# Patient Record
Sex: Male | Born: 1945 | Race: White | Hispanic: No | Marital: Married | Smoking: Never smoker
Health system: Southern US, Community
[De-identification: ages and names within clinical notes are randomized; demographics above are authoritative.]

## PROBLEM LIST (undated history)

## (undated) ENCOUNTER — Emergency Department (HOSPITAL_COMMUNITY): Admission: EM | Payer: Non-veteran care | Source: Home / Self Care

## (undated) DIAGNOSIS — G629 Polyneuropathy, unspecified: Secondary | ICD-10-CM

## (undated) DIAGNOSIS — M4800 Spinal stenosis, site unspecified: Secondary | ICD-10-CM

## (undated) DIAGNOSIS — F4310 Post-traumatic stress disorder, unspecified: Secondary | ICD-10-CM

## (undated) DIAGNOSIS — K8689 Other specified diseases of pancreas: Secondary | ICD-10-CM

## (undated) DIAGNOSIS — Z950 Presence of cardiac pacemaker: Secondary | ICD-10-CM

## (undated) DIAGNOSIS — J449 Chronic obstructive pulmonary disease, unspecified: Secondary | ICD-10-CM

## (undated) DIAGNOSIS — I4891 Unspecified atrial fibrillation: Secondary | ICD-10-CM

## (undated) HISTORY — PX: CHOLECYSTECTOMY: SHX55

---

## 2000-11-14 ENCOUNTER — Encounter: Payer: Self-pay | Admitting: Emergency Medicine

## 2000-11-14 ENCOUNTER — Emergency Department (HOSPITAL_COMMUNITY): Admission: EM | Admit: 2000-11-14 | Discharge: 2000-11-14 | Payer: Self-pay | Admitting: Emergency Medicine

## 2001-07-28 ENCOUNTER — Emergency Department (HOSPITAL_COMMUNITY): Admission: EM | Admit: 2001-07-28 | Discharge: 2001-07-28 | Payer: Self-pay | Admitting: Emergency Medicine

## 2001-08-03 ENCOUNTER — Emergency Department (HOSPITAL_COMMUNITY): Admission: EM | Admit: 2001-08-03 | Discharge: 2001-08-03 | Payer: Self-pay | Admitting: Emergency Medicine

## 2002-09-12 ENCOUNTER — Emergency Department (HOSPITAL_COMMUNITY): Admission: EM | Admit: 2002-09-12 | Discharge: 2002-09-12 | Payer: Self-pay | Admitting: Emergency Medicine

## 2005-02-22 ENCOUNTER — Emergency Department (HOSPITAL_COMMUNITY): Admission: EM | Admit: 2005-02-22 | Discharge: 2005-02-22 | Payer: Self-pay | Admitting: Emergency Medicine

## 2005-02-23 ENCOUNTER — Emergency Department (HOSPITAL_COMMUNITY): Admission: EM | Admit: 2005-02-23 | Discharge: 2005-02-23 | Payer: Self-pay | Admitting: Emergency Medicine

## 2006-01-30 ENCOUNTER — Emergency Department (HOSPITAL_COMMUNITY): Admission: EM | Admit: 2006-01-30 | Discharge: 2006-01-30 | Payer: Self-pay | Admitting: Emergency Medicine

## 2006-09-03 ENCOUNTER — Encounter: Payer: Self-pay | Admitting: Emergency Medicine

## 2006-09-04 ENCOUNTER — Inpatient Hospital Stay (HOSPITAL_COMMUNITY): Admission: EM | Admit: 2006-09-04 | Discharge: 2006-09-06 | Payer: Self-pay | Admitting: Internal Medicine

## 2006-09-04 ENCOUNTER — Encounter (INDEPENDENT_AMBULATORY_CARE_PROVIDER_SITE_OTHER): Payer: Self-pay | Admitting: Internal Medicine

## 2006-09-04 ENCOUNTER — Ambulatory Visit: Payer: Self-pay | Admitting: Vascular Surgery

## 2007-05-06 ENCOUNTER — Ambulatory Visit: Payer: Self-pay | Admitting: Cardiology

## 2007-05-07 ENCOUNTER — Inpatient Hospital Stay (HOSPITAL_COMMUNITY): Admission: EM | Admit: 2007-05-07 | Discharge: 2007-05-11 | Payer: Self-pay | Admitting: Emergency Medicine

## 2007-05-07 ENCOUNTER — Encounter (INDEPENDENT_AMBULATORY_CARE_PROVIDER_SITE_OTHER): Payer: Self-pay | Admitting: Emergency Medicine

## 2007-05-07 ENCOUNTER — Ambulatory Visit: Payer: Self-pay | Admitting: Gastroenterology

## 2007-05-08 ENCOUNTER — Encounter: Payer: Self-pay | Admitting: Cardiology

## 2007-05-09 ENCOUNTER — Ambulatory Visit: Payer: Self-pay | Admitting: Gastroenterology

## 2010-04-19 ENCOUNTER — Inpatient Hospital Stay (HOSPITAL_COMMUNITY)
Admission: RE | Admit: 2010-04-19 | Discharge: 2010-04-24 | DRG: 603 | Disposition: A | Discharge status: Home / Self Care | Payer: Medicare Other | Source: Ambulatory Visit | Attending: Internal Medicine | Admitting: Internal Medicine

## 2010-04-19 ENCOUNTER — Emergency Department (HOSPITAL_COMMUNITY): Payer: Medicare Other

## 2010-04-19 DIAGNOSIS — J4489 Other specified chronic obstructive pulmonary disease: Secondary | ICD-10-CM | POA: Diagnosis present

## 2010-04-19 DIAGNOSIS — N400 Enlarged prostate without lower urinary tract symptoms: Secondary | ICD-10-CM | POA: Diagnosis present

## 2010-04-19 DIAGNOSIS — I4891 Unspecified atrial fibrillation: Secondary | ICD-10-CM | POA: Diagnosis present

## 2010-04-19 DIAGNOSIS — E039 Hypothyroidism, unspecified: Secondary | ICD-10-CM | POA: Diagnosis present

## 2010-04-19 DIAGNOSIS — A4902 Methicillin resistant Staphylococcus aureus infection, unspecified site: Secondary | ICD-10-CM | POA: Diagnosis present

## 2010-04-19 DIAGNOSIS — L02619 Cutaneous abscess of unspecified foot: Principal | ICD-10-CM | POA: Diagnosis present

## 2010-04-19 DIAGNOSIS — M9080 Osteopathy in diseases classified elsewhere, unspecified site: Secondary | ICD-10-CM | POA: Diagnosis present

## 2010-04-19 DIAGNOSIS — E1169 Type 2 diabetes mellitus with other specified complication: Secondary | ICD-10-CM | POA: Diagnosis present

## 2010-04-19 DIAGNOSIS — N179 Acute kidney failure, unspecified: Secondary | ICD-10-CM | POA: Diagnosis present

## 2010-04-19 DIAGNOSIS — G609 Hereditary and idiopathic neuropathy, unspecified: Secondary | ICD-10-CM | POA: Diagnosis present

## 2010-04-19 DIAGNOSIS — M86679 Other chronic osteomyelitis, unspecified ankle and foot: Secondary | ICD-10-CM | POA: Diagnosis present

## 2010-04-19 DIAGNOSIS — E785 Hyperlipidemia, unspecified: Secondary | ICD-10-CM | POA: Diagnosis present

## 2010-04-19 DIAGNOSIS — J449 Chronic obstructive pulmonary disease, unspecified: Secondary | ICD-10-CM | POA: Diagnosis present

## 2010-04-19 DIAGNOSIS — L02419 Cutaneous abscess of limb, unspecified: Secondary | ICD-10-CM | POA: Diagnosis present

## 2010-04-19 DIAGNOSIS — E860 Dehydration: Secondary | ICD-10-CM | POA: Diagnosis present

## 2010-04-19 DIAGNOSIS — L03039 Cellulitis of unspecified toe: Principal | ICD-10-CM | POA: Diagnosis present

## 2010-04-19 LAB — PROTIME-INR: Prothrombin Time: 20.1 seconds — ABNORMAL HIGH (ref 11.6–15.2)

## 2010-04-19 LAB — URINALYSIS, ROUTINE W REFLEX MICROSCOPIC
Ketones, ur: NEGATIVE mg/dL
Leukocytes, UA: NEGATIVE
Nitrite: NEGATIVE
Urine Glucose, Fasting: NEGATIVE mg/dL
pH: 5 (ref 5.0–8.0)

## 2010-04-19 LAB — URINE MICROSCOPIC-ADD ON

## 2010-04-19 LAB — BASIC METABOLIC PANEL
BUN: 37 mg/dL — ABNORMAL HIGH (ref 6–23)
Chloride: 98 mEq/L (ref 96–112)
Creatinine, Ser: 1.95 mg/dL — ABNORMAL HIGH (ref 0.4–1.5)
Glucose, Bld: 241 mg/dL — ABNORMAL HIGH (ref 70–99)
Potassium: 4.1 mEq/L (ref 3.5–5.1)

## 2010-04-19 LAB — DIFFERENTIAL
Basophils Absolute: 0 10*3/uL (ref 0.0–0.1)
Basophils Relative: 0 % (ref 0–1)
Eosinophils Absolute: 0 10*3/uL (ref 0.0–0.7)
Neutro Abs: 8.2 10*3/uL — ABNORMAL HIGH (ref 1.7–7.7)
Neutrophils Relative %: 85 % — ABNORMAL HIGH (ref 43–77)

## 2010-04-19 LAB — CBC
Hemoglobin: 12.4 g/dL — ABNORMAL LOW (ref 13.0–17.0)
Platelets: 173 10*3/uL (ref 150–400)
RBC: 3.74 MIL/uL — ABNORMAL LOW (ref 4.22–5.81)

## 2010-04-20 LAB — COMPREHENSIVE METABOLIC PANEL
ALT: 130 U/L — ABNORMAL HIGH (ref 0–53)
Alkaline Phosphatase: 139 U/L — ABNORMAL HIGH (ref 39–117)
BUN: 29 mg/dL — ABNORMAL HIGH (ref 6–23)
CO2: 24 mEq/L (ref 19–32)
Chloride: 103 mEq/L (ref 96–112)
GFR calc non Af Amer: 45 mL/min — ABNORMAL LOW (ref >60)
Glucose, Bld: 156 mg/dL — ABNORMAL HIGH (ref 70–99)
Potassium: 3.9 mEq/L (ref 3.5–5.1)
Sodium: 137 mEq/L (ref 135–145)
Total Bilirubin: 1.1 mg/dL (ref 0.3–1.2)
Total Protein: 6.2 g/dL (ref 6.0–8.3)

## 2010-04-20 LAB — MAGNESIUM: Magnesium: 2 mg/dL (ref 1.5–2.5)

## 2010-04-20 LAB — DIFFERENTIAL
Eosinophils Relative: 1 % (ref 0–5)
Lymphocytes Relative: 9 % — ABNORMAL LOW (ref 12–46)
Monocytes Absolute: 0.6 10*3/uL (ref 0.1–1.0)
Monocytes Relative: 9 % (ref 3–12)
Neutro Abs: 5.3 10*3/uL (ref 1.7–7.7)

## 2010-04-20 LAB — CBC
HCT: 32.7 % — ABNORMAL LOW (ref 39.0–52.0)
Hemoglobin: 11.2 g/dL — ABNORMAL LOW (ref 13.0–17.0)
MCH: 33.7 pg (ref 26.0–34.0)
MCHC: 34.3 g/dL (ref 30.0–36.0)
MCV: 98.5 fL (ref 78.0–100.0)
RDW: 13.8 % (ref 11.5–15.5)

## 2010-04-20 LAB — GLUCOSE, CAPILLARY: Glucose-Capillary: 177 mg/dL — ABNORMAL HIGH (ref 70–99)

## 2010-04-20 LAB — HEMOGLOBIN A1C: Mean Plasma Glucose: 140 mg/dL — ABNORMAL HIGH (ref <117)

## 2010-04-20 NOTE — H&P (Signed)
NAMEHORALD, Tony [name]                ACCOUNT NO.:  [id number]  MEDICAL RECORD NO.:  [id number]           PATIENT TYPE:  E  LOCATION:  [facility]                          FACILITY:  [facility]  PHYSICIAN:  Talmage Nap, MD  DATE OF BIRTH:  March 18, 1945  DATE OF ADMISSION:  04/19/2010 DATE OF DISCHARGE:  LH                             HISTORY & PHYSICAL   PRIMARY CARE PHYSICIAN:  Unassigned.  History obtainable from the patient.  CHIEF COMPLAINT:  Pain and redness of the right foot of about 3 days' duration.  The patient is a 65 year old very pleasant Caucasian male with history of diabetes mellitus with peripheral neuropathy, atrial fibrillation on anticoagulation with warfarin, ambulates with the help of a scooter and a four-point walker, presenting to the emergency room with pain and redness of the right foot of about 3 days' duration and this was said to be getting progressively worse.  The patient was also said to have had pain and redness of the second digit of the right foot.  This was, however, said to be associated with chills and vomiting.  The patient denied any history of trauma to the right foot.  He denied any history of fall.  He said he had subjective feeling of fever.  He denied any chest pain or shortness of breath.  He denied any diarrhea or hematochezia.  No dysuria or hematuria, but the pain and the redness in the right foot was getting progressively well.Patient had extreme difficulty in trying to ambulate with the help of his walker and subsequently presented to the emergency room to be evaluated.  Past medical history is positive for: 1. Hypertension. 2. Atrial fibrillation. 3. Chronic back pain. 4. Diabetes mellitus. 5. Gout. 6. Hyperlipidemia. 7. Peripheral neuropathy. 8. COPD. 9. Questionable BPH. 10.Hypothyroidism.  PAST SURGICAL HISTORY:  Cholecystectomy.  Preadmission meds include: 1. Warfarin 7.5 Mg p.o. daily. 2. Albuterol inhaler 2 puffs q.6  p.r.n. for shortness of breath. 3. Aspirin 81 mg daily. 4. Finasteride 5 mg p.o. daily. 5. Furosemide 20 mg p.o. daily. 6. Glipizide 2.5 mg p.o. daily. 7. Synthroid 20 mcg p.o. daily. 8. Lisinopril 5 mg p.o. daily. 9. Metoprolol tartrate 25 mg one-quarter of a tablet p.o. q.24 h. 10.Ranitidine 150 mg p.o. b.i.d. 11.Sennosides 8.6 mg 2 tablets b.i.d. for constipation. 12.Capsacin topical 0.25%. 13.Tylenol 500 mg p.o. p.r.n. for pain. 14.Benadryl 25 mg p.o. at bedtime. 15.Hydrocodone/acetaminophen 5/500 one to two tablets p.o. q.6 p.r.n. 16.Ferrous sulfate 325 mg p.o. b.i.d. 17.Gabapentin 100 mg p.o. b.i.d. 18.Metformin 100 mg p.o. b.i.d. 19.Omeprazole 20 mg p.o. at bedtime. 20.Simvastatin 20 mg p.o. at bedtime.  No known allergies.  SOCIAL HISTORY:  Negative for alcohol or tobacco use.  The patient is a retired Administrator, [employer] and dependend completely on disability.  FAMILY HISTORY:  Positive for chronic kidney disease and prostate CA.  REVIEW OF SYSTEMS:  The patient denies any history of headaches.  No blurred vision.  Complained of persistent cough that is irritating and nonproductive of sputum.  No associated chest pain or shortness of breath.  He presently denies any fever, chills, or rigor.  No PND or  orthopnea.  No abdominal discomfort.  No diarrhea or hematochezia.  No dysuria or hematuria.  Complained of pain and swelling of the dorsum of the right foot as well as second digit of the right foot.  No intolerance to heat or cold, and no known psychiatric disorder.  PHYSICAL EXAMINATION:  GENERAL:  Elderly man, moderately dehydrated, not in any obvious respiratory distress. VITAL SIGNS:  Blood pressure is 102/60, pulse is 74, respiratory rate 18, temperature is 98.7. HEENT:  Pupils are reactive to light, and extraocular muscles are intact. NECK:  No jugular venous distention.  No carotid bruit.  No lymphadenopathy. CHEST:  Minimal scattered rhonchi bibasilarly.  Heart sounds are  1 and 2. ABDOMEN:  Soft, nontender.  Liver, spleen, kidney not palpable.  Bowel sounds are positive. EXTREMITIES:  Erythema on dorsum of right foot with swelling as well as tenderness.Swelling/erythema metatarsal second digit of the right foot.  The patient also has a scab on the third digit of the left foot. NEUROPSYCHIATRIC:  Unremarkable. MUSCULOSKELETAL:  Arthritic changes in the knees and feet. SKIN:  Decreased turgor.  LABORATORY DATA:  Initial chemistry showed sodium of 135, potassium of 4.1, chloride of 90 with a bicarb of 25, glucose is 241, BUN is 37, creatinine is 1.95.  Urinalysis unremarkable.  Urine microscopy unremarkable.  Hematological indices showed WBC of 9.6, hemoglobin of 12.4, hematocrit of 36.9, MCV of 98.7 with a platelet count of 173,000, neutrophil is 85% and absolute granulocyte count is 8.2.  Imaging studies done on the patient include x-ray of the right foot which showed septic arthritis of the right great toe.  ADMITTING IMPRESSION: 1. Cellulitis dorsum of the right foot and abscess distal metatarsal,     second digit of the right foot. 2. Scab third digit of the left foot. 3. Dehydration. 4. Prerenal azotemia with chronic renal failure. 5. Diabetes mellitus. 6. Peripheral neuropathy. 7. Hypertension. 8. Atrial fibrillation. 9. Benign prostatic hypertrophy. 10.Chronic obstructive pulmonary disease. 11.Hyperlipidemia. 12.Gout. 13.Hypothyroidism.  Plan is to admit the patient to a general medical floor.  The patient will be rehydrated with half-normal saline IV to go at rate of 120 mL an hour.  He will be started on IV antibiotics i.e vancomycin 1 g IV stat and dosing to be done by pharmacy and also Zosyn 3.375 g IV q.8 hourly. Pain control will be with Dilaudid 2 mg IV q.4 p.r.n.  For atrial fibrillation, the patient will be on warfarin and dosing will be done by pharmacy.  For peripheral neuropathy, the patient will be on gabapentin 100 mg p.o.  b.i.d.  For BPH, finasteride 5 mg p.o. daily, and for COPD the patient will be on albuterol and Atrovent nebs q.6 p.r.n. for shortness of breath.  For hyperlipidemia, the patient will be on Zocor 20 mg p.r.n. at bedtime.  For hypothyroidism, the patient will be on Synthroid p.o. daily.  Blood pressure will be maintained with lisinopril 5 mg p.o. daily.  GI prophylaxis will be Protonix 40 mg IV q.24 and DVT prophylaxis with heparin 5000 units subcu q.8 hourly. Further labs to be ordered on this patient will include blood culture x2 before starting IV antibiotics.  Hemoglobin A1c, PT/PTT, INR will be repeated in a.m.  CBCD, CMP, and magnesium will also be repeated in a.m. Finally, Surgery will be consulted in a.m. for further evaluation of the right foot for possible incision and drainage.  The patient will be followed and evaluated on a daily basis.  Talmage Nap, MD     CN/MEDQ  D:  04/19/2010  T:  04/19/2010  Job:  [id number]  Electronically Signed by Talmage Nap  on 04/20/2010 12:30:05 AM

## 2010-04-21 LAB — GLUCOSE, CAPILLARY
Glucose-Capillary: 133 mg/dL — ABNORMAL HIGH (ref 70–99)
Glucose-Capillary: 154 mg/dL — ABNORMAL HIGH (ref 70–99)
Glucose-Capillary: 216 mg/dL — ABNORMAL HIGH (ref 70–99)

## 2010-04-21 LAB — BASIC METABOLIC PANEL
CO2: 25 mEq/L (ref 19–32)
Glucose, Bld: 173 mg/dL — ABNORMAL HIGH (ref 70–99)
Potassium: 4.3 mEq/L (ref 3.5–5.1)
Sodium: 137 mEq/L (ref 135–145)

## 2010-04-21 LAB — CBC
Hemoglobin: 10.8 g/dL — ABNORMAL LOW (ref 13.0–17.0)
MCH: 33.5 pg (ref 26.0–34.0)
MCV: 100.3 fL — ABNORMAL HIGH (ref 78.0–100.0)
RBC: 3.22 MIL/uL — ABNORMAL LOW (ref 4.22–5.81)

## 2010-04-21 LAB — DIFFERENTIAL
Eosinophils Absolute: 0.2 10*3/uL (ref 0.0–0.7)
Lymphs Abs: 0.6 10*3/uL — ABNORMAL LOW (ref 0.7–4.0)
Monocytes Absolute: 0.6 10*3/uL (ref 0.1–1.0)
Monocytes Relative: 9 % (ref 3–12)
Neutro Abs: 4.6 10*3/uL (ref 1.7–7.7)
Neutrophils Relative %: 77 % (ref 43–77)

## 2010-04-21 LAB — VANCOMYCIN, TROUGH: Vancomycin Tr: 15.7 ug/mL (ref 10.0–20.0)

## 2010-04-22 LAB — BASIC METABOLIC PANEL
CO2: 25 mEq/L (ref 19–32)
Chloride: 109 mEq/L (ref 96–112)
Creatinine, Ser: 1.22 mg/dL (ref 0.4–1.5)
GFR calc Af Amer: >60 mL/min (ref >60)
Sodium: 141 mEq/L (ref 135–145)

## 2010-04-22 LAB — DIFFERENTIAL
Eosinophils Relative: 4 % (ref 0–5)
Lymphocytes Relative: 12 % (ref 12–46)
Lymphs Abs: 0.6 10*3/uL — ABNORMAL LOW (ref 0.7–4.0)
Monocytes Relative: 9 % (ref 3–12)

## 2010-04-22 LAB — CBC
HCT: 33.6 % — ABNORMAL LOW (ref 39.0–52.0)
MCH: 32.8 pg (ref 26.0–34.0)
MCV: 99.4 fL (ref 78.0–100.0)
RDW: 13.5 % (ref 11.5–15.5)
WBC: 4.7 10*3/uL (ref 4.0–10.5)

## 2010-04-22 LAB — WOUND CULTURE: Gram Stain: NONE SEEN

## 2010-04-22 LAB — GLUCOSE, CAPILLARY
Glucose-Capillary: 173 mg/dL — ABNORMAL HIGH (ref 70–99)
Glucose-Capillary: 155 mg/dL — ABNORMAL HIGH (ref 70–99)

## 2010-04-23 LAB — PROTIME-INR
INR: 1.14 (ref 0.00–1.49)
Prothrombin Time: 14.8 seconds (ref 11.6–15.2)

## 2010-04-23 LAB — DIFFERENTIAL
Basophils Absolute: 0 10*3/uL (ref 0.0–0.1)
Basophils Relative: 0 % (ref 0–1)
Eosinophils Relative: 3 % (ref 0–5)
Monocytes Absolute: 0.4 10*3/uL (ref 0.1–1.0)

## 2010-04-23 LAB — GLUCOSE, CAPILLARY
Glucose-Capillary: 145 mg/dL — ABNORMAL HIGH (ref 70–99)
Glucose-Capillary: 225 mg/dL — ABNORMAL HIGH (ref 70–99)
Glucose-Capillary: 131 mg/dL — ABNORMAL HIGH (ref 70–99)

## 2010-04-23 LAB — BASIC METABOLIC PANEL
CO2: 27 mEq/L (ref 19–32)
Calcium: 9.2 mg/dL (ref 8.4–10.5)
Chloride: 104 mEq/L (ref 96–112)
Glucose, Bld: 135 mg/dL — ABNORMAL HIGH (ref 70–99)

## 2010-04-23 LAB — CBC
MCHC: 33.6 g/dL (ref 30.0–36.0)
Platelets: 156 10*3/uL (ref 150–400)
RDW: 13.5 % (ref 11.5–15.5)

## 2010-04-24 LAB — CBC
HCT: 35.4 % — ABNORMAL LOW (ref 39.0–52.0)
MCHC: 33.6 g/dL (ref 30.0–36.0)
MCV: 98.3 fL (ref 78.0–100.0)
Platelets: 192 10*3/uL (ref 150–400)
RDW: 13.3 % (ref 11.5–15.5)
WBC: 7.5 10*3/uL (ref 4.0–10.5)

## 2010-04-24 LAB — CULTURE, BLOOD (ROUTINE X 2)
Culture: NO GROWTH
Culture: NO GROWTH

## 2010-04-24 LAB — PROTIME-INR
INR: 1.13 (ref 0.00–1.49)
Prothrombin Time: 14.7 seconds (ref 11.6–15.2)

## 2010-04-24 LAB — DIFFERENTIAL
Basophils Absolute: 0 10*3/uL (ref 0.0–0.1)
Eosinophils Absolute: 0.2 10*3/uL (ref 0.0–0.7)
Eosinophils Relative: 2 % (ref 0–5)
Lymphocytes Relative: 10 % — ABNORMAL LOW (ref 12–46)
Lymphs Abs: 0.8 10*3/uL (ref 0.7–4.0)
Monocytes Absolute: 0.6 10*3/uL (ref 0.1–1.0)

## 2010-04-24 LAB — BASIC METABOLIC PANEL
BUN: 17 mg/dL (ref 6–23)
Calcium: 9.4 mg/dL (ref 8.4–10.5)
GFR calc non Af Amer: 54 mL/min — ABNORMAL LOW (ref >60)
Glucose, Bld: 141 mg/dL — ABNORMAL HIGH (ref 70–99)

## 2010-04-24 LAB — GLUCOSE, CAPILLARY
Glucose-Capillary: 234 mg/dL — ABNORMAL HIGH (ref 70–99)
Glucose-Capillary: 147 mg/dL — ABNORMAL HIGH (ref 70–99)

## 2010-04-24 NOTE — Consult Note (Signed)
  NAMEHADI, Tony [name]                ACCOUNT NO.:  [id number]  MEDICAL RECORD NO.:  [id number]           PATIENT TYPE:  LOCATION:                                 FACILITY:  PHYSICIAN:  J. Darreld Mclean, M.D. DATE OF BIRTH:  1945/06/03  DATE OF CONSULTATION: DATE OF DISCHARGE:                                CONSULTATION   The patient is seen at the request of Hospitalist.  The patient has right foot pain for 3 days and severe diabetic peripheral neuropathy. He has been followed at the [place].  He was seen at the [place] just several days ago when he had wound care to his toes.  Since then, he developed significant swelling and tenderness of the second toe on the right foot. He has got peripheral neuropathy and has very little sensation and very little pain.  He has got an ulcer developing on the dorsal to medial side of the second toe.  He is angry appearing.  The patient is on Coumadin because of atrial fibrillation.  He has been placed on vancomycin.  The patient denies any trauma.  X-rays show osteolytic lesion of the great toe, distal phalanx, and proximally in the distal portion of the proximal phalanx on the medial side.  The toe itself looks good.  The x-ray looks terrible.  Second toe x-rays did not show any evidence of significant destruction or bony changes to the toe.  He has got significant soft tissue swelling of the second toe.  IMPRESSION:  Chronic osteomyelitis of the right great toe without any evidence of exterior signs.  Second toe has significantly severe cellulitis and infection without involvement of the bone.  We need to await culture reports, continue the vancomycin, continue elevation.  Until his protime returns normal, no debridement could be carried out.  His protime was 17.4 with INR of 1.4 this morning. Basically, he needs to discontinue the antibiotics, elevation, and observation right now.  As far as the great toe, just watch this.  He has no exterior  defect and this has been going on for a long time and I do not want to disturb that up.  We will follow with you.          ______________________________ J. Darreld Mclean, M.D.     JWK/MEDQ  D:  04/20/2010  T:  04/21/2010  Job:  [id number]  Electronically Signed by Darreld Mclean M.D. on 04/24/2010 07:20:24 AM

## 2010-05-01 NOTE — Discharge Summary (Signed)
Tony [name], Tony [name]                ACCOUNT NO.:  [id number]  MEDICAL RECORD NO.:  [id number]           PATIENT TYPE:  I  LOCATION:  A304                          FACILITY:  [facility]  PHYSICIAN:  Hartley Barefoot, MD    DATE OF BIRTH:  [birth year]  DATE OF ADMISSION:  04/19/2010 DATE OF DISCHARGE:  03/06/2012LH                              DISCHARGE SUMMARY   DISCHARGE DIAGNOSES: 1. Right lower extremity cellulitis. 2. Second toe swelling, culture grew methicillin-susceptible     Staphylococcus aureus. 3. Right first toe osteomyelitis. 4. Acute renal failure.  OTHER PAST MEDICAL HISTORY: 1. AFib, rate controlled. 2. Hypothyroidism. 3. COPD, stable. 4. Diabetes. 5. Hyperlipidemia. 6. Peripheral neuropathy. 7. BPH.  PAST SURGICAL HISTORY:  Cholecystectomy.  DISCHARGE MEDICATIONS: 1. Levaquin 750 mg p.o. daily for 10 more days. 2. Albuterol inhaler 2 puffs inhale every 4 hours as needed. 3. Aspirin 81 mg p.o. daily. 4. Calcium carbonate 1 tablet by mouth daily. 5. Capsaicin 0.25% one application topically daily as needed. 6. Ferrous sulfate 325 mg 1 tablet by mouth twice daily. 7. Finasteride 5 mg 1 tablet by mouth daily. 8. Furosemide 20 mg p.o. daily. 9. Gabapentin 100 mg two tablets by mouth daily at bedtime. 10.Glipizide 5 mg 1 tablet by mouth daily. 11.Hydrocodone Tylenol 5/500 mg 1 tablet by mouth every 6 hours as     needed. 12.Levothyroxine 100 mcg 1 tablet by mouth daily. 13.Metformin 1000 mg 1 tablet by mouth twice daily. 14.Metoprolol 25 mg 0.25 tablets p.o. daily. 15.Multivitamins 1 tablet daily. 16.Niacin 500 mg 1 tablet by mouth daily at bedtime. 17.Omeprazole 20 mg 1 capsule by mouth daily at bedtime. 18.Senna 2 tablets by mouth twice daily. 19.Simvastatin 40 mg p.o. daily at bedtime. 20.Warfarin 5 mg 1 tablet by mouth daily.  Following medications were stopped: 1. Ibuprofen 600 mg by mouth daily. 2. Indomethacin 50 mg. 3. Aspirin, caffeine. 4.  Lisinopril.  DISPOSITION AND FOLLOWUP:  Tony [name] will follow with Dr. Darreld Mclean on Friday at 8:30 a.m.  He will need also to follow up with Dr. Olena Mater within 1 week.  During his appointment with Dr. Deniece Portela, we will need to consider to continue his antibiotics course, and he will need likely refill for antibiotics if needed.and further evaluation for 1 toe osteomyelitis.  RADIOGRAPHIC STUDIES:  Right foot x-ray showed destructive process is seen at the interphalangeal joint of the great toe affecting the proximal and distal phalanx on either side of the joint.  Small bites of bone are seen centrally within large cavity, divided in the distal phalanx consistent with septic arthritis and great toe osteomyelitis. There is surrounding soft tissue swelling.  No other areas of septic arthritis are definitely identified.  IMPRESSION:  Finding consistent with septic arthritis of the right great toe.  CONSULTANT:  Dr. Darreld Mclean, Orthopedics.  BRIEF HISTORY OF PRESENT ILLNESS:  A 65 year old with past medical history of diabetes, peripheral neuropathy, atrial fibrillation on anticoagulation ambulates with a help of a scooter and a four-point walker presents to the emergency department complaining of redness of his right foot for about 3 days of duration  prior to admission.  He relates that the swelling and redness is progressively getting worse. He is having redness and pain of the second digit of his right foot. This is being associated with chills and vomiting.  HOSPITAL COURSE: 1. Right lower extremity cellulitis, cellulitis of the second toe and     significant swelling.  The patient was admitted.  He was started on     vancomycin and Zosyn.  Culture was obtained and grew methicillin-     susceptible Staphylococcus aureus.  It was sensitive to Levaquin.     After culture and identification of the organism, vancomycin and     Zosyn was stopped.  The patient was transitioned to  Levaquin.  The     patient will be discharged on 10 more days of Levaquin.  He will     need to follow up with Dr. Hilda Lias for further care.  Dr. Hilda Lias     was following the patient in the hospital.  Initially, the     debridement was not done because the patient's INR was mildly     elevated.  Then, over course of hospitalization, swelling and     redness of the second right toe decreased and improved.  For this     reason, the patient did not require any debridement.  The patient     will follow with Dr. Hilda Lias on this Friday at 8:30 a.m. 2. Chronic osteomyelitis of the right great toe without any evidence     of exterior signs.  It was decided not to do any intervention at     this time.  The patient will need to follow with Dr. Hilda Lias for     further care of chronic osteomyelitis.  We will need to consider     prolonged course of antibiotics, but that will need to be     determined after the patient is followed with Dr. Hilda Lias. 3. Acute renal failure, prerenal secondary to volume depletion     secondary to vomiting.  Lisinopril was stopped and initially Lasix     was also discontinued.  The patient received IV fluids.  His kidney     function significantly improved from creatinine level at 1.9 on     admission decreased to 1.3 the day of discharge, April 24, 2010.     The patient was restarted on Lasix and his creatinine remained     stable.  He will need to follow with his primary care physician and     will need to consider restart his lisinopril. 4. AFib, rate controlled.  Initially, Coumadin was on hold     anticipating surgery intervention.  Eventually Coumadin was     restarted because the patient did not require any surgical     intervention.  He will be discharged on 5 mg p.o. daily.  He will     need to follow up with his primary care physician for further     adjustment.  We will continue with the same dose of metoprolol. 5. All his other chronic medical problems  remained stable and we will     continue with his current medications.  On the day of discharge, the patient was in improved condition.  His pulse was 76, respirations 16, blood pressure 117/72, sats 98 on room air, temperature 98.3.  DISCHARGE LABORATORIES:  Blood cultures negative.  Sodium 135, potassium 4.5, chloride 101, bicarb 25, glucose 141, BUN 17, creatinine 1.3. White blood cells 7.5,  hemoglobin 11.9, hematocrit 35.4, platelets 192, INR 1.1.  He had one culture that showed staph aureus MSSA sensitive to Levaquin.     Hartley Barefoot, MD     BR/MEDQ  D:  04/24/2010  T:  04/25/2010  Job:  [id number]  cc:   Teola Bradley, M.D. Fax: [phone]  Olena Mater, MD [facility]  Electronically Signed by Hartley Barefoot MD on 05/01/2010 09:46:53 AM

## 2010-05-10 ENCOUNTER — Inpatient Hospital Stay (HOSPITAL_COMMUNITY)
Admission: RE | Admit: 2010-05-10 | Discharge: 2010-05-12 | DRG: 554 | Disposition: A | Discharge status: Home / Self Care | Payer: Non-veteran care | Source: Ambulatory Visit | Attending: Internal Medicine | Admitting: Internal Medicine

## 2010-05-10 DIAGNOSIS — K219 Gastro-esophageal reflux disease without esophagitis: Secondary | ICD-10-CM | POA: Diagnosis present

## 2010-05-10 DIAGNOSIS — N179 Acute kidney failure, unspecified: Secondary | ICD-10-CM | POA: Diagnosis present

## 2010-05-10 DIAGNOSIS — N189 Chronic kidney disease, unspecified: Secondary | ICD-10-CM | POA: Diagnosis present

## 2010-05-10 DIAGNOSIS — M109 Gout, unspecified: Principal | ICD-10-CM | POA: Diagnosis present

## 2010-05-10 DIAGNOSIS — I4891 Unspecified atrial fibrillation: Secondary | ICD-10-CM | POA: Diagnosis present

## 2010-05-10 DIAGNOSIS — E039 Hypothyroidism, unspecified: Secondary | ICD-10-CM | POA: Diagnosis present

## 2010-05-10 DIAGNOSIS — Z8614 Personal history of Methicillin resistant Staphylococcus aureus infection: Secondary | ICD-10-CM

## 2010-05-10 DIAGNOSIS — E119 Type 2 diabetes mellitus without complications: Secondary | ICD-10-CM | POA: Diagnosis present

## 2010-05-10 DIAGNOSIS — J449 Chronic obstructive pulmonary disease, unspecified: Secondary | ICD-10-CM | POA: Diagnosis present

## 2010-05-10 DIAGNOSIS — J4489 Other specified chronic obstructive pulmonary disease: Secondary | ICD-10-CM | POA: Diagnosis present

## 2010-05-10 DIAGNOSIS — I129 Hypertensive chronic kidney disease with stage 1 through stage 4 chronic kidney disease, or unspecified chronic kidney disease: Secondary | ICD-10-CM | POA: Diagnosis present

## 2010-05-10 DIAGNOSIS — G609 Hereditary and idiopathic neuropathy, unspecified: Secondary | ICD-10-CM | POA: Diagnosis present

## 2010-05-10 LAB — DIFFERENTIAL
Basophils Absolute: 0 10*3/uL (ref 0.0–0.1)
Basophils Relative: 0 % (ref 0–1)
Lymphocytes Relative: 19 % (ref 12–46)
Monocytes Absolute: 0.6 10*3/uL (ref 0.1–1.0)
Monocytes Relative: 13 % — ABNORMAL HIGH (ref 3–12)
Neutro Abs: 2.9 10*3/uL (ref 1.7–7.7)
Neutrophils Relative %: 62 % (ref 43–77)

## 2010-05-10 LAB — BASIC METABOLIC PANEL
CO2: 22 mEq/L (ref 19–32)
Chloride: 105 mEq/L (ref 96–112)
GFR calc Af Amer: 44 mL/min — ABNORMAL LOW (ref >60)
Glucose, Bld: 150 mg/dL — ABNORMAL HIGH (ref 70–99)
Sodium: 137 mEq/L (ref 135–145)

## 2010-05-10 LAB — CBC
HCT: 35 % — ABNORMAL LOW (ref 39.0–52.0)
Hemoglobin: 11.8 g/dL — ABNORMAL LOW (ref 13.0–17.0)
RBC: 3.6 MIL/uL — ABNORMAL LOW (ref 4.22–5.81)

## 2010-05-11 DIAGNOSIS — I059 Rheumatic mitral valve disease, unspecified: Secondary | ICD-10-CM

## 2010-05-11 LAB — BASIC METABOLIC PANEL
Calcium: 8.2 mg/dL — ABNORMAL LOW (ref 8.4–10.5)
Creatinine, Ser: 1.51 mg/dL — ABNORMAL HIGH (ref 0.4–1.5)
GFR calc Af Amer: 57 mL/min — ABNORMAL LOW (ref >60)
GFR calc non Af Amer: 47 mL/min — ABNORMAL LOW (ref >60)
Sodium: 136 mEq/L (ref 135–145)

## 2010-05-11 LAB — GLUCOSE, CAPILLARY
Glucose-Capillary: 242 mg/dL — ABNORMAL HIGH (ref 70–99)
Glucose-Capillary: 323 mg/dL — ABNORMAL HIGH (ref 70–99)
Glucose-Capillary: 330 mg/dL — ABNORMAL HIGH (ref 70–99)
Glucose-Capillary: 269 mg/dL — ABNORMAL HIGH (ref 70–99)

## 2010-05-11 LAB — PROTIME-INR
INR: 2.23 — ABNORMAL HIGH (ref 0.00–1.49)
Prothrombin Time: 24.8 seconds — ABNORMAL HIGH (ref 11.6–15.2)

## 2010-05-12 LAB — LIPID PANEL
LDL Cholesterol: 71 mg/dL (ref 0–99)
Total CHOL/HDL Ratio: 4.3 RATIO
Triglycerides: 121 mg/dL (ref <150)
VLDL: 24 mg/dL (ref 0–40)

## 2010-05-12 LAB — CBC
HCT: 33.3 % — ABNORMAL LOW (ref 39.0–52.0)
MCV: 97.7 fL (ref 78.0–100.0)
RBC: 3.41 MIL/uL — ABNORMAL LOW (ref 4.22–5.81)
WBC: 7.3 10*3/uL (ref 4.0–10.5)

## 2010-05-12 LAB — DIFFERENTIAL
Eosinophils Relative: 0 % (ref 0–5)
Lymphocytes Relative: 8 % — ABNORMAL LOW (ref 12–46)
Lymphs Abs: 0.6 10*3/uL — ABNORMAL LOW (ref 0.7–4.0)

## 2010-05-12 LAB — BASIC METABOLIC PANEL
CO2: 23 mEq/L (ref 19–32)
Chloride: 105 mEq/L (ref 96–112)
Creatinine, Ser: 1.24 mg/dL (ref 0.4–1.5)
GFR calc Af Amer: >60 mL/min (ref >60)
Glucose, Bld: 206 mg/dL — ABNORMAL HIGH (ref 70–99)

## 2010-05-12 LAB — PROTIME-INR: INR: 2.2 — ABNORMAL HIGH (ref 0.00–1.49)

## 2010-05-12 NOTE — H&P (Signed)
Tony [name], Tony [name]                ACCOUNT NO.:  [id number]  MEDICAL RECORD NO.:  [id number]           PATIENT TYPE:  E  LOCATION:  [facility]                          FACILITY:  [facility]  PHYSICIAN:  Houston Siren, MD           DATE OF BIRTH:  11/17/1945  DATE OF ADMISSION:  05/10/2010 DATE OF DISCHARGE:  LH                             HISTORY & PHYSICAL   PRIMARY CARE PHYSICIAN:  Unassigned.  The patient goes to the [place] for his care.  ADVANCE DIRECTIVE:  Full code.  REASON FOR ADMISSION:  Bilateral hand swelling and redness.  HISTORY OF PRESENT ILLNESS:  This is a 65 year old male with multiple medical problems including diabetes, chronic atrial fibrillation on anticoagulation, history of gout, hypothyroidism, peripheral vascular disease, hypertension recently admitted for right foot septic arthritis and cellulitis, treated with vancomycin and Zosyn followed by Levaquin presents back to the emergency room with bilateral hand swelling and pain.  Apparently he stopped his allopurinol and colchicine and because of the increasing pain and redness, he started colchicine up to 3 tablets t.i.d. without significant improvement.  He has pain and unable to drive because of his hand pain.  He denies any fever or chills. Evaluation in emergency room show hypotension with systolic blood pressure of 90.  His blood pressure usually runs in the 100-110 in the past as I reviewed his records.  He also has a normal white count of 4700, hemoglobin of 11.8.  His creatinine is 1.8 as he has chronic renal insufficiency and on last admission it was 1.95.  His blood glucose is 150.  He was started on vancomycin and Dilaudid in the emergency room, had some improvement, but not having complete relief and hospitalist was asked to admit the patient for treatment of cellulitis and pain.  PAST MEDICAL HISTORY: 1. Hypertension. 2. Atrial fibrillation. 3. Chronic back pain. 4. Diabetes. 5. Gout. 6. Hyperlipidemia. 7.  Peripheral neuropathy. 8. COPD. 9. BPH. 10.Hypothyroidism. 11.Chronic renal insufficiency.  PAST SURGICAL HISTORY:  Status post cholecystectomy.  MEDICATIONS:  This is taken from discharge summary of April 25, 2010. 1. Albuterol inhaler as needed. 2. Aspirin 81 mg per day. 3. Calcium carbonate 1 tablet p.o. per day. 4. Capsaicin. 5. Ferrous sulfate 325 mg b.i.d. 6. Proscar 5 mg p.o. daily. 7. Lasix 20 mg per day. 8. Gabapentin 100 mg 2 tablets p.o. at bedtime. 9. Glipizide 5 mg 1 tablet p.o. daily. 10.Pain medication hydrocodone. 11.Thyroxine 100 mcg per day. 12.Metformin 1000 mg b.i.d. 13.Metoprolol 25 mg per day. 14.Multivitamin. 15.Niacin. 16.Prilosec 20 mg per day. 17.Senna. 18.Simvastatin 40 mg at bedtime. 19.Coumadin.  REVIEW OF SYSTEMS:  He has no chest pain or shortness of breath, nausea, vomiting, fever, or chills.  ALLERGIES:  No known drug allergies.  SOCIAL HISTORY:  No tobacco or alcohol use.  He is a retired and is on disability.  He is married.  His wife's take care of him.  FAMILY HISTORY:  Chronic kidney disease and prostate cancer.  PHYSICAL EXAMINATION:  GENERAL:  Elderly man not in any obvious distress.  He is morbidly obese.  He is alert and oriented and conversing in no apparent distress. VITAL SIGNS:  Blood pressure 94/50, pulse of 80, respiratory rate of 17, temperature 98.5. SKIN:  Warm and dry with good peripheral perfusion. HEENT:  Throat is clear.  No stridor. CARDIAC:  S1 and S2 regular. LUNGS:  Clear. ABDOMEN:  Quite obese, nondistended, nontender. EXTREMITIES:  No edema.  He has some crusty lesions on his right toe, especially the second one.  Proximally on his right mid lower leg, there is lesion consistent with psoriatic dermatitis.  He had no calf tenderness.  His hand show bilateral swelling with tophaceous formation and swelling of his joint bilaterally with no significant erythema of his skin, but there is some redness and it  is tender. PSYCHIATRIC:  Unremarkable. NEUROLOGIC:  Nonfocal.  OBJECTIVE FINDINGS:  White count 4700, hemoglobin 11.8, platelet count of 146,000.  Creatinine 1.87, glucose 150, potassium 4.6.  Uric acid is not available.  IMPRESSION:  This is a 65 year old male with history of cellulitis and recent septic arthritis, diabetes, chronic atrial fibrillation on anticoagulation, history of gout with recent gout medication adjustment, hypothyroidism, hypertension, presented with bilateral hand swelling, suspicious for gout.  I suspect this is rather gouty arthritis than cellulitis or septic arthritis.  He is a little bit hypotensive, but his blood pressure previously was borderline low as well.  We will give intravenous fluids carefully as he has history of congestive heart failure.  I will treat him with adjusted dose for renal insufficiency with allopurinol, colchicine, and we will give Solu-Medrol as well.  We will check uric acid, which I suspect will be elevated.  He may not be able to tolerate Lasix, as it would precipitate gout.  We will continue with vancomycin and Zosyn for now and continue with his other medications.  There are some medications I would like to stop, which includes metformin because of his elevated creatinine.  This is a relative contraindication.  We will stop the Lasix because of his history of gout and renal insufficiency.  I will also stop the niacin because of the AIM-HIGH trial indicating niacin shows no clinical benefits.  For his atrial fibrillation, we will continue anticoagulation.  He is a full code and will be admitted to [facility] one.  We will get blood culture along with an echo to rule out endocarditis, which I am doubtful, but important to exclude.  We will admit him to telemetry as he has history of atrial fibrillation.     Houston Siren, MD     PL/MEDQ  D:  05/11/2010  T:  05/11/2010  Job:  [id number]  Electronically Signed by Houston Siren  on  05/12/2010 09:50:32 PM

## 2010-05-15 ENCOUNTER — Emergency Department (HOSPITAL_COMMUNITY)
Admission: EM | Admit: 2010-05-15 | Discharge: 2010-05-15 | Disposition: A | Discharge status: Home / Self Care | Payer: Non-veteran care | Attending: Emergency Medicine | Admitting: Emergency Medicine

## 2010-05-15 DIAGNOSIS — I4891 Unspecified atrial fibrillation: Secondary | ICD-10-CM | POA: Insufficient documentation

## 2010-05-15 DIAGNOSIS — E785 Hyperlipidemia, unspecified: Secondary | ICD-10-CM | POA: Insufficient documentation

## 2010-05-15 DIAGNOSIS — Z79899 Other long term (current) drug therapy: Secondary | ICD-10-CM | POA: Insufficient documentation

## 2010-05-15 DIAGNOSIS — E119 Type 2 diabetes mellitus without complications: Secondary | ICD-10-CM | POA: Insufficient documentation

## 2010-05-15 DIAGNOSIS — M869 Osteomyelitis, unspecified: Secondary | ICD-10-CM | POA: Insufficient documentation

## 2010-05-15 DIAGNOSIS — Z7901 Long term (current) use of anticoagulants: Secondary | ICD-10-CM | POA: Insufficient documentation

## 2010-05-16 LAB — CULTURE, BLOOD (ROUTINE X 2)

## 2010-05-21 NOTE — Discharge Summary (Signed)
NAMEPEDRO, Tony [name]                ACCOUNT NO.:  [id number]  MEDICAL RECORD NO.:  [id number]           PATIENT TYPE:  I  LOCATION:  A335                          FACILITY:  [facility]  PHYSICIAN:  Wilson Singer, M.D.DATE OF BIRTH:  05/29/1945  DATE OF ADMISSION:  05/10/2010 DATE OF DISCHARGE:  03/24/2012LH                              DISCHARGE SUMMARY   The patient is unassigned.  The patient's primary care physician is a gentleman by the name of Dr. Olena Mater at [facility].  FINAL DISCHARGE DIAGNOSES: 1. Bilateral hand gout flare. 2. Chronic atrial fibrillation. 3. Diabetes. 4. Hypothyroidism. 5. Hypertension. 6. Recent right lower extremity cellulitis and methicillin-susceptible     Staphylococcus aureus cellulitis with right big toe osteomyelitis. 7. Acute on chronic kidney disease. 8. chronic obstructive pulmonary disease stable.  MEDICATIONS ON DISCHARGE: 1. Allopurinol 100 mg b.i.d. 2. Reducing course of prednisone 40 mg daily for 3 days, 20 mg daily     for 3 days, 10 mg daily for 3 days and then discontinue. 3. Albuterol inhaler 2 puffs every 4 hours p.r.n. 4. Aspirin enteric-coated 81 mg daily. 5. Calcium plus vitamin D 1 tablet daily. 6. Capzasin application topically as needed. 7. Ferrous sulfate 325 mg daily. 8. Finasteride 5 mg daily. 9. Furosemide 20 mg daily. 10.Gabapentin 100 mg 2 tablets nightly. 11.Glipizide XL 2.5 mg daily. 12.Hydrocodone/APAP 5/500 two tablets nightly p.r.n. 13.Indomethacin 50 mg 2 tablets nightly p.r.n. 14.Levothyroxine 200 mcg daily. 15.Lisinopril 10 mg daily. 16.Metformin 1000 mg b.i.d. 17.Metoprolol 25 mg daily. 18.Multivitamins 1 tablet daily. 19.Niacin 1 tablet nightly. 20.Omeprazole 20 mg nightly. 21.Razadyne 150 mg daily as needed. 22.Senokot 1 tablet daily as needed. 23.Simvastatin 40 mg nightly. 24.Warfarin 5 mg daily. 25.Discontinue Augmentin  CONDITION ON DISCHARGE:  Stable.  HISTORY:  This pleasant  veteran of 65 years old was admitted with bilateral hand swelling and redness.  Please see initial history and physical examination by Dr. Houston Siren.  HOSPITAL PROGRESS:  Dr. Theron Arista Le's assessment was this in fact was gout rather than cellulitis and I agree with him.  He was started on intravenous steroids as well as colchicine and allopurinol and he has made a significant improvement in the last couple of days.  He is able to pick things up with his hands now and the redness and swelling resolving rather rapidly.  He also had some sort of fever and murmur and an echocardiogram was done in the hospital which showed normal systolic function with ejection fraction of 50-55% with no evidence of vegetations.  Today, he is feeling well and hands are less swollen.  On physical examination; temperature 98.4, blood pressure 124/77, pulse 68, saturation 100% on room air.  He hands certainly are swollen at the metacarpophalangeal joints but they appear to be not warm.  Heart sounds are present and normal in an atrial fibrillation.  Lung fields are clear.  Blood work today shows hemoglobin 11.2, white blood cell count 7.3, platelets 162, INR therapeutic at 2.2, BUN coming down at 28, creatinine normal at 1.24, sodium 135, potassium 4.4, bicarbonate 23.  DISPOSITION:  The patient is  stable now to be discharged home on reducing course of prednisone and I have given him prescription for this.  He already has colchicine and indomethacin at his disposal to use as needed.  He will need to follow up with his primary care physician at the [facility] in [place] in the next week or two.     Wilson Singer, M.D.     NCG/MEDQ  D:  05/12/2010  T:  05/12/2010  Job:  [id number]  cc:   Olena Mater [facility]  Electronically Signed by Lilly Cove M.D. on 05/21/2010 01:31:05 PM

## 2010-07-03 NOTE — Consult Note (Signed)
Tony [name], Tony [name]                ACCOUNT NO.:  [id number]   MEDICAL RECORD NO.:  [id number]          PATIENT TYPE:  INP   LOCATION:  A212                          FACILITY:  [facility]   PHYSICIAN:  Ky Barban, M.D.DATE OF BIRTH:  November 02, 1945   DATE OF CONSULTATION:  DATE OF DISCHARGE:                                 CONSULTATION   CHIEF COMPLAINT:  Gross hematuria.   HISTORY:  A 65 year old gentleman primarily admitted with a diagnosis of  acute pancreatitis.  He is feeling better as far as abdominal pain is  concerned but during his hospital stay it was seen that he had passed  blood in his urine.  The patient said that he has not seen any blood.  He has no voiding problem but he has been diagnosed as BPH but has no  symptoms of BPH.  He denies having any urological complaints in the  past.  He was on Coumadin because of atrial fibrillation, which he  thinks he was told that happened because he was on Synthroid and now the  Coumadin has been discontinued.   PAST MEDICAL HISTORY:  Includes a history of hypertension, type 2  diabetes, gout, atrial fibrillation, hyperlipidemia, hypothyroidism,  arthritis and peripheral neuropathy.  The patient says that he was in  the [place] war and since then he has developed weakness in his  extremities which he was told is because of peripheral neuropathy.  Probably he says he was exposed to [employer].  He walks with the help  of a walker.   PHYSICAL EXAMINATION:  GENERAL:  On examination moderately built, not in  acute distress, fully conscious, alert, oriented.  VITAL SIGNS:  Blood pressure is 110/48, temperature is normal.  ABDOMEN:  Soft, flat.  Liver, spleen, kidneys are not palpable.  GU:  External genitalia is unremarkable.  RECTAL EXAM:  Normal sphincter tone.  No rectal mass, prostate 1-1/2+  smooth and firm.  No rectal mass.   IMPRESSION:  1. History of gross hematuria.  2. History of atrial fibrillation, on Coumadin.  3.  Diabetes type 2.  4. Hypertension.  5. Peripheral neuropathy.  6. Possible acute pancreatitis.   RECOMMENDATIONS:  Recommend the patient, if he does not have any gross  hematuria, if needs to be, can be restarted on his Coumadin with  carefully watching his INR.  He will need further urological workup.  He  is already scheduled to have a CT of the abdomen with and without  contrast and we will go ahead and order urine cytologies and PSA.  He  will need to have a cystoscopy.  I told the patient that it can be done  in the office upon his discharge,  and I will leave my telephone number on the discharge papers.  He can  come, and because he goes to the [facility] hospital, he has a choice if he  wants to go there or come to my office.  Now he also told me that one of  his younger brother has prostate cancer, so will check his PSA also.  Ky Barban, M.D.  Electronically Signed     MIJ/MEDQ  D:  05/07/2007  T:  05/07/2007  Job:  [id number]

## 2010-07-03 NOTE — Discharge Summary (Signed)
Tony [name], Tony [name]                ACCOUNT NO.:  [id number]   MEDICAL RECORD NO.:  [id number]          PATIENT TYPE:  INP   LOCATION:  3703                         FACILITY:  [facility]   PHYSICIAN:  Altha Harm, MDDATE OF BIRTH:  07/31/1945   DATE OF ADMISSION:  09/04/2006  DATE OF DISCHARGE:  09/06/2006                               DISCHARGE SUMMARY   DISCHARGE DISPOSITION:  Home.   FINAL DISCHARGE DIAGNOSES:  1. Paroxysmal atrial fibrillation.  2. Hypotension, resolved.  3. Suspected [place] spotted fever.  Cultures negative to date.  4. Hypothyroidism.  5. Diabetes type 2.  6. Benign prosthetic hypertrophy.  7. History of gout.  8. History of gastroesophageal reflux disease.  9. History of spinal stenosis, wheelchair dependent.  10.Diabetic neuropathy.  11.Thrombocytopenia, resolved.   DISCHARGE MEDICATIONS:  New medications:  1. Cardizem CD 30 mg p.o. every eight hours.  2. Doxycycline 100 mg p.o. b.i.d. x7 days.   Continued medications:  1. Lisinopril 5 mg p.o. daily.  2. Simvastatin 40 mg p.o. daily.  3. Synthroid 175 mcg p.o. daily.  4. Metformin 1000 mg p.o. daily.  5. Allopurinol 100 mg p.o. daily.  6. Senolax 4 pills p.o. daily.  7. Flomax 0.4 mg p.o. q.h.s.  8. Aspirin 81 mg p.o. daily.  9. Cimetidine 400 mg p.o. q.h.s.  10.Neurontin 100 mg p.o. t.i.d.  11.Glipizide 10 mg p.o. daily.  12.Colace 100 mg p.o. b.i.d.  13.Vicodin 1 tab p.o. every four hours p.r.n.  14.Proscar 5 mg p.o. daily.   DIAGNOSTIC STUDIES:  1. A VQ scan which shows low probability for pulmonary embolus.  2. Chest x-ray 2-views which shows no acute cardiopulmonary process.   Blood cultures were performed and so far they are negative x48 hours.  Final results still pending.   Urine culture negative to date, final result.   CODE STATUS:  FULL CODE.   PR[facility]IC[facility] [place] [name].   CHIEF COMPLAINT:  Hypotension and  tachycardia.   HISTORY OF PRESENT ILLNESS:  Please see H and P dictated by Dr. Eda Paschal  for details of the HPI.   HOSPITAL COURSE:  1. Suspected sepsis with suspected [facility] spotted fever.  The      patient was admitted and started on ceftriaxone and doxycycline for      suspicion of [facility] spotted fever.  The patient was      supported with IV fluids which corrected his blood pressure and      brought him into normotensive range.  The patient did not appear at      all toxic or septic appearing during his hospital stay.  He      recovered his blood pressures with only fluid support.  The fluids      are discontinued and the patient is able to maintain his blood      pressures without difficulty.  I feel that the patient actually had      hemodynamic instability more than likely secondary to      multifactorial etiology including possible  infection, undetermined      at this time.  Atrial fibrillation with rapid ventricular response      and a decreased oral intake.  The patient's atrial fibrillation      with rapid ventricular response was treated, and again, the patient      was able to maintain his blood pressures.  In light of the fact      that the patient improved on doxycycline, the patient will be      continued on a course of 7 days of doxycycline and is to follow up      with his primary care physician.  The patient had no rashes      characteristic of [facility] spotted fever, and had no real      significant fevers since his arrival here to the hospital.  2. Atrial fibrillation with rapid ventricular response.  The patient      stated that he is known to have atrial fibrillation that goes in      and out of a normal rhythm according to him.  This constitutes      paroxysmal atrial fibrillation, and the patient had a discussion      about the use of Coumadin in this situation.  The patient states      that he had had a lengthy discussion with his primary  care      physician before regarding this issue, and had done his own      research and reading on this.  Resulting from the discussion and      his research, he felt that at this time he did not want to go on      Coumadin despite the risk of CV anticipation.  The patient was      treated with Cardizem with controlled rate and the patient is being      sent home on Cardizem short-acting 30 mg p.o. every 8 hours and      this can be further titrated by his primary care physician.  3. Diabetes type 2.  The patient had good diabetic control while      hospitalized and is being continued on his Glucophage and      glipizide.  4. Hypothyroidism.  The patient had a TSH done while hospitalized      which showed him in a euthyroid state and he is being continued on      his usual dose of Synthroid at 175 mcg daily.  5. Use of Lisinopril.  Please note that the patient's use of      Lisinopril is primarily for renal protection associated with his      diabetes, but the patient states he has never had a diagnosis of      hypertension prior to the use of Lisinopril.  6. History of gout.  The patient is being continued on his      allopurinol.  7. Gastroesophageal reflux disease.  He is being continued on his      cimetidine.  8. Hyperlipidemia.  The patient is being continued on the simvastatin.   The patient is otherwise stable at the time of discharge.  Vital signs  are stable.  Blood pressure today is 118/79, with a heart rate of 71,  respiratory rate is 16, and a temperature of 98.1 orally.  His O2  saturations on room air are 100%.  Clinically the patient is stable for  discharge and is to follow up with  his primary care doctor at the [facility]  medical center in approximately 3 to 5 days.   DIETARY RESTRICTIONS:  The patient should be on a diabetic heart healthy  diet.   PHYSICAL RESTRICTIONS:  The patient is wheelchair dependent with an  electric wheelchair and will resume his mobility using  his wheelchair.      Altha Harm, MD  Electronically Signed     MAM/MEDQ  D:  09/06/2006  T:  09/07/2006  Job:  [id number]

## 2010-07-03 NOTE — Group Therapy Note (Signed)
[name], [name]                ACCOUNT NO.:  [id number]   MEDICAL RECORD NO.:  [id number]          PATIENT TYPE:  INP   LOCATION:  A212                          FACILITY:  [facility]   PHYSICIAN:  Tony Singh, DO    DATE OF BIRTH:  [birth year]   DATE OF PROCEDURE:  DATE OF DISCHARGE:                                 PROGRESS NOTE   HISTORY OF PRESENT ILLNESS:  I was called about the patient's heart rate  last night.  He was started on a Cardizem drip.  His heart rate was  going up.  However, his blood pressure was going down when his heart  rate was better controlled.  Went ahead and discontinued the Cardizem  drip and gave him boluses of fluid to help with his blood pressure.  He  was seen by GI in the morning and by Urology.  The patient had urologic  workup.  Urology recommended that he have this done outpatient when he  is discharged from the hospital.  GI saw him and there is some concern  about cholecystitis with a dilated CBD complicated by acute  pancreatitis.  Surgery consult has been obtained.  Unasyn has been  added, and his Coumadin has also been held as well.  Dr. Lovell [name] will  see the patient in the morning and will probably take him to the OR  tomorrow.  For his atrial fibrillation, Cardiology has been consulted as  well.   PHYSICAL EXAMINATION:  VITAL SIGNS:  Temperature is not done,  respirations 14, pulse 20, blood pressure 88/50.  GENERAL:  The patient is a 65 year old male who is well-developed, well-  nourished in no acute distress.  HEART:  Regular rate and rhythm.  LUNGS:  Clear to auscultation bilaterally.  ABDOMEN:  Soft, nontender, nondistended.  EXTREMITIES:  Positive pulses.  No edema or cyanosis noted.   LABORATORY DATA:  Labs for this morning, white count of 6.1, hemoglobin  of 8.8, hematocrit 26.0 and his platelet count of 113, sodium 133,  potassium 4.2, chloride 106, CO2 28, glucose 150, BUN 25 and creatinine  1.83, uric acid is 9.4.   ASSESSMENT/PLAN:  1. Cholecystitis.  Surgery has been consulted, so await any further      recommendations they may have regarding surgical options.  2. Pancreatitis.  The patient is improving clinically and will      continue to monitor him as well.  GI is also monitoring patient.      Await any further recommendations they may have.  3. Atrial fibrillation.  He seems to be under control and is converted      to normal sinus rhythm.  He was taken off of the a Cardizem drip      and we will have Cardiology come to see him.  The patient is unsure      of the medication that he is taking.  I suspect his Cardizem is      some type of calcium channel blocker, but will go ahead and see if      we can [facility] and [facility] the proper      dose.  Will go ahead and start him on since he is controlled.  Will      have Cardiology make recommendations on what they would like to      start him on.  4. Hematuria.  This will be followed outpatient and due to the fact      that his hemoglobin is not stable, will continue to hold all      anticoagulants at this point in time.  Will also do a transfusion      order if his hemoglobin dips below 8.0. Will continue to monitor      the patient and make changes as necessary.      Tony Singh, DO  Electronically Signed     CB/MEDQ  D:  05/08/2007  T:  05/08/2007  Job:  [id number]

## 2010-07-03 NOTE — H&P (Signed)
Tony [name], Tony [name]                ACCOUNT NO.:  [id number]   MEDICAL RECORD NO.:  [id number]          PATIENT TYPE:  INP   LOCATION:  A332                          FACILITY:  [facility]   PHYSICIAN:  Dorris Singh, DO    DATE OF BIRTH:  1945-12-31   DATE OF ADMISSION:  05/06/2007  DATE OF DISCHARGE:  LH                              HISTORY & PHYSICAL   HISTORY OF PRESENT ILLNESS:  The patient is a 65 year old male who  presented to the [facility] emergency room  with a chief complaint of  abdominal pain.  Upon admission, he was found to have acute  pancreatitis.  His history of present illness states that the pain  started two days ago, it was acute in nature.  It was rated an 8/10 at  its worst.  It had been associated with nausea and vomiting and really  there was nothing that relieved it at all.  It was located mostly mid  epigastric region.   PAST MEDICAL HISTORY:  1. Atrial fibrillation.  2. Hypertension.  3. Arthritis.  4. Chronic right pain.  5. Diabetes type 2.  6. Gout.  7. Hyperlipidemia.  8. Hypothyroidism.  9. Arthritis.  10.Peripheral neuropathy.  11.Benign prostatic hypertrophy.   SOCIAL HISTORY:  He is a nondrinker, non-drug user.  He is a smoker.  He  also goes to the [place] for most of his care.   PRIMARY CARE PHYSICIAN:  Other at the [place].   ALLERGIES:  No known drug allergies.   PAST SURGICAL HISTORY:  None.   FAMILY HISTORY:  History of kidney cancer in his brother.   MEDICATIONS:  1. Lisinopril 5 mg once daily.  2. Simvastatin, there is no dose given.  3. Aspirin 81 mg daily.  4. Gabapentin 100 mg t.i.d.  5. Synthroid 0.1 mg p.o. daily.  6. Allopurinol 100 mg p.o. daily.  7. Glipizide 10 mg p.o. daily.  8. Colace 100 mg two times a day.  9. Cimetidine 400 mg at bedtime.  10.Indocin 50 mg two times a day.   REVIEW OF SYSTEMS:  CONSTITUTIONALLY:  Negative for weakness.  Positive  for appetite changes.  HEENT:  Negative for eye pain or changes in  vision.  Ear, nose and throat negative for ear pain, hearing loss,  hoarseness, sinus pressure, sore throat.  CARDIOVASCULAR:  Negative for  chest pain, palpitations or bradycardia.  RESPIRATORY:  Negative for  cough, dyspnea, wheezing, tachycardia.  GASTROINTESTINAL:  Positive for  nausea, vomiting, dyspepsia and epigastric pain and abdominal cramping.  Negative for diarrhea, constipation, dysphagia, hematemesis.  GU:  Negative for dysuria, dribbling or frequency.  MUSCULOSKELETAL:  Negative for arthralgias, arthritis.  SKIN: Negative for pruritus or  rash.  NEUROLOGICAL:  Negative for headache, confusion or changes in  mental status.  METABOLIC:  Negative for excessive thirst and cold.  HEMATOLOGIC:  Negative for anemia.   PHYSICAL EXAMINATION:  VITAL SIGNS:  Blood pressure 110/48, pulse 80,  respirations 14, temperature 97.8, pulse oximetry 100%.  CONSTITUTIONALLY:  This is a 65 year old Caucasian male who is well-  developed, well-nourished and  in no acute distress.  HEENT:  Normocephalic, atraumatic.  Eyes:  PERRLA.  EOMI.  Currently,  during interview, the patient is very sleepy.  Ears:  TMs visualized  bilaterally.  Throat is clear.  Teeth are in good repair.  NECK:  Supple.  No lymphadenopathy noted.  CARDIOVASCULAR:  Regular rate and rhythm.  No murmurs, rubs or gallops.  RESPIRATORY:  Breath sounds are equal bilaterally.  No rhonchi, rales or  wheezes.  ABDOMEN:  Soft, nontender, nondistended.  No masses noted.  No guarding.  This is possibly due to pain medication administration.  EXTREMITIES:  There is a rash on the lower extremities, but there is no  edema, cyanosis or ecchymosis.  NEUROLOGICAL:  Cranial nerves II-XII are grossly intact.   STUDIES:  EKG showed heart rate of 72, BPMs normal sinus rhythm with  prolonged T, nonspecific ST-T wave changes.  This compared to his other  EKG back in July which showed that he was found in atrial fibrillation.   LABORATORY DATA:   His PTT was 29, INR 2.1, white count 11.4, hemoglobin  10.6, hematocrit 31.9, platelets 188.  Amylase 2570, lipase over 2000.  Cardiac markers:  CK MB was 3.3, troponins less than 0.05, myoglobin  198.  It is noted he has large blood in his urine.   ASSESSMENT/PLAN:  1. Pancreatitis.  Will go ahead and make the patient n.p.o.  Will      start aggressive IV fluid hydration.  Will also do pain management.      Will get a GI consult for the patient and continue to monitor their      amylase and lipase levels.  2. Hematuria.  The patient currently is on Coumadin.  Will go ahead      and hold Coumadin and get a Urology consult because this is also      affecting patient's hemoglobin and hematocrit possibly, and will      then see what recommendations they may have.  The patient has a      history of BPH.  Will see if there is a concern regarding that.  3. Anemia.  This may be secondary to #2.  Will go ahead and order      serial H&H's on him as well.  4. Atrial fibrillation.  Currently, the patient is in normal sinus      rhythm, and will continue to monitor that.      However, with the hematuria, this complicates the picture.  Will      have an EKG done in the morning and then determine the best course      of action, but definitely, as long as his atrial fibrillation is      stable, will have Cardiology come take a look at them and make a      decision as to what needs to be done.      Dorris Singh, DO  Electronically Signed     CB/MEDQ  D:  05/07/2007  T:  05/07/2007  Job:  [id number]

## 2010-07-03 NOTE — Discharge Summary (Signed)
Tony [name], Tony [name]                ACCOUNT NO.:  [id number]   MEDICAL RECORD NO.:  [id number]          PATIENT TYPE:  INP   LOCATION:  A212                          FACILITY:  [facility]   PHYSICIAN:  Dorris Singh, DO    DATE OF BIRTH:  January 12, 1946   DATE OF ADMISSION:  05/06/2007  DATE OF DISCHARGE:  LH                               DISCHARGE SUMMARY   INTERIM DISCHARGE SUMMARY:  Await transfer to [place].  The possible date of  transfer may be May 10, 2007.   ADMISSION DIAGNOSES:  1. Acute pancreatitis.  2. Hematuria.  3. Anemia.  4. Atrial fibrillation.   DISCHARGE DIAGNOSES:  1. Acute cholecystitis.  2. Pancreatitis.  3. Pancytopenia.  4. Atrial fibrillation.  5. Diabetes type 2.  6. Gout.  7. Hyperthyroidism.   PRIMARY CARE PHYSICIAN:  [facility] [place] in [place].   His H&P was done by Dr. Dorris Singh.  To summarize, the patient is a  65 year old male who presented to [facility] emergency room with a  reported off-and-on history of abdominal pain that resolved on March 19;  however, the pain did not resolve and the patient admitted to several  episodes of vomiting and epigastric pain that was not relieved by any of  his current medications.  He was then seen in the emergency room and  found to have a lipase over 2000 and amylase of 2570.  At that point in  time, he was admitted to the hospital and to the service of [facility]  for acute pancreatitis.  Also it was noted that the patient was in renal  insufficiency and he was cautiously hydrated as well.  His liver enzymes  were also elevated, the highest being AST 212 and ALT of 182.  His uric  acid was checked with his history of gout, which was 9.4.  Due to the  patient's hematuria and anemia, his initial hemoglobin was 10.6 and  hematocrit was 31.9, all the patient's anti-inflammatories and  anticoagulants were held.   Since the 19th, we have been dealing with several situations:  1. Pancreatitis.  The patient has been  followed by GI, who determined      based on a CT that was done that he was found to have multiple      gallstones.  The CT was done on the 19th.  There was some      thickening in the gallbladder wall, pericholecystic fluid with      debris __________ in the neck of the gallbladder consistent with      uncalcified gallstones or sludge.  Acute cholecystitis was a      definite consideration.  There was some stranding and a small      amount of fluid near the neck of the pancreas consistent with mild      pancreatitis.  No pancreatic ductal dilation seen.  At that point      in time, due to the diagnosis of gallstones, surgery was then      consulted to see the patient.  Also on the 19th, Dr. Jerre Simon saw  the      patient for hematuria, who recommend that he have a urologic workup      and recommended that he be seen in the office once he is discharged      from the hospital.  On the 20th, the patient was seen by surgery,      who recommended that he have a lap chole once his pancreas was      resolved.  Also we had pharmacy managing his Coumadin, which we are      holding due to the hematuria.  It was also recommended that      cardiology see the patient, and an echo was ordered due to atrial      fibrillation and due to the possibility of patient having to have      surgery as well.  It showed normal function, and their note was      appreciated.  On the 20th, GI, Dr. Cira Servant, actually saw the patient      and recommended to advance his diet, and we are awaiting surgery.      On the 21st, it was mentioned that the patient could have the      surgery done at the [place].  Since that point in time, staff has been      working faxing the [place] the information needed for possible transfer.      I have discussed this with the patient.  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  I discussed with him today the      possibility of transfer and to get him ready to transfer to the [place].      He states that he is willing to do whatever we need.  I explained      to him that he has  different situations that we are dealing with,      one being the cholecystitis, which he needs surgery for, two being      the pancreatitis, which is improving slowly, three being the atrial      fibrillation, which we have him not on Coumadin but he is on his      blood pressure medication, so we will continue to monitor that as      well, and four being the hematuria and the anemia that do not seem      to be resolved at this point in time, and we will need to transfuse      him today due to his hemoglobin continuing to drop.  Also the      patient's platelets are low as well.  We have seen a trend in this,      trending down from admission.  Due to the anticipation of transfer      and surgery possibly on Monday and Tuesday, we will go ahead and      start the process of getting his platelets transfused as well.      Currently the patient is stable for transport.  We will continue to  monitor him and make any changes as necessary.  Also one other      situation that was found is that his TSH was checked and it was      found to be low.  We will go ahead and decrease his thyroid      hormone.  He was found to be hyperthyroid with a TSH of 0.067, so      we will decrease his thyroid hormone to 75 mcg as well, and we will      check his current lipase and amylase status as well as his uric      acid.  The patient's uric acid level is currently high, but due to      the hematuria and the anemia we held off on his indomethacin.   His medications currently for today that he will be sent on in transfer  to the [place] will  be:  1. Xopenex nebulizers 0.63 mg every 4 hours.  2. Levothyroxine 75 mcg p.o. daily.  3. Allopurinol 100 mg p.o. daily.  4. Neurontin 100 mg p.o. t.i.d.  5. Insulin sliding scale, which is a sensitive sliding scale glycemic      protocol that we have here.  6. Lopressor 25 mg p.o. b.i.d.  7. Protonix 40 mg.  We will change that to p.o. q.12h.  8. Unasyn 3 grams IV q.6h.   We will saline lock him.  He has hydromorphone 1 to 2 mg IV q.4h.  p.r.n., Phenergan 12.5 mg IV q.4h. p.r.n., Zofran 4 mg IV q.6h. p.r.n.,  Ativan 1 mg IV q.4h. p.r.n. and Dulcolax suppository, 10 mg, can repeat  p.r.n., Tylenol 650 mg p.o. q.6-8h. p.r.n., and ibuprofen 800 mg q.6h.  p.r.n.   CONDITION:  Stable.   [facility]   This is an interim discharge summary, so if anything needs to be  changed, the doctor who is involved in the transfer will note those  changes.      Dorris Singh, DO  Electronically Signed     CB/MEDQ  D:  05/09/2007  T:  05/09/2007  Job:  [id number]

## 2010-07-03 NOTE — H&P (Signed)
Tony [name], Tony [name]                ACCOUNT NO.:  [id number]   MEDICAL RECORD NO.:  [id number]          PATIENT TYPE:  INP   LOCATION:  2113                         FACILITY:  [facility]   PHYSICIAN:  Hind I Elsaid, MD      DATE OF BIRTH:  09-22-1945   DATE OF ADMISSION:  09/04/2006  DATE OF DISCHARGE:                              HISTORY & PHYSICAL   CHIEF COMPLAINT:  Sent from [facility] for evaluation of [facility]  spotted fever/sepsis.   HISTORY OF PRESENT ILLNESS:  A 65 year old male with a history of  diabetes mellitus, history of peripheral neuropathy, gout, presented  yesterday to [facility] with history of a tick bite last week,  exactly on Wednesday, status post removal from his left thigh.  Patient  denies any bleeding after the removal of the tick bite.  After that,  patient continued to complain of lethargy, shivering and fever.  Also,  patient continued to complain of myalgia and arthralgia, headache.  Denies any body rash, denies any change or abnormality in behavior or  altered mental status.  Patient seen today at [facility] by the  ER physician, Dr. Neale Burly, where he asked [place] to admit the patient, as  they do not have an infectious disease physician to decide regarding  possibility for [facility] spotted fever.  Patient was hypotensive  at [facility].  He is status post IV fluid where blood pressure returned  to 96/63, baseline blood pressure was 79/49.  At this time, patient  denies any shortness of breath, denies any chest pain, positive for  nausea, but not vomiting, no abdominal pain.  Last bowel movement was  yesterday at [facility] with good amount.  Patient denies any  abnormal weakness or numbness of his extremity.  He has chronic  peripheral  neuropathy.  Patient cannot recall any evidence of any rash  on his body.   PAST MEDICAL HISTORY:  1. Lower spinal stenosis.  2. Peripheral neuropathy.  3. Diabetes mellitus.  4.  Gout.  5. Arthritis.  6. Hypothyroidism.  7. Benign prostatic hypertrophy.   MEDICATIONS:  1. Aspirin 81 mg p.o. daily.  2. Cimetidine 400 mg at bedtime.  3. Neurontin 100 mg p.o. three times.  4. Glipizide 10 mg p.o. daily.  5. Docusate 100 mg p.o. b.i.d.  6. Vitamin B12 shot every two weeks.  7. Calcium 600 mg with vitamin D.  8. Levalbuterol  59 mcg two puffs every six hours p.r.n.  9. Niacin 500 mg two tabs q.h.s.  10.Lisinopril 5 mg p.o. daily.  11.Simvastatin, dose unknown.  12.Synthroid 0.1 mg p.o. q.a.m.  13.Metformin 1000 mg p.o. before meals.  14.Allopurinol 100 mg p.o. daily.  15.Tylenol-PM two tabs q.h.s.  16.Flomax 0.4 mg p.o. daily.   ALLERGIES:  No known drug allergies.   PAST SURGICAL HISTORY:  No past surgical history.   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   FAMILY HISTORY:  History of kidney cancer in his brother.  PHYSICAL EXAMINATION:  VITAL SIGNS IN THE ICU:  Temperature 98.3, T-max  101.1 at [facility], blood pressure 108/37 with mean blood pressure 59,  CBG  133, saturating 99% on room air.  GENERAL EXAMINATION:  Patient lying flat on bed, no respiratory distress  or shortness of breath.  HEENT:  Normocephalic, atraumatic.  Extraocular movement within normal.  Pupils equal and reactive to light and accommodations.  Mucosa is moist.  NECK EXAMINATION:  No lymphadenopathy, no thyromegaly and no JVP.  HEART EXAMINATION:  S1 and S2.  No added sounds.  LUNG EXAMINATION:  Normal vesicular breathing with equal air entry.  ABDOMEN:  Distended.  Bowel sounds positive.  Nontender.  Inguinal area:  There is no inguinal lymphadenopathy.  SKIN:  On the site of the skin of the tick bite was clear.  There is  some erythema around his neck.  There are no other rashes identified.  Also, he has rashes on lower extremities which just seem like chronic  venous stasis ulcer.  LOWER  EXTREMITIES:  Right leg:  There is a skin rash which is old, which  most probably to chronic renal stasis ulcer.  Peripheral  pulses  diminished bilateral.  No edema.  BACK:  No area of point tenderness.  CNS:  Alert, oriented x3 with no focal neurological finding.  He has  chronic neuropathy of his feet.   Blood workup done at [facility]:  Urinalysis was positive for  trace hemoglobin and trace ketones.   Chest x-ray:  No acute cardiopulmonary disease.   Sodium 133, potassium 3.8, chloride 100, carbon dioxide 26, glucose 143,  BUN 34, anion gap of 7, creatinine 2.68, and calcium 8.7.   INR of 1.1 which is normal, PT 13.9.  Urine:  RBCs 0-2, bacteria few.  White blood cells 3.8, hemoglobin 12.8, hematocrit 37 and platelet 101.  D-dimer of 3.82.   Patient right now on IV fluid, normal saline at 200 with blood pressure  around 108/37.   PROBLEM LIST:  1. Hypotension, rule out sepsis/thrombocytopenic purpura ,[facility] spotted fever versus enchilosis.  Will admit the patient      to the ICU.  Will continue with aggressive IV fluid hydration.      Will repeat the CBC, C-Met, PT/INR and PTT.  Will get peripheral      smear for schistocyte and LDH to rule out TTP.  Will get ELISA test      for [facility] spotted fever and ENCHYLOSISs.  Will start the      patient on doxycycline IV 100 mg twice a day and Rocephin      intravenous every 12 hours.  Will get lactate level.  Will consider      intensive care consult and infectious disease consult if condition      is not improving.  2. High D-dimer, could be secondary to early DIC versus thrombosis,      multiple pulmonary emboli, as the patient hypotensive.  Will get      venous Doppler of the bilateral lower extremities and VQ scan.      Will hold off on anticoagulation at this time.  3. Renal insufficiency.  Patient's baseline is unknown.  Will hold ACE      inhibitor.  Will hold allopurinol.  Will continue with  the IV      fluid.  Will get input and output, ultrasound of the renal.  4. Rule out [facility] spotted  fever.  I will call infectious      disease for consult in a.m.  5. Diabetes mellitus.  Will hold metformin and glipizide.  Will start      insulin, Lantus 1 unit with NovoLog sliding scale.  6. Neuropathy.  Continue with Neurontin.  7. BPH.  Flomax.  8. Mild neutropenia.  White blood cells 3.8.  Will get CBC with      differential.  We would like to rule out intravascular hemolysis.      Will get haptoglobin level and urine for hemosiderin.  Further      recommendation depending on the above results and about the      patient's hospital stay.  9. DVT and GI prophylaxis.      Hind Bosie Helper, MD  Electronically Signed     HIE/MEDQ  D:  09/04/2006  T:  09/04/2006  Job:  [id number]

## 2010-07-03 NOTE — Discharge Summary (Signed)
Tony [name], Tony [name]                ACCOUNT NO.:  [id number]   MEDICAL RECORD NO.:  [id number]          PATIENT TYPE:  INP   LOCATION:  A212                          FACILITY:  [facility]   PHYSICIAN:  Skeet Latch, DO    DATE OF BIRTH:  17-Sep-1945   DATE OF ADMISSION:  05/06/2007  DATE OF DISCHARGE:  03/23/2009LH                               DISCHARGE SUMMARY   ADDENDUM   ADMITTING DIAGNOSES:  1. Acute pancreatitis.  2. Hematuria.  3. Anemia.  4. Atrial fibrillation.   DISCHARGE DIAGNOSES:  1. Acute cholecystitis.  2. Resolving pancreatitis.  3. Pancytopenia.  4. Atrial fibrillation.  5. Type 2 diabetes.  6. Gout.  7. Hyperthyroidism.   HISTORY AND PHYSICAL:  Please refer to Dr. Claudean Severance interim H&P for  details.  The patient at this time is stable for transfer via [facility]  to [facility].  Three days prior, I received a fax from the [place]  regarding any procedures that need to be done.  Patient will be  transferred to the [facility] for any surgeries.  At that time, we faxed  documentation regarding the patient's condition and need for a  laparoscopic cholecystectomy.  On that day which was May 07, 2007, as  stated the  fax was sent. Then, on May 08, 2007, and May 09, 2007,  other faxes were sent regarding the patient's condition and need for a  transfer.  The hospitalist spoke with the person supposedly in charge of  transferring at that time.  It was made clear that they continually did  not receive our faxes regarding Tony [name].  Then on May 11, 2007, I  spoke with the medical service as well as surgical service about his  condition and need for transfer.  After multiple phone conversations, it  was decided the patient would be accepted by surgical service at the [place].  On my initial conversation, there were no beds available for transfer,  and the patient will be scheduled in approximately a week for his  cholecystectomy.  Subsequently, the nursing staff was told that  there is  a bed available, and the patient will be transferred via [facility] care  to the [facility] for a pending laparoscopic cholecystectomy.  At this time, I  feel the patient is stable to be discharged.  The patient has received  approximately 3 units of packed red blood cells and 1 unit of platelets  over the last few days.  At this time, the patientis stable and can be  discharged.  His vitals on discharge,  temperature is 98.0, pulse 62,  respirations 22, blood pressure 143/87, sating 98% on room air.   Last labs:  PT is 14.4, INR is 1.1, sodium 140, potassium 4.0, chloride  106, CO2 is 27, glucose 161, BUN 13, creatinine 1.06.  White count 5.7,  hemoglobin 10.6, hematocrit 31.0, platelets 152.  Please see Dr.  Claudean Severance discharge summary on May 09, 2007, for discharge  medications.   Discharge condition is stable.   [facility]  The patient is awaiting discharge at  this time.  He is to be discharged via [facility] to the [place].   DISCHARGE INSTRUCTIONS:  [facility].      Skeet Latch, DO  Electronically Signed     SM/MEDQ  D:  05/11/2007  T:  05/12/2007  Job:  [id number]

## 2010-07-03 NOTE — Consult Note (Signed)
Tony [name], Tony [name]                ACCOUNT NO.:  [id number]   MEDICAL RECORD NO.:  [id number]          PATIENT TYPE:  INP   LOCATION:  A212                          FACILITY:  [facility]   PHYSICIAN:  Kassie Mends, M.D.      DATE OF BIRTH:  1945-02-23   DATE OF CONSULTATION:  DATE OF DISCHARGE:                                 CONSULTATION   REQUESTING PHYSICIAN:  [facility] Team physician   REASON FOR CONSULTATION:  Acute pancreatitis.   HISTORY OF PRESENT ILLNESS:  Tony [name] is a 65 year old male.  He says  last night around 8:00 he developed severe upper abdominal pain.  This  was after eating [place] sausage for dinner.  He has never experienced  pain like this before.  The pain radiated up through his chest.  He  describes the pain as a pressure 10/10 worse on pain scale.  He had  nausea and vomited multiple times.  He did have some chills, thinks he  may have had a low grade fever.  Prior to this onset, his weight has  been stable.  He has had some intermittent aching over the last 2 weeks  but nothing as severe as last night.  He denies any heartburn,  indigestion, dysphagia, or odynophagia on a regular basis.  He denies  any history of anorexia.  He does have history of hyperlipidemia, has  been on Zocor and niacin prior to hospitalization.  He did have diarrhea  yesterday but denies any chronic history of this, denies any rectal  bleeding or melena.  Denies any history of alcohol abuse.  Denies any  history of gallstones.  Amylase was found to be 2570, lipase greater  than 2000.  Total bilirubin 1.6, alkaline phosphatase 136, AST 182 and  ALT 89.   PAST MEDICAL AND SURGICAL HISTORY:  1. Atrial fibrillation on chronic Coumadin.  2. Hypertension.  3. Arthritis.  4. Diabetes mellitus.  5. Gout.  6. Hyperlipidemia.  7. Peripheral neuropathy.  8. Hypothyroidism.  9. BPH.  10.He sustained multiple injuries in [place] and has been left with      chronic back pain.   MEDICATIONS PRIOR TO ADMISSION:  1. Aspirin 81 mg daily.  2. Gabapentin 100 mg t.i.d.  3. Synthroid 0.1 mg daily.  4. Niacin once daily.  5. Allopurinol 100 mg daily.  6. Simvastatin unknown dose daily.  7. Lisinopril 5 mg daily.  8. Glipizide 10 mg daily.  9. Docusate 100 mg b.i.d.  10.Cimetidine 400 mg q.h.s.  11.Indocin 50 mg b.i.d.  12.Vicodin p.r.n.  13.Tylenol P.M. p.r.n.   ALLERGIES:  NO KNOWN DRUG ALLERGIES.   FAMILY HISTORY:  There is no known family history of colon carcinoma or  chronic GI problems.  Mother deceased at 32 secondary to coronary artery  disease.  Father deceased at 46 secondary to an accident.  One brother  deceased secondary to renal carcinoma.   SOCIAL HISTORY:  Denies any tobacco, alcohol, or drug use.  He is  married.  He has 9 healthy children.  He is disabled.  He previously  lived  in [place], worked as a Print production planner. [employer] and  served in [place].   REVIEW OF SYSTEMS:  See HPI.  GU:  He has hematuria and increased  urinary frequency.  He is going to be evaluated by a urologist.  Otherwise, negative review of systems.   PHYSICAL EXAM:  VITAL SIGNS:  Weight 112.9 kg, height 73 inches,  ________76, pulse 112, respirations 20, blood pressure 130/72, O2  saturation 94% on room air.  GENERAL:  Tony [name] is an obese Caucasian male who is alert, oriented,  pleasant, and cooperative, in no acute distress.  HEENT:  Clear sclerae, nonicteric, conjunctivae pink.  Oropharynx pink  and moist without lesions.  NECK:  Supple without thyromegaly.  HEART:  Irregularly_________ without any murmurs, clicks, rubs, or  gallops.  LUNGS:  Clear to auscultation bilaterally.  ABDOMEN:  Protuberant with positive bowel sounds x4.  No bruits  auscultated.  Abdomen is tender to the epigastrium and around the  umbilicus as well as left upper quadrant.  There is no rebound  tenderness or guarding.  Liver is palpable 2 fingerbreadths below the  right costal  margin and nontender.  EXTREMITIES:  He has erythema and scaling to his right lower extremity  mid way between his heel and knee anteriorly and then trace bilateral  lower extremity edema pretibially.   LABORATORY STUDIES:  Hemoglobin 10.6, hematocrit 31.9, platelets 188,  WBC 11.4, INR 2.1.  Prothrombin time 24.4, calcium 9.3, sodium 138,  potassium 3.6, chloride 105, CO2 25, BUN 16, creatinine 1.17, and  glucose 229.  Urinalysis showed small amount of bilirubin and red blood  cells, total protein 6.4, and albumin 3.9.   IMPRESSION:  Tony [name] is a 65 year old male with acute pancreatitis  given elevated amylase and lipase, likely secondary to galsstone/sludge.  Needs imaging to evaluate for gallstones, masses, cholecystitis, or  stricture.  I doubt his lipids are high enough to cause this, and there  is no history of alcohol abuse.   PLAN:  1. CT scan of abdomen with IV and oral contrast with focus on the      pancreas.  2. NPO.  3. May have ice chips. Repeat lipase and HFP.  4. Agree with pain control, & antiemetics.   We would like to thank the [facility] team for allowing to participate  in the care of Tony [name].      Lorenza Burton, N.P.      Kassie Mends, M.D.  Electronically Signed    KJ/MEDQ  D:  05/07/2007  T:  05/07/2007  Job:  [id number]

## 2010-07-03 NOTE — Consult Note (Signed)
NAMELAURO, MANLOVE                ACCOUNT NO.:  [id number]   MEDICAL RECORD NO.:  [id number]          PATIENT TYPE:  INP   LOCATION:  A212                          FACILITY:  [facility]   PHYSICIAN:  Gerrit Friends. Dietrich Pates, MD, FACCDATE OF BIRTH:  01/02/1946   DATE OF CONSULTATION:  05/08/2007  DATE OF DISCHARGE:                                 CONSULTATION   REFERRING PHYSICIAN:  Dr. Dorris Singh from [facility]  P.   PRIMARY CARE PHYSICIAN:  Dr. Jerlyn Ly at the [facility].   REASON FOR CONSULTATION:  Atrial fibrillation.   HISTORY OF PRESENT ILLNESS:  Mr. Aragones is a 65 year old male patient  with a history of paroxysmal atrial fibrillation on Coumadin therapy,  followed at the [facility] in [place] who presented to  [facility] recently with acute abdominal pain.  He has been  diagnosed with acute pancreatitis.  He has some cholestatic sludge and  likely has biliary pancreatitis.  Surgery has already seen him and plans  to do laparoscopic cholecystectomy once his pancreatitis resolves.  The  patient has also been noted to have microscopic hematuria by urinalysis.  Urology has already seen the patient.  There is been no gross report of  hematuria per the patient.  The plans are for an outpatient workup.  He  has been taken off of his Coumadin secondary to the hematuria.  His INR  today is 1.9.  He had been in normal sinus rhythm throughout this  admission until yesterday.  He went into rapid atrial fibrillation with  heart rates in 140s.  He was placed on a Diltiazem drip.  The patient  dropped his blood pressure.  He also converted to normal sinus rhythm.  He had a long post termination pauses of over 5 seconds when he  converted to sinus rhythm.  His telemetry monitor currently reveals  probable normal sinus rhythm, although P-waves are hard to discern.  His  rhythm is regular with a heart rate in the 70s.  The patient  denies any  history of chest pain, shortness of breath or dyspnea on exertion.  He  denies any palpitations.  Of note, he did have an admission to [facility] last year with presumed [facility] spotted fever.  At  that point in time, he did developed rapid atrial fibrillation.  According to the records, he was placed on short-acting Diltiazem 30 mg  q.8 h.  However, his current medication does not with this medication.  The patient does walk with a walker secondary to neuropathy.  This is  been felt to be secondary to some type of exposure when he was in  [place].  He denies any exertional shortness of breath, however.   PAST MEDICAL HISTORY:  1. Paroxysmal atrial fibrillation.      a.     Coumadin therapy, followed by Dr. Nedra Hai at the [facility].  2. Diabetes mellitus.  3. Hyperlipidemia.  4. Hypothyroidism.  5. The patient reports a questionable history of COPD.  6. Osteoarthritis.  7. Gout.  8. Peripheral neuropathy.  9. Benign prostatic hypertrophy.  10.History of presumed [facility] spotted fever, July 2008, [facility]      [facility].   MEDICATIONS PRIOR TO ADMISSION:  1. Aspirin 81 mg daily.  2. Gabapentin 100 mg three times a day.  3. Synthroid 0.1 mg daily.  4. Allopurinol 100 mg daily.  5. Simvastatin unknown dosage nightly.  6. Lisinopril 5 mg daily.  7. Glipizide 10 mg daily.  8. Docusate 100 mg b.i.d.  9. Cimetidine 400 mg nightly.  10.Indocin 50 mg b.i.d.   ALLERGIES:  No known drug allergies.   SOCIAL HISTORY:  The patient lives in [place] with his wife.  He has  nine children.  He denies any tobacco or alcohol abuse.  He is a  disabled [place] veteran.  He was injured several times in 1969.  He was  a Charity fundraiser with the [employer]   FAMILY HISTORY:  Significant for congestive heart failure and CAD in his  mother who died from heart problems at age 31.  His brother is alive,  but did have surgery in the past for renal and prostate  carcinoma.   REVIEW OF SYSTEMS:  Please see HPI.  He denies any fevers or chills  prior to admission.  Denies any dysuria or hematuria.  Denies any  melena, hematochezia or hematemesis.  Denies any significant cough.  He  has bilateral lower extremity edema that is chronic with chronic stasis  changes that he has had since he was in [place].  He notes bilateral  lower extremity numbness secondary to his neuropathy.  He denies any  syncope or near syncope.  Denies any orthopnea, PND or pedal edema.  Rest of the review of systems is negative.   PHYSICAL EXAMINATION:  GENERAL:  He is well nourished, well developed.  VITAL SIGNS:  Blood pressure 112/59, pulse 78, temperature 99.9,  temperature max 101.5, oxygen saturation 95% on room air.  Weight 112.9  kg.  Intake and output incomplete  HEENT:  Normal.  NECK:  Without JVD.  LYMPHATICS:  Without lymphadenopathy.  ENDOCRINE:  Without thyromegaly.  CARDIAC:  Normal S1, S2, regular rate and rhythm with a 1/6 systolic  ejection murmur heard best along the left sternal border.  LUNGS:  Clear to auscultation bilaterally.  No wheezing, no rhonchi, no  rales.  ABDOMEN:  Soft with decreased bowel sounds.  EXTREMITIES:  With trace to 1+ edema bilaterally with chronic stasis  changes and skin breakdown over the anterior portion of the left lower  extremity.  MUSCULOSKELETAL:  Without joint deformity.  NEUROLOGIC:  He is alert and oriented x3.  Cranial nerves II-XII grossly  intact.  VASCULAR EXAM:  No carotid artery bruits noted bilaterally.   DIAGNOSTIC DATA:  Chest x-ray on admission revealed low lung volumes,  bibasilar atelectasis.  EKG from yesterday revealed atrial fibrillation  with heart rate of 144, normal axis, minimal ST depression in V4-V6 of a  millimeter or less.   LABORATORY DATA:  Hemoglobin 8.8, hematocrit 26, white count 6100,  platelet count 133,000.  Sodium 137, potassium 4.2, BUN 25, creatinine  1.83, glucose 150.  AST  73, ALT 110, lipase 696.  Point of care markers  negative x2.  PSA 0.83.   IMPRESSION:  1. Paroxysmal atrial fibrillation with rapid ventricular rate.      a.     Currently normal sinus rhythm.      b.     [facility]  c.     Coumadin therapy currently on hold due to recent episode of       hematuria.  2. Biliary pancreatitis.      a.     Laparoscopic cholecystectomy pending.  3. Normocytic anemia.  4. Acute renal failure.  5. Diabetes mellitus.  6. Hyperlipidemia.  7. Benign prostatic hypertrophy.  8. Microscopic hematuria.      a.     Outpatient workup planned.  9. Hypothyroidism.  10.Peripheral neuropathy.   PLAN:  1. We will check an EKG to confirm that he is still in normal sinus      rhythm.  2. We will check an echocardiogram to assess his LV structure and      function.  3. Check a TSH.  4. We will try to add low-dose metoprolol at 12.5 mg b.i.d.  This will      be held if his systolic blood pressure is less than 100 or his      heart rate is less than 60.  Hopefully he will be able to tolerate      this in an attempt to prevent recurrence of his atrial ablation.  5. We will try to attain records from the [place].  The patient denies any      history of hypertension.  He denies any history of heart failure or      stroke.  It would seem that his CHADS II score would only be 1.  We      will try to confirm the use of Coumadin through the records      obtained from the [facility].  Due to planned      surgery, it should be acceptable with continue to hold his Coumadin      for now.  6. Further recommendations to follow.   Thank you very much for this consultation.  We will be happy to follow  the patient throughout the remainder this admission.      Tereso Newcomer, PA-C      Gerrit Friends. Dietrich Pates, MD, [facility]  Electronically Signed    SW/MEDQ  D:  05/08/2007  T:  05/08/2007  Job:  [id number]   cc:   Dorris Singh  Fax:  [phone]   Christiane Ha Dr. Nedra Hai

## 2010-11-12 LAB — DIFFERENTIAL
Basophils Absolute: 0
Basophils Absolute: 0
Basophils Absolute: 0
Basophils Relative: 0
Basophils Relative: 0
Basophils Relative: 0
Eosinophils Absolute: 0
Eosinophils Absolute: 0
Eosinophils Absolute: 0
Eosinophils Relative: 0
Eosinophils Relative: 0
Eosinophils Relative: 0
Lymphocytes Relative: 15
Lymphocytes Relative: 3 — ABNORMAL LOW
Lymphocytes Relative: 6 — ABNORMAL LOW
Lymphs Abs: 0.4 — ABNORMAL LOW
Lymphs Abs: 0.7
Lymphs Abs: 0.8
Monocytes Absolute: 0.5
Monocytes Absolute: 0.6
Monocytes Relative: 10
Monocytes Relative: 12
Monocytes Relative: 5
Neutro Abs: 5.4
Neutrophils Relative %: 73
Neutrophils Relative %: 79 — ABNORMAL HIGH
Neutrophils Relative %: 88 — ABNORMAL HIGH
Neutrophils Relative %: 92 — ABNORMAL HIGH
Neutrophils Relative %: 92 — ABNORMAL HIGH

## 2010-11-12 LAB — URINE CULTURE
Colony Count: NO GROWTH
Culture: NO GROWTH

## 2010-11-12 LAB — HEPATIC FUNCTION PANEL
AST: 73 — ABNORMAL HIGH
Albumin: 3 — ABNORMAL LOW
Bilirubin, Direct: 0.3
Indirect Bilirubin: 0.9
Total Protein: 5.6 — ABNORMAL LOW

## 2010-11-12 LAB — CROSSMATCH

## 2010-11-12 LAB — BASIC METABOLIC PANEL
BUN: 15
CO2: 28
Calcium: 8.5
Calcium: 8.5
Creatinine, Ser: 1.04
Creatinine, Ser: 1.83 — ABNORMAL HIGH
GFR calc non Af Amer: >60
GFR calc non Af Amer: >60
Glucose, Bld: 150 — ABNORMAL HIGH
Glucose, Bld: 152 — ABNORMAL HIGH
Potassium: 4
Sodium: 137
Sodium: 140

## 2010-11-12 LAB — URINALYSIS, ROUTINE W REFLEX MICROSCOPIC
Bilirubin Urine: NEGATIVE
Glucose, UA: NEGATIVE
Glucose, UA: NEGATIVE
Ketones, ur: NEGATIVE
Ketones, ur: NEGATIVE
Leukocytes, UA: NEGATIVE
Nitrite: NEGATIVE
Protein, ur: 100 — AB
Protein, ur: 30 — AB
Specific Gravity, Urine: 1.015
Urobilinogen, UA: 0.2
pH: 6
pH: 6.5

## 2010-11-12 LAB — CBC
HCT: 31 — ABNORMAL LOW
HCT: 31.9 — ABNORMAL LOW
Hemoglobin: 10.6 — ABNORMAL LOW
Hemoglobin: 10.6 — ABNORMAL LOW
MCHC: 32.9
MCHC: 33.2
MCHC: 33.6
MCHC: 33.9
MCHC: 33.9
MCV: 84.6
MCV: 85.5
MCV: 85.6
MCV: 86.3
Platelets: 113 — ABNORMAL LOW
Platelets: 133 — ABNORMAL LOW
Platelets: 162
RBC: 3.33 — ABNORMAL LOW
RBC: 3.62 — ABNORMAL LOW
RBC: 3.73 — ABNORMAL LOW
RDW: 14.4
RDW: 14.4
RDW: 14.9
RDW: 15
WBC: 11.4 — ABNORMAL HIGH
WBC: 5.7

## 2010-11-12 LAB — COMPREHENSIVE METABOLIC PANEL
AST: 212 — ABNORMAL HIGH
BUN: 16
CO2: 25
CO2: 28
Calcium: 9.1
Calcium: 9.3
Chloride: 105
Creatinine, Ser: 1.17
Creatinine, Ser: 1.53 — ABNORMAL HIGH
GFR calc Af Amer: 56 — ABNORMAL LOW
GFR calc non Af Amer: 47 — ABNORMAL LOW
GFR calc non Af Amer: >60
Glucose, Bld: 229 — ABNORMAL HIGH
Sodium: 138
Total Bilirubin: 1.6 — ABNORMAL HIGH
Total Protein: 5.9 — ABNORMAL LOW

## 2010-11-12 LAB — PREPARE PLATELET PHERESIS

## 2010-11-12 LAB — HEMOGLOBIN AND HEMATOCRIT, BLOOD
HCT: 27.4 — ABNORMAL LOW
HCT: 31.9 — ABNORMAL LOW
Hemoglobin: 10.3 — ABNORMAL LOW
Hemoglobin: 10.6 — ABNORMAL LOW
Hemoglobin: 8.4 — ABNORMAL LOW
Hemoglobin: 9.4 — ABNORMAL LOW

## 2010-11-12 LAB — PREPARE RBC (CROSSMATCH)

## 2010-11-12 LAB — APTT
aPTT: 29
aPTT: 35

## 2010-11-12 LAB — POCT CARDIAC MARKERS
Myoglobin, poc: 129
Myoglobin, poc: 198
Operator id: 103621
Operator id: 215201
Troponin i, poc: <0.05

## 2010-11-12 LAB — RETICULOCYTES
Retic Count, Absolute: 45.4
Retic Ct Pct: 1.6

## 2010-11-12 LAB — LIPASE, BLOOD
Lipase: 1278 — ABNORMAL HIGH
Lipase: 201 — ABNORMAL HIGH
Lipase: 696 — ABNORMAL HIGH
Lipase: >2000 — ABNORMAL HIGH

## 2010-11-12 LAB — AMYLASE
Amylase: 1577 — ABNORMAL HIGH
Amylase: 2570 — ABNORMAL HIGH

## 2010-11-12 LAB — TECHNOLOGIST SMEAR REVIEW: Path Review: NONE SEEN

## 2010-11-12 LAB — ABO/RH: ABO/RH(D): A POS

## 2010-11-12 LAB — CULTURE, BLOOD (ROUTINE X 2): Report Status: 3272009

## 2010-11-12 LAB — PSA: PSA: 0.83

## 2010-11-12 LAB — PROTIME-INR
INR: 1.1
INR: 1.9 — ABNORMAL HIGH
INR: 2.1 — ABNORMAL HIGH
Prothrombin Time: 14.4
Prothrombin Time: 24.4 — ABNORMAL HIGH
Prothrombin Time: 25.6 — ABNORMAL HIGH

## 2010-11-12 LAB — TSH: TSH: 0.067 — ABNORMAL LOW

## 2010-11-12 LAB — URIC ACID: Uric Acid, Serum: 9.4 — ABNORMAL HIGH

## 2010-12-03 LAB — DIFFERENTIAL
Basophils Absolute: 0
Basophils Absolute: 0
Basophils Relative: 0
Basophils Relative: 1
Eosinophils Absolute: 0
Eosinophils Absolute: 0.1
Eosinophils Relative: 0
Eosinophils Relative: 0
Eosinophils Relative: 3
Lymphocytes Relative: 24
Lymphs Abs: 0.8
Lymphs Abs: 0.9
Monocytes Absolute: 0.3
Monocytes Relative: 9
Neutro Abs: 2.1
Neutrophils Relative %: 62

## 2010-12-03 LAB — BASIC METABOLIC PANEL
BUN: 34 — ABNORMAL HIGH
CO2: 26
CO2: 25
Chloride: 100
Chloride: 111
GFR calc Af Amer: >60
GFR calc non Af Amer: 24 — ABNORMAL LOW
Glucose, Bld: 143 — ABNORMAL HIGH
Potassium: 3.8
Potassium: 3.8
Sodium: 142

## 2010-12-03 LAB — URINE CULTURE
Colony Count: NO GROWTH
Culture: NO GROWTH

## 2010-12-03 LAB — COMPREHENSIVE METABOLIC PANEL
ALT: 22
AST: 22
AST: 29
Albumin: 3 — ABNORMAL LOW
Alkaline Phosphatase: 34 — ABNORMAL LOW
BUN: 13
CO2: 23
CO2: 26
Calcium: 8.9
Chloride: 112
Creatinine, Ser: 0.92
GFR calc Af Amer: >60
GFR calc Af Amer: >60
GFR calc non Af Amer: >60
Glucose, Bld: 120 — ABNORMAL HIGH
Potassium: 3.8
Sodium: 138
Total Bilirubin: 0.7
Total Protein: 5.5 — ABNORMAL LOW

## 2010-12-03 LAB — CULTURE, BLOOD (ROUTINE X 2)
Culture: NO GROWTH
Report Status: 7212008
Report Status: 7212008

## 2010-12-03 LAB — URINE MICROSCOPIC-ADD ON

## 2010-12-03 LAB — CBC
HCT: 37 — ABNORMAL LOW
HCT: 33 — ABNORMAL LOW
HCT: 34.5 — ABNORMAL LOW
HCT: 35.6 — ABNORMAL LOW
Hemoglobin: 11.3 — ABNORMAL LOW
Hemoglobin: 12.3 — ABNORMAL LOW
MCHC: 34.5
MCHC: 34.1
MCHC: 34.4
MCV: 91.8
MCV: 92.1
MCV: 92.3
Platelets: 101 — ABNORMAL LOW
Platelets: 109 — ABNORMAL LOW
Platelets: 86 — ABNORMAL LOW
RBC: 3.87 — ABNORMAL LOW
RDW: 14
WBC: 3 — ABNORMAL LOW
WBC: 3.5 — ABNORMAL LOW

## 2010-12-03 LAB — URINALYSIS, ROUTINE W REFLEX MICROSCOPIC
Glucose, UA: NEGATIVE
Leukocytes, UA: NEGATIVE
Leukocytes, UA: NEGATIVE
Nitrite: NEGATIVE
Protein, ur: 30 — AB
Specific Gravity, Urine: 1.009
Urobilinogen, UA: 1
pH: 6.5

## 2010-12-03 LAB — APTT: aPTT: 32

## 2010-12-03 LAB — PROTIME-INR
INR: 1.1
INR: 1.1

## 2010-12-03 LAB — D-DIMER, QUANTITATIVE: D-Dimer, Quant: 3.82 — ABNORMAL HIGH

## 2010-12-03 LAB — HEPARIN ANTIBODY SCREEN: Heparin Antibody Screen: NEGATIVE

## 2010-12-03 LAB — CORTISOL: Cortisol, Plasma: 14.4

## 2010-12-03 LAB — HAPTOGLOBIN: Haptoglobin: 140

## 2010-12-03 LAB — TSH: TSH: 3.589

## 2011-01-13 ENCOUNTER — Other Ambulatory Visit: Payer: Self-pay

## 2011-01-13 ENCOUNTER — Emergency Department (HOSPITAL_COMMUNITY): Payer: Non-veteran care

## 2011-01-13 ENCOUNTER — Inpatient Hospital Stay (HOSPITAL_COMMUNITY): Payer: Non-veteran care

## 2011-01-13 ENCOUNTER — Inpatient Hospital Stay (HOSPITAL_COMMUNITY)
Admission: EM | Admit: 2011-01-13 | Discharge: 2011-01-15 | DRG: 603 | Disposition: A | Discharge status: Home / Self Care | Payer: Non-veteran care | Attending: Internal Medicine | Admitting: Internal Medicine

## 2011-01-13 DIAGNOSIS — L03039 Cellulitis of unspecified toe: Principal | ICD-10-CM | POA: Diagnosis present

## 2011-01-13 DIAGNOSIS — I4891 Unspecified atrial fibrillation: Secondary | ICD-10-CM | POA: Diagnosis present

## 2011-01-13 DIAGNOSIS — E119 Type 2 diabetes mellitus without complications: Secondary | ICD-10-CM | POA: Diagnosis present

## 2011-01-13 DIAGNOSIS — L02619 Cutaneous abscess of unspecified foot: Principal | ICD-10-CM | POA: Diagnosis present

## 2011-01-13 DIAGNOSIS — J440 Chronic obstructive pulmonary disease with acute lower respiratory infection: Secondary | ICD-10-CM | POA: Diagnosis present

## 2011-01-13 DIAGNOSIS — J449 Chronic obstructive pulmonary disease, unspecified: Secondary | ICD-10-CM | POA: Diagnosis present

## 2011-01-13 DIAGNOSIS — I482 Chronic atrial fibrillation, unspecified: Secondary | ICD-10-CM | POA: Diagnosis present

## 2011-01-13 DIAGNOSIS — T45515A Adverse effect of anticoagulants, initial encounter: Secondary | ICD-10-CM

## 2011-01-13 DIAGNOSIS — E1149 Type 2 diabetes mellitus with other diabetic neurological complication: Secondary | ICD-10-CM | POA: Diagnosis present

## 2011-01-13 DIAGNOSIS — N400 Enlarged prostate without lower urinary tract symptoms: Secondary | ICD-10-CM

## 2011-01-13 DIAGNOSIS — L03119 Cellulitis of unspecified part of limb: Secondary | ICD-10-CM | POA: Diagnosis present

## 2011-01-13 DIAGNOSIS — L03115 Cellulitis of right lower limb: Secondary | ICD-10-CM | POA: Diagnosis present

## 2011-01-13 DIAGNOSIS — E11621 Type 2 diabetes mellitus with foot ulcer: Secondary | ICD-10-CM

## 2011-01-13 DIAGNOSIS — L03031 Cellulitis of right toe: Secondary | ICD-10-CM | POA: Diagnosis present

## 2011-01-13 DIAGNOSIS — M109 Gout, unspecified: Secondary | ICD-10-CM | POA: Diagnosis present

## 2011-01-13 DIAGNOSIS — E1140 Type 2 diabetes mellitus with diabetic neuropathy, unspecified: Secondary | ICD-10-CM

## 2011-01-13 DIAGNOSIS — Z7901 Long term (current) use of anticoagulants: Secondary | ICD-10-CM

## 2011-01-13 DIAGNOSIS — E1142 Type 2 diabetes mellitus with diabetic polyneuropathy: Secondary | ICD-10-CM | POA: Diagnosis present

## 2011-01-13 DIAGNOSIS — E039 Hypothyroidism, unspecified: Secondary | ICD-10-CM | POA: Diagnosis present

## 2011-01-13 DIAGNOSIS — J209 Acute bronchitis, unspecified: Secondary | ICD-10-CM | POA: Diagnosis present

## 2011-01-13 HISTORY — DX: Chronic obstructive pulmonary disease, unspecified: J44.9

## 2011-01-13 HISTORY — DX: Post-traumatic stress disorder, unspecified: F43.10

## 2011-01-13 HISTORY — DX: Spinal stenosis, site unspecified: M48.00

## 2011-01-13 HISTORY — DX: Polyneuropathy, unspecified: G62.9

## 2011-01-13 LAB — DIFFERENTIAL
Basophils Absolute: 0 10*3/uL (ref 0.0–0.1)
Eosinophils Absolute: 0.2 10*3/uL (ref 0.0–0.7)
Eosinophils Relative: 2 % (ref 0–5)
Lymphocytes Relative: 12 % (ref 12–46)
Monocytes Absolute: 0.8 10*3/uL (ref 0.1–1.0)

## 2011-01-13 LAB — COMPREHENSIVE METABOLIC PANEL
AST: 16 U/L (ref 0–37)
Albumin: 4.2 g/dL (ref 3.5–5.2)
Calcium: 10.5 mg/dL (ref 8.4–10.5)
Chloride: 101 mEq/L (ref 96–112)
Creatinine, Ser: 1.12 mg/dL (ref 0.50–1.35)
Sodium: 137 mEq/L (ref 135–145)

## 2011-01-13 LAB — PROTIME-INR: INR: 2.37 — ABNORMAL HIGH (ref 0.00–1.49)

## 2011-01-13 LAB — CBC
HCT: 39.6 % (ref 39.0–52.0)
MCH: 33 pg (ref 26.0–34.0)
MCV: 100.5 fL — ABNORMAL HIGH (ref 78.0–100.0)
RDW: 14.2 % (ref 11.5–15.5)
WBC: 9.7 10*3/uL (ref 4.0–10.5)

## 2011-01-13 MED ORDER — HYDROMORPHONE HCL PF 1 MG/ML IJ SOLN: 1.0000 mg | INTRAMUSCULAR | Status: DC | PRN

## 2011-01-13 MED ORDER — ALLOPURINOL 300 MG PO TABS
300.0000 mg | ORAL_TABLET | Freq: Every day | ORAL | Status: DC
  Administered 2011-01-13 – 2011-01-15 (×3): 300 mg via ORAL
  Filled 2011-01-13 (×3): qty 1

## 2011-01-13 MED ORDER — DILTIAZEM HCL ER COATED BEADS 120 MG PO CP24
120.0000 mg | ORAL_CAPSULE | Freq: Every day | ORAL | Status: DC
  Administered 2011-01-13 – 2011-01-14 (×2): 120 mg via ORAL
  Filled 2011-01-13 (×3): qty 1

## 2011-01-13 MED ORDER — LEVOTHYROXINE SODIUM 100 MCG PO TABS
200.0000 ug | ORAL_TABLET | Freq: Every day | ORAL | Status: DC
  Administered 2011-01-13 – 2011-01-15 (×3): 200 ug via ORAL
  Filled 2011-01-13 (×3): qty 2

## 2011-01-13 MED ORDER — PIPERACILLIN-TAZOBACTAM 3.375 G IVPB
INTRAVENOUS | Status: AC
  Filled 2011-01-13: qty 50

## 2011-01-13 MED ORDER — PIPERACILLIN-TAZOBACTAM 3.375 G IVPB
3.3750 g | Freq: Once | INTRAVENOUS | Status: AC
  Administered 2011-01-13: 3.375 g via INTRAVENOUS
  Filled 2011-01-13: qty 50

## 2011-01-13 MED ORDER — ACETAMINOPHEN 325 MG PO TABS: 650.0000 mg | ORAL_TABLET | Freq: Four times a day (QID) | ORAL | Status: DC | PRN

## 2011-01-13 MED ORDER — ONDANSETRON HCL 4 MG PO TABS: 4.0000 mg | ORAL_TABLET | Freq: Four times a day (QID) | ORAL | Status: DC | PRN

## 2011-01-13 MED ORDER — METFORMIN HCL 500 MG PO TABS
500.0000 mg | ORAL_TABLET | Freq: Two times a day (BID) | ORAL | Status: DC
  Administered 2011-01-13 – 2011-01-14 (×2): 500 mg via ORAL
  Filled 2011-01-13 (×2): qty 1

## 2011-01-13 MED ORDER — LISINOPRIL 5 MG PO TABS
5.0000 mg | ORAL_TABLET | Freq: Every day | ORAL | Status: DC
  Administered 2011-01-13 – 2011-01-14 (×2): 5 mg via ORAL
  Filled 2011-01-13 (×3): qty 1

## 2011-01-13 MED ORDER — WARFARIN SODIUM 5 MG PO TABS: 5.0000 mg | ORAL_TABLET | Freq: Every day | ORAL | Status: DC

## 2011-01-13 MED ORDER — CITALOPRAM HYDROBROMIDE 20 MG PO TABS
20.0000 mg | ORAL_TABLET | Freq: Every day | ORAL | Status: DC
  Administered 2011-01-13 – 2011-01-15 (×3): 20 mg via ORAL
  Filled 2011-01-13 (×3): qty 1

## 2011-01-13 MED ORDER — SENNA 8.6 MG PO TABS
2.0000 | ORAL_TABLET | Freq: Every day | ORAL | Status: DC
  Administered 2011-01-14 – 2011-01-15 (×2): 17.2 mg via ORAL
  Filled 2011-01-13 (×6): qty 2

## 2011-01-13 MED ORDER — INSULIN ASPART 100 UNIT/ML ~~LOC~~ SOLN: 0.0000 [IU] | Freq: Every day | SUBCUTANEOUS | Status: DC

## 2011-01-13 MED ORDER — AMMONIUM LACTATE 12 % EX LOTN
1.0000 "application " | TOPICAL_LOTION | CUTANEOUS | Status: DC | PRN
  Filled 2011-01-13: qty 400

## 2011-01-13 MED ORDER — VANCOMYCIN HCL 1000 MG IV SOLR
1250.0000 mg | Freq: Two times a day (BID) | INTRAVENOUS | Status: DC
  Filled 2011-01-13 (×2): qty 1250

## 2011-01-13 MED ORDER — HYDROCODONE-ACETAMINOPHEN 5-325 MG PO TABS
1.0000 | ORAL_TABLET | Freq: Four times a day (QID) | ORAL | Status: DC | PRN
  Administered 2011-01-13: 1 via ORAL
  Filled 2011-01-13: qty 1

## 2011-01-13 MED ORDER — ACETAMINOPHEN 650 MG RE SUPP: 650.0000 mg | Freq: Four times a day (QID) | RECTAL | Status: DC | PRN

## 2011-01-13 MED ORDER — WARFARIN SODIUM 7.5 MG PO TABS
7.5000 mg | ORAL_TABLET | Freq: Once | ORAL | Status: AC
  Administered 2011-01-14: 7.5 mg via ORAL
  Filled 2011-01-13: qty 1

## 2011-01-13 MED ORDER — INSULIN ASPART 100 UNIT/ML ~~LOC~~ SOLN
0.0000 [IU] | Freq: Three times a day (TID) | SUBCUTANEOUS | Status: DC
  Administered 2011-01-14 – 2011-01-15 (×5): 2 [IU] via SUBCUTANEOUS
  Filled 2011-01-13: qty 3

## 2011-01-13 MED ORDER — ALBUTEROL SULFATE HFA 108 (90 BASE) MCG/ACT IN AERS
2.0000 | INHALATION_SPRAY | Freq: Four times a day (QID) | RESPIRATORY_TRACT | Status: DC
  Administered 2011-01-13 – 2011-01-15 (×5): 2 via RESPIRATORY_TRACT
  Filled 2011-01-13: qty 6.7

## 2011-01-13 MED ORDER — FINASTERIDE 5 MG PO TABS
5.0000 mg | ORAL_TABLET | Freq: Every day | ORAL | Status: DC
  Administered 2011-01-14 – 2011-01-15 (×2): 5 mg via ORAL
  Filled 2011-01-13 (×4): qty 1

## 2011-01-13 MED ORDER — VANCOMYCIN HCL IN DEXTROSE 1-5 GM/200ML-% IV SOLN
1000.0000 mg | Freq: Once | INTRAVENOUS | Status: AC
  Administered 2011-01-13: 1000 mg via INTRAVENOUS
  Filled 2011-01-13: qty 200

## 2011-01-13 MED ORDER — FUROSEMIDE 20 MG PO TABS
20.0000 mg | ORAL_TABLET | Freq: Every day | ORAL | Status: DC
  Administered 2011-01-13 – 2011-01-15 (×3): 20 mg via ORAL
  Filled 2011-01-13 (×3): qty 1

## 2011-01-13 MED ORDER — COLCHICINE 0.6 MG PO TABS
0.6000 mg | ORAL_TABLET | Freq: Every day | ORAL | Status: DC
  Administered 2011-01-13 – 2011-01-15 (×3): 0.6 mg via ORAL
  Filled 2011-01-13 (×3): qty 1

## 2011-01-13 MED ORDER — PIPERACILLIN-TAZOBACTAM 3.375 G IVPB
3.3750 g | Freq: Three times a day (TID) | INTRAVENOUS | Status: DC
  Filled 2011-01-13 (×2): qty 50

## 2011-01-13 MED ORDER — SODIUM CHLORIDE 0.9 % IJ SOLN
3.0000 mL | Freq: Two times a day (BID) | INTRAMUSCULAR | Status: DC
  Administered 2011-01-14 (×2): 3 mL via INTRAVENOUS
  Filled 2011-01-13: qty 3

## 2011-01-13 MED ORDER — ASPIRIN EC 81 MG PO TBEC
81.0000 mg | DELAYED_RELEASE_TABLET | Freq: Every day | ORAL | Status: DC
  Administered 2011-01-13 – 2011-01-15 (×3): 81 mg via ORAL
  Filled 2011-01-13 (×3): qty 1

## 2011-01-13 MED ORDER — SIMVASTATIN 20 MG PO TABS
20.0000 mg | ORAL_TABLET | Freq: Every day | ORAL | Status: DC
  Administered 2011-01-13: 20 mg via ORAL
  Filled 2011-01-13: qty 1

## 2011-01-13 MED ORDER — SODIUM CHLORIDE 0.9 % IV BOLUS (SEPSIS)
500.0000 mL | Freq: Once | INTRAVENOUS | Status: AC
  Administered 2011-01-13: 500 mL via INTRAVENOUS

## 2011-01-13 MED ORDER — GLIPIZIDE ER 2.5 MG PO TB24
2.5000 mg | ORAL_TABLET | Freq: Every day | ORAL | Status: DC
  Administered 2011-01-14 – 2011-01-15 (×2): 2.5 mg via ORAL
  Filled 2011-01-13 (×4): qty 1

## 2011-01-13 MED ORDER — PANTOPRAZOLE SODIUM 40 MG PO TBEC
40.0000 mg | DELAYED_RELEASE_TABLET | Freq: Every day | ORAL | Status: DC
  Administered 2011-01-13 – 2011-01-15 (×3): 40 mg via ORAL
  Filled 2011-01-13 (×3): qty 1

## 2011-01-13 MED ORDER — PIPERACILLIN-TAZOBACTAM 3.375 G IVPB
3.3750 g | Freq: Three times a day (TID) | INTRAVENOUS | Status: DC
  Administered 2011-01-14 – 2011-01-15 (×4): 3.375 g via INTRAVENOUS
  Filled 2011-01-13 (×8): qty 50

## 2011-01-13 MED ORDER — GABAPENTIN 100 MG PO CAPS
100.0000 mg | ORAL_CAPSULE | Freq: Three times a day (TID) | ORAL | Status: DC
  Administered 2011-01-13: 100 mg via ORAL
  Filled 2011-01-13: qty 1

## 2011-01-13 MED ORDER — VANCOMYCIN HCL 1000 MG IV SOLR
1250.0000 mg | Freq: Two times a day (BID) | INTRAVENOUS | Status: DC
  Administered 2011-01-14 – 2011-01-15 (×3): 1250 mg via INTRAVENOUS
  Filled 2011-01-13 (×3): qty 1250

## 2011-01-13 MED ORDER — SODIUM CHLORIDE 0.9 % IV SOLN
Freq: Once | INTRAVENOUS | Status: AC
  Administered 2011-01-13: 18:00:00 via INTRAVENOUS

## 2011-01-13 MED ORDER — ONDANSETRON HCL 4 MG/2ML IJ SOLN: 4.0000 mg | Freq: Four times a day (QID) | INTRAMUSCULAR | Status: DC | PRN

## 2011-01-13 MED ORDER — GUAIFENESIN ER 600 MG PO TB12
600.0000 mg | ORAL_TABLET | Freq: Two times a day (BID) | ORAL | Status: DC
  Administered 2011-01-13 – 2011-01-15 (×4): 600 mg via ORAL
  Filled 2011-01-13 (×3): qty 1

## 2011-01-13 NOTE — ED Provider Notes (Addendum)
History     CSN: [id number] Arrival date & time: 01/13/2011  4:13 PM   First MD Initiated Contact with Patient 01/13/11 1624      Chief Complaint  Patient presents with  . Toe Pain    (Consider location/radiation/quality/duration/timing/severity/associated sxs/prior treatment) HPI Comments: Tony [name] is a 65 y.o. male with a h/o of hypothyroidism, diabetes with neuropathy, spinal stenosis with limited mobility, COPD, hypertension, atrial fibrillation on chronic coumadin therapy,  CHF who presents to the Emergency Department complaining of blister and infection to right third toe that began two days ago. Patient is normally followed by the [place]. He reports that he does not wear shoes or sock due to his inability to reach his feet. He wear rubber slide on slippers. He noticed two days ago that he had a blister and sore to the top of his right third toe. It has gotten worse over the lst two days and is now weeping, swollen, erythematous. He denies fever, chills. He has significant neuropathy and marked decreased sensation in both feet.   Patient is a 65 y.o. male presenting with toe pain.  Toe Pain    Past Medical History  Diagnosis Date  . Neuropathy   . Spinal stenosis   . COPD (chronic obstructive pulmonary disease)   . Diabetes mellitus     History reviewed. No pertinent past surgical history.  No family history on file.  History  Substance Use Topics  . Smoking status: Never Smoker   . Smokeless tobacco: Not on file  . Alcohol Use: No      Review of Systems  Allergies  Terazosin  Home Medications   Current Outpatient Rx  Name Route Sig Dispense Refill  . ALBUTEROL SULFATE HFA 108 (90 BASE) MCG/ACT IN AERS Inhalation Inhale 2 puffs into the lungs every 6 (six) hours as needed. Shortness of breath     . ALLOPURINOL 300 MG PO TABS Oral Take 300 mg by mouth daily.      . ASPIRIN EC 81 MG PO TBEC Oral Take 81 mg by mouth daily.      Marland Kitchen CITALOPRAM HYDROBROMIDE 40  MG PO TABS Oral Take 20 mg by mouth daily.      Marland Kitchen DILTIAZEM HCL COATED BEADS 120 MG PO CP24 Oral Take 120 mg by mouth daily.      Marland Kitchen FINASTERIDE 5 MG PO TABS Oral Take 5 mg by mouth daily.      . FUROSEMIDE 20 MG PO TABS Oral Take 20 mg by mouth daily.      Marland Kitchen GABAPENTIN 100 MG PO CAPS Oral Take 100-200 mg by mouth 3 (three) times daily. Patient takes 1 capsule every morning and 1 capsule at noon and 2 capsules at bedtime     . GLIPIZIDE ER 5 MG PO TB24 Oral Take 2.5 mg by mouth daily.      Marland Kitchen HYDROCODONE-ACETAMINOPHEN 5-500 MG PO TABS Oral Take 1 tablet by mouth every 6 (six) hours as needed. pain     . LEVOTHYROXINE SODIUM 100 MCG PO TABS Oral Take 200 mcg by mouth daily.      Marland Kitchen LISINOPRIL 10 MG PO TABS Oral Take 5 mg by mouth daily.      Marland Kitchen METFORMIN HCL 500 MG PO TABS Oral Take 500 mg by mouth 2 (two) times daily.      Marland Kitchen OMEPRAZOLE 20 MG PO CPDR Oral Take 20 mg by mouth at bedtime.      Marland Kitchen RANITIDINE HCL 150 MG  PO CAPS Oral Take 150 mg by mouth 2 (two) times daily.      . SENNOSIDES 8.6 MG PO TABS Oral Take 2 tablets by mouth daily. For constipation     . SIMVASTATIN 40 MG PO TABS Oral Take 20 mg by mouth at bedtime.      . TERBINAFINE HCL 250 MG PO TABS Oral Take 250 mg by mouth daily.      . WARFARIN SODIUM 5 MG PO TABS Oral Take 5-7.5 mg by mouth daily. Patient takes 1 tablet(5mg ) on Mon.,Wed.,fri., then patient takes 1&1/2 tablet(7.5mg ) on Sun.,Tues.,Thur.,Sat.       BP 100/60  Pulse 72  Temp(Src) 98.4 F (36.9 C) (Oral)  Resp 22  Ht 6' (1.829 m)  Wt 245 lb (111.131 kg)  BMI 33.23 kg/m2  SpO2 100% ROS: 10 Systems reviewed and are negative for acute change except as noted in the HPI. Physical Exam  Nursing note and vitals reviewed. Constitutional: He is oriented to person, place, and time. He appears well-developed and well-nourished. No distress.       Chronically appearing man in no acute distress.  HENT:  Head: Normocephalic and atraumatic.  Eyes: EOM are normal.  Neck: Normal  range of motion. Neck supple. No JVD present. No thyromegaly present.  Cardiovascular: Normal heart sounds and intact distal pulses.        Irregularly irregular rhythm  Pulmonary/Chest: Effort normal. He exhibits no tenderness.       Coughing, rales at both bases.  Abdominal: Soft. He exhibits no distension. There is no tenderness. There is no rebound.  Musculoskeletal:       2+ edema to right leg, 1+ to left leg.  Neurological: He is alert and oriented to person, place, and time. No cranial nerve deficit.  Skin: Skin is warm and dry.       Right third toe with a broken blister on the top of the toe, swelling, clear and serosanguinous drainage with surrounding erythema.    ED Course  Procedures (including critical care time)  Results for orders placed during the hospital encounter of 01/13/11  CBC      Component Value Range   WBC 9.7  4.0 - 10.5 (K/uL)   RBC 3.94 (*) 4.22 - 5.81 (MIL/uL)   Hemoglobin 13.0  13.0 - 17.0 (g/dL)   HCT 40.9  81.1 - 91.4 (%)   MCV 100.5 (*) 78.0 - 100.0 (fL)   MCH 33.0  26.0 - 34.0 (pg)   MCHC 32.8  30.0 - 36.0 (g/dL)   RDW 78.2  95.6 - 21.3 (%)   Platelets 199  150 - 400 (K/uL)  DIFFERENTIAL      Component Value Range   Neutrophils Relative 78 (*) 43 - 77 (%)   Neutro Abs 7.5  1.7 - 7.7 (K/uL)   Lymphocytes Relative 12  12 - 46 (%)   Lymphs Abs 1.2  0.7 - 4.0 (K/uL)   Monocytes Relative 8  3 - 12 (%)   Monocytes Absolute 0.8  0.1 - 1.0 (K/uL)   Eosinophils Relative 2  0 - 5 (%)   Eosinophils Absolute 0.2  0.0 - 0.7 (K/uL)   Basophils Relative 0  0 - 1 (%)   Basophils Absolute 0.0  0.0 - 0.1 (K/uL)  CULTURE, BLOOD (ROUTINE X 2)      Component Value Range   Specimen Description BLOOD BLOOD LEFT HAND     Special Requests BOTTLES DRAWN AEROBIC ONLY 5CC  Culture PENDING     Report Status PENDING    CULTURE, BLOOD (ROUTINE X 2)      Component Value Range   Specimen Description BLOOD BLOOD LEFT ARM     Special Requests BOTTLES DRAWN AEROBIC  AND ANAEROBIC 6CC     Culture PENDING     Report Status PENDING    COMPREHENSIVE METABOLIC PANEL      Component Value Range   Sodium 137  135 - 145 (mEq/L)   Potassium 4.1  3.5 - 5.1 (mEq/L)   Chloride 101  96 - 112 (mEq/L)   CO2 27  19 - 32 (mEq/L)   Glucose, Bld 87  70 - 99 (mg/dL)   BUN 20  6 - 23 (mg/dL)   Creatinine, Ser 2.13  0.50 - 1.35 (mg/dL)   Calcium 08.6  8.4 - 10.5 (mg/dL)   Total Protein 7.9  6.0 - 8.3 (g/dL)   Albumin 4.2  3.5 - 5.2 (g/dL)   AST 16  0 - 37 (U/L)   ALT 12  0 - 53 (U/L)   Alkaline Phosphatase 69  39 - 117 (U/L)   Total Bilirubin 0.5  0.3 - 1.2 (mg/dL)   GFR calc non Af Amer 67 (*) >90 (mL/min)   GFR calc Af Amer 78 (*) >90 (mL/min)  PROTIME-INR      Component Value Range   Prothrombin Time 26.3 (*) 11.6 - 15.2 (seconds)   INR 2.37 (*) 0.00 - 1.49    Dg Chest Port 1 View  01/13/2011  *RADIOLOGY REPORT*  Clinical Data: Cough.  Congestion.  Infection.  Diabetic foot ulcers.  PORTABLE CHEST - 1 VIEW  Comparison: 05/07/2007.  Findings: Cardiomegaly is present.  Subsegmental atelectasis and scarring in the left midlung and at both bases.  There is no airspace disease or effusion.  IMPRESSION: Cardiomegaly without failure.  Original Report Authenticated By: Andreas Newport, M.D.    Date: 01/13/2011  1710  Rate: 84  Rhythm: atrial fibrillation  QRS Axis: normal  Intervals: atrial fibrillation  ST/T Wave abnormalities: nonspecific T wave changes  Conduction Disutrbances:none  Narrative Interpretation:   Old EKG Reviewed: unchanged since 05/07/2007, rate slower   No diagnosis found.    MDM  Patient with multiple medical problems here with a diabetic ulcer on the right third toe. Labs unremarkable. Patient is on chronic coumadin with therapeutic INR. IVFs begun, antibiotics given. Will arrange for admission. 2005 Spoke with Dr. Rito Ehrlich, hospitalist who has accepted the patient for admission.Pt stable in ED with no significant deterioration in  condition.The patient appears reasonably stabilized for admission considering the current resources, flow, and capabilities available in the ED at this time, and I doubt any other [facility] requiring further screening and/or treatment in the ED prior to admission.  CRITICAL CARE Performed by: Annamarie Dawley.  ?  Total critical care time: 40  Critical care time was exclusive of separately billable procedures and treating other patients.  Critical care was necessary to treat or prevent imminent or life-threatening deterioration.  Critical care was time spent personally by me on the following activities: development of treatment plan with patient and/or surrogate as well as nursing, discussions with consultants, evaluation of patient's response to treatment, examination of patient, obtaining history from patient or surrogate, ordering and performing treatments and interventions, ordering and review of laboratory studies, ordering and review of radiographic studies, pulse oximetry and re-evaluation of patient's condition.        Nicoletta Dress. Colon Branch, MD 01/13/11 2009  Aurther Loft  Velia Meyer, MD 01/13/11 2017

## 2011-01-13 NOTE — ED Notes (Signed)
Attempted to call report, was told the nurse would have to call me back. 

## 2011-01-13 NOTE — Progress Notes (Signed)
ANTIBIOTIC CONSULT NOTE - INITIAL  Pharmacy Consult for Vancomycin and Zosyn Indication: cellulitis  Allergies  Allergen Reactions  . Terazosin     Patient Measurements: Height: 6' (182.9 cm) Weight: 245 lb (111.131 kg) IBW/kg (Calculated) : 77.6   Vital Signs: Temp: 99.7 F (37.6 C) (11/25 2025) Temp src: Oral (11/25 2025) BP: 100/70 mmHg (11/25 2025) Pulse Rate: 98  (11/25 2025) Intake/Output from previous day:   Intake/Output from this shift:    Labs:  Basename 01/13/11 1700  WBC 9.7  HGB 13.0  PLT 199  LABCREA --  CREATININE 1.12   Estimated Creatinine Clearance: 84.6 ml/min (by C-G formula based on Cr of 1.12). No results found for this basename: VANCOTROUGH:2,VANCOPEAK:2,VANCORANDOM:2,GENTTROUGH:2,GENTPEAK:2,GENTRANDOM:2,TOBRATROUGH:2,TOBRAPEAK:2,TOBRARND:2,AMIKACINPEAK:2,AMIKACINTROU:2,AMIKACIN:2, in the last 72 hours   Microbiology: Recent Results (from the past 720 hour(s))  CULTURE, BLOOD (ROUTINE X 2)     Status: Normal (Preliminary result)   Collection Time   01/13/11  5:20 PM      Component Value Range Status Comment   Specimen Description BLOOD BLOOD LEFT ARM   Final    Special Requests BOTTLES DRAWN AEROBIC AND ANAEROBIC 6CC   Final    Culture PENDING   Incomplete    Report Status PENDING   Incomplete   CULTURE, BLOOD (ROUTINE X 2)     Status: Normal (Preliminary result)   Collection Time   01/13/11  5:45 PM      Component Value Range Status Comment   Specimen Description BLOOD BLOOD LEFT HAND   Final    Special Requests BOTTLES DRAWN AEROBIC ONLY 5CC   Final    Culture PENDING   Incomplete    Report Status PENDING   Incomplete     Medical History: Past Medical History  Diagnosis Date  . Neuropathy   . Spinal stenosis   . COPD (chronic obstructive pulmonary disease)   . Diabetes mellitus     Medications:  Scheduled:    . sodium chloride   Intravenous Once  . albuterol  2 puff Inhalation Q6H  . allopurinol  300 mg Oral Daily  .  aspirin EC  81 mg Oral Daily  . citalopram  20 mg Oral Daily  . colchicine  0.6 mg Oral Daily  . diltiazem  120 mg Oral Daily  . finasteride  5 mg Oral Daily  . furosemide  20 mg Oral Daily  . gabapentin  100-200 mg Oral TID  . glipiZIDE  2.5 mg Oral Daily  . guaiFENesin  600 mg Oral BID  . insulin aspart  0-15 Units Subcutaneous TID WC  . insulin aspart  0-5 Units Subcutaneous QHS  . levothyroxine  200 mcg Oral Daily  . lisinopril  5 mg Oral Daily  . metFORMIN  500 mg Oral BID  . pantoprazole  40 mg Oral Q1200  . piperacillin-tazobactam (ZOSYN)  IV  3.375 g Intravenous Once  . piperacillin-tazobactam (ZOSYN)  IV  3.375 g Intravenous Q8H  . senna  2 tablet Oral Daily  . simvastatin  20 mg Oral QHS  . sodium chloride  500 mL Intravenous Once  . sodium chloride  3 mL Intravenous Q12H  . vancomycin  1,250 mg Intravenous Q12H  . vancomycin  1,000 mg Intravenous Once  . warfarin  5-7.5 mg Oral Daily  . DISCONTD: piperacillin-tazobactam (ZOSYN)  IV  3.375 g Intravenous Q8H  . DISCONTD: vancomycin  1,250 mg Intravenous Q12H   Assessment: Ok for protocol   Goal of Therapy:  Vancomycin trough level 10-15 mcg/ml  Plan:  Vancomycin 1250 mg IV every 12 hours Zosyn 3.375 GM IV every 8 hours Vancomycin level at steady state Monitor renal function  Tony [name], Tony [name] 01/13/2011,10:07 PM

## 2011-01-13 NOTE — H&P (Signed)
Tony [name] is an 65 y.o. male.    PCP: [facility] Complaint: Redness of the third toe on the right foot  HPI: This is a 65 year old, Caucasian male, with a past with history of, diabetes, A. fib, hypercholesterolemia, gout, who was in his usual state of health till 2 days ago, when he started noticing that his right third toe was swelling up. He noticed some redness on the toe. And, then over the course of the last 2, days the redness has gotten worse. The swelling has gotten worse. He denies any pain because he has severe neuropathy. He has been able to ambulate. He had similar infection about a year ago, involving his second right toe and was hospitalized at that time. He, says that he almost lost his toe at that time. Denies any fever. Had some nausea today. Has had the a cough with yellowish expectoration for the last few days. Denies any shortness of breath or chest pain. He has a doctor's appointment on Tuesday, at the [place].   Prior to Admission medications   Medication Sig Start Date End Date Taking? Authorizing Provider  albuterol (PROVENTIL HFA;VENTOLIN HFA) 108 (90 BASE) MCG/ACT inhaler Inhale 2 puffs into the lungs every 6 (six) hours as needed. Shortness of breath    Yes Historical Provider, MD  allopurinol (ZYLOPRIM) 300 MG tablet Take 300 mg by mouth daily.     Yes Historical Provider, MD  ammonium lactate (LAC-HYDRIN) 12 % lotion Apply 1 application topically as needed. To dry skin    Yes Historical Provider, MD  aspirin EC 81 MG tablet Take 81 mg by mouth daily.     Yes Historical Provider, MD  Calcium Carbonate-Vitamin D (CALCIUM 600 + D PO) Take 1 tablet by mouth daily.     Yes Historical Provider, MD  capsaicin (ZOSTRIX) 0.025 % cream Apply 1 application topically 2 (two) times daily as needed.     Yes Historical Provider, MD  citalopram (CELEXA) 40 MG tablet Take 20 mg by mouth daily.     Yes Historical Provider, MD  colchicine 0.6 MG tablet Take 0.6 mg by mouth daily.      Yes Historical Provider, MD  diltiazem (CARDIZEM CD) 120 MG 24 hr capsule Take 120 mg by mouth daily.     Yes Historical Provider, MD  finasteride (PROSCAR) 5 MG tablet Take 5 mg by mouth daily.     Yes Historical Provider, MD  furosemide (LASIX) 20 MG tablet Take 20 mg by mouth daily.     Yes Historical Provider, MD  gabapentin (NEURONTIN) 100 MG capsule Take 100-200 mg by mouth 3 (three) times daily. Patient takes 1 capsule every morning and 1 capsule at noon and 2 capsules at bedtime    Yes Historical Provider, MD  glipiZIDE (GLUCOTROL XL) 5 MG 24 hr tablet Take 2.5 mg by mouth daily.     Yes Historical Provider, MD  HYDROcodone-acetaminophen (VICODIN) 5-500 MG per tablet Take 1 tablet by mouth every 6 (six) hours as needed. pain    Yes Historical Provider, MD  levothyroxine (SYNTHROID, LEVOTHROID) 100 MCG tablet Take 200 mcg by mouth daily.     Yes Historical Provider, MD  lisinopril (PRINIVIL,ZESTRIL) 10 MG tablet Take 5 mg by mouth daily.     Yes Historical Provider, MD  metFORMIN (GLUCOPHAGE) 500 MG tablet Take 500 mg by mouth 2 (two) times daily.     Yes Historical Provider, MD  nystatin ointment (MYCOSTATIN) Apply 1 application topically  2 (two) times daily.     Yes Historical Provider, MD  omeprazole (PRILOSEC) 20 MG capsule Take 20 mg by mouth at bedtime.     Yes Historical Provider, MD  ranitidine (ZANTAC) 150 MG capsule Take 150 mg by mouth 2 (two) times daily.     Yes Historical Provider, MD  senna (SENOKOT) 8.6 MG tablet Take 2 tablets by mouth daily. For constipation    Yes Historical Provider, MD  simvastatin (ZOCOR) 40 MG tablet Take 20 mg by mouth at bedtime.     Yes Historical Provider, MD  terbinafine (LAMISIL) 250 MG tablet Take 250 mg by mouth daily.     Yes Historical Provider, MD  warfarin (COUMADIN) 5 MG tablet Take 5-7.5 mg by mouth daily. Patient takes 1 tablet(5mg ) on Mon.,Wed.,fri., then patient takes 1&1/2 tablet(7.5mg ) on Sun.,Tues.,Thur.,Sat.    Yes Historical  Provider, MD    Allergies:  Allergies  Allergen Reactions  . Terazosin     Past Medical History  Diagnosis Date  . Neuropathy   . Spinal stenosis   . COPD (chronic obstructive pulmonary disease)   . Diabetes mellitus     Past Surgical History  Procedure Date  . Cholecystectomy     Social History:  reports that he has never smoked. He does not have any smokeless tobacco history on file. He reports that he does not drink alcohol or use illicit drugs.  Family History:  Family History  Problem Relation Age of Onset  . Heart disease Mother   . Prostate cancer Brother   . Kidney cancer Brother     Review of Systems - History obtained from the patient General ROS: negative Psychological ROS: negative Allergy and Immunology ROS: negative Hematological and Lymphatic ROS: negative Endocrine ROS: negative Respiratory ROS: positive for - cough and wheezing Cardiovascular ROS: no chest pain or dyspnea on exertion Gastrointestinal ROS: no abdominal pain, change in bowel habits, or black or bloody stools Genito-Urinary ROS: negative Musculoskeletal ROS: as in HPI Neurological ROS: negative Dermatological ROS: As in Hpi  Physical Examination Blood pressure 100/70, pulse 98, temperature 99.7 F (37.6 C), temperature source Oral, resp. rate 20, height 6' (1.829 m), weight 111.131 kg (245 lb), SpO2 100.00%.  General appearance: alert, cooperative, appears stated age and no distress Head: Normocephalic, without obvious abnormality, atraumatic Eyes: conjunctivae/corneas clear. PERRL, EOM's intact. Fundi benign. Throat: lips, mucosa, and tongue normal; teeth and gums normal Neck: no adenopathy, no carotid bruit, no JVD, supple, symmetrical, trachea midline and thyroid not enlarged, symmetric, no tenderness/mass/nodules Resp: clear to auscultation bilaterally Cardio: irregularly irregular rhythm, no S3 or S4 and no rub GI: soft, non-tender; bowel sounds normal; no masses,  no  organomegaly Extremities: Redness over right foot area. Right 3rd toe is swollen and erythematous. Poor pulses.warm to touch Pulses: poor peripheral pulses Skin: redness over right foot with chronic skin changes over right leg Lymph nodes: Cervical, supraclavicular, and axillary nodes normal. and no inguinal ln Neurologic: Alert and oriented X 3, normal strength and tone. Normal symmetric reflexes. Normal coordination and gait Incision/Wound:Right 3rd toe swollen. Some oozing noted. No active drainage seen.  Results for orders placed during the hospital encounter of 01/13/11 (from the past 48 hour(s))  CBC     Status: Abnormal   Collection Time   01/13/11  5:00 PM      Component Value Range Comment   WBC 9.7  4.0 - 10.5 (K/uL)    RBC 3.94 (*) 4.22 - 5.81 (MIL/uL)  Hemoglobin 13.0  13.0 - 17.0 (g/dL)    HCT 40.9  81.1 - 91.4 (%)    MCV 100.5 (*) 78.0 - 100.0 (fL)    MCH 33.0  26.0 - 34.0 (pg)    MCHC 32.8  30.0 - 36.0 (g/dL)    RDW 78.2  95.6 - 21.3 (%)    Platelets 199  150 - 400 (K/uL)   DIFFERENTIAL     Status: Abnormal   Collection Time   01/13/11  5:00 PM      Component Value Range Comment   Neutrophils Relative 78 (*) 43 - 77 (%)    Neutro Abs 7.5  1.7 - 7.7 (K/uL)    Lymphocytes Relative 12  12 - 46 (%)    Lymphs Abs 1.2  0.7 - 4.0 (K/uL)    Monocytes Relative 8  3 - 12 (%)    Monocytes Absolute 0.8  0.1 - 1.0 (K/uL)    Eosinophils Relative 2  0 - 5 (%)    Eosinophils Absolute 0.2  0.0 - 0.7 (K/uL)    Basophils Relative 0  0 - 1 (%)    Basophils Absolute 0.0  0.0 - 0.1 (K/uL)   COMPREHENSIVE METABOLIC PANEL     Status: Abnormal   Collection Time   01/13/11  5:00 PM      Component Value Range Comment   Sodium 137  135 - 145 (mEq/L)    Potassium 4.1  3.5 - 5.1 (mEq/L)    Chloride 101  96 - 112 (mEq/L)    CO2 27  19 - 32 (mEq/L)    Glucose, Bld 87  70 - 99 (mg/dL)    BUN 20  6 - 23 (mg/dL)    Creatinine, Ser 0.86  0.50 - 1.35 (mg/dL)    Calcium 57.8  8.4 - 10.5  (mg/dL)    Total Protein 7.9  6.0 - 8.3 (g/dL)    Albumin 4.2  3.5 - 5.2 (g/dL)    AST 16  0 - 37 (U/L)    ALT 12  0 - 53 (U/L)    Alkaline Phosphatase 69  39 - 117 (U/L)    Total Bilirubin 0.5  0.3 - 1.2 (mg/dL)    GFR calc non Af Amer 67 (*) >90 (mL/min)    GFR calc Af Amer 78 (*) >90 (mL/min)   PROTIME-INR     Status: Abnormal   Collection Time   01/13/11  5:00 PM      Component Value Range Comment   Prothrombin Time 26.3 (*) 11.6 - 15.2 (seconds)    INR 2.37 (*) 0.00 - 1.49    CULTURE, BLOOD (ROUTINE X 2)     Status: Normal (Preliminary result)   Collection Time   01/13/11  5:20 PM      Component Value Range Comment   Specimen Description BLOOD BLOOD LEFT ARM      Special Requests BOTTLES DRAWN AEROBIC AND ANAEROBIC 6CC      Culture PENDING      Report Status PENDING     CULTURE, BLOOD (ROUTINE X 2)     Status: Normal (Preliminary result)   Collection Time   01/13/11  5:45 PM      Component Value Range Comment   Specimen Description BLOOD BLOOD LEFT HAND      Special Requests BOTTLES DRAWN AEROBIC ONLY 5CC      Culture PENDING      Report Status PENDING      Dg Chest Port 1  View  01/13/2011  *RADIOLOGY REPORT*  Clinical Data: Cough.  Congestion.  Infection.  Diabetic foot ulcers.  PORTABLE CHEST - 1 VIEW  Comparison: 05/07/2007.  Findings: Cardiomegaly is present.  Subsegmental atelectasis and scarring in the left midlung and at both bases.  There is no airspace disease or effusion.  IMPRESSION: Cardiomegaly without failure.  Original Report Authenticated By: Andreas Newport, M.D.   EKG shows A. fib at 84 beats per minute. Normal axis. Intervals appear to be in the normal range. No ischemic changes identified.  Assessment/Plan  Principal Problem:  *Cellulitis of toe of right foot Active Problems:  Cellulitis of right foot  Diabetic neuropathy  Bronchitis, acute  A-fib  Gout  Hypothyroidism  Diabetes type 2, controlled  COPD (chronic obstructive pulmonary  disease)   #1 cellulitis of the right third toe, as well as the right foot. We will proceed with x-rays of the right foot. He will put the patient on vancomycin and Zosyn for now. Culture reports from earlier this year showed that he had the MSSA infection involving the right second toe. Once he shows clinical improvement the antibiotics can be scaled down. Wound care nurse will be asked to see the patient as well. He may need orthopedic input. However, we can hold off on that for now.  #2 history of gout. Will go and check a uric acid level as there could be superimposed acute gouty inflammation.  #3 A. fib: This is a chronic issue. Rate is controlled. Continue diltiazem. Continue with warfarin per pharmacy.  #4 history of, diabetes, type II: Continue with sliding scale insulin. Continue with glipizide and metformin for now.  #5 cough/possible acute bronchitis: Chest x-ray does not show any pneumonia. We'll give him albuterol inhalers on a scheduled basis. Continue with the antibiotics as above. Given Mucinex as well.  #6 history of hypothyroidism. Continue with Synthroid.  #7 diabetic neuropathy. Continue with Neurontin and Vicodin.  Is a full code.  Further management decisions will depend on results of further testing and patient's response to treatment.  Jamieka Royle 01/13/2011, 8:48 PM

## 2011-01-13 NOTE — ED Notes (Signed)
Pt presents with right 3rd toe redness and swelling. Pt with edema to bilateral extremities.

## 2011-01-14 ENCOUNTER — Encounter (HOSPITAL_COMMUNITY): Payer: Self-pay

## 2011-01-14 LAB — COMPREHENSIVE METABOLIC PANEL
Albumin: 3.5 g/dL (ref 3.5–5.2)
BUN: 17 mg/dL (ref 6–23)
Creatinine, Ser: 1.19 mg/dL (ref 0.50–1.35)
Total Protein: 6.7 g/dL (ref 6.0–8.3)

## 2011-01-14 LAB — PROTIME-INR
INR: 2.41 — ABNORMAL HIGH (ref 0.00–1.49)
Prothrombin Time: 26.6 seconds — ABNORMAL HIGH (ref 11.6–15.2)

## 2011-01-14 LAB — CBC
HCT: 32.1 % — ABNORMAL LOW (ref 39.0–52.0)
Hemoglobin: 10.6 g/dL — ABNORMAL LOW (ref 13.0–17.0)
MCV: 100 fL (ref 78.0–100.0)
Platelets: 158 10*3/uL (ref 150–400)
RBC: 3.21 MIL/uL — ABNORMAL LOW (ref 4.22–5.81)
WBC: 7.8 10*3/uL (ref 4.0–10.5)

## 2011-01-14 LAB — GLUCOSE, CAPILLARY: Glucose-Capillary: 135 mg/dL — ABNORMAL HIGH (ref 70–99)

## 2011-01-14 MED ORDER — GABAPENTIN 100 MG PO CAPS
200.0000 mg | ORAL_CAPSULE | Freq: Every day | ORAL | Status: DC
  Administered 2011-01-14: 200 mg via ORAL

## 2011-01-14 MED ORDER — INSULIN GLARGINE 100 UNIT/ML ~~LOC~~ SOLN
SUBCUTANEOUS | Status: AC
  Administered 2011-01-14: 10 [IU] via SUBCUTANEOUS
  Filled 2011-01-14: qty 3

## 2011-01-14 MED ORDER — GABAPENTIN 100 MG PO CAPS
ORAL_CAPSULE | ORAL | Status: AC
  Administered 2011-01-14: 200 mg via ORAL
  Filled 2011-01-14: qty 2

## 2011-01-14 MED ORDER — INSULIN GLARGINE 100 UNIT/ML ~~LOC~~ SOLN
10.0000 [IU] | Freq: Every day | SUBCUTANEOUS | Status: DC
  Administered 2011-01-14: 10 [IU] via SUBCUTANEOUS

## 2011-01-14 MED ORDER — SODIUM CHLORIDE 0.9 % IJ SOLN
INTRAMUSCULAR | Status: AC
  Filled 2011-01-14: qty 3

## 2011-01-14 MED ORDER — GABAPENTIN 100 MG PO CAPS
100.0000 mg | ORAL_CAPSULE | Freq: Two times a day (BID) | ORAL | Status: DC
  Administered 2011-01-14 – 2011-01-15 (×3): 100 mg via ORAL
  Filled 2011-01-14 (×3): qty 1

## 2011-01-14 MED ORDER — ROSUVASTATIN CALCIUM 5 MG PO TABS
5.0000 mg | ORAL_TABLET | Freq: Every day | ORAL | Status: DC
  Administered 2011-01-14: 5 mg via ORAL
  Filled 2011-01-14: qty 1

## 2011-01-14 MED ORDER — WARFARIN SODIUM 5 MG PO TABS
5.0000 mg | ORAL_TABLET | Freq: Once | ORAL | Status: AC
  Administered 2011-01-14: 5 mg via ORAL
  Filled 2011-01-14: qty 1

## 2011-01-14 MED ORDER — GUAIFENESIN ER 600 MG PO TB12
ORAL_TABLET | ORAL | Status: AC
  Filled 2011-01-14: qty 1

## 2011-01-14 NOTE — Consult Note (Signed)
WOC consult Note Reason for Consult: Consult requested for right 3rd toe wound. Wound type: Full thickness wound.  Nail very loose and appears that it will fall off soon.  Nailbed consistent with fungal appearance, dry and crumbly, thick yellow nails.  Open wound near nail bed, .2X.2X.3cm when swab inserted, large amt thick tan pus drained. nonviable skin peeling around wound bed.  Trimmed with scissors and reveals red wound bed to area surrounding deeper wound, dry peeling skin to anterior toe. Pt on Vancomycin. Xray does not show osteomyelitis. 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  No topical treatment needed to this area at present time. Dressing procedure/placement/frequency: Aquacel to provide antimicrobial benefits and absorb drainage.  [facility] for fungal nail infection, trimming of non-viable toenail, and assessment of wound. Will not plan to follow further unless re-consulted.  [address] RN, MSN, [employer]  [phone]

## 2011-01-14 NOTE — Progress Notes (Signed)
We received a consult for eval.  Pt was interviewed and he seems to be at max functional status.  No PT needed at this time.  Please reconsult if status changes.

## 2011-01-14 NOTE — Progress Notes (Signed)
Subjective: Patient relates the pain is less and his right foot. Still swollen. Still has has drainage. Objective: Filed Vitals:   01/13/11 2211 01/13/11 2225 01/14/11 0600 01/14/11 0832  BP: 116/65  100/65   Pulse: 81  80   Temp: 100 F (37.8 C)  98 F (36.7 C)   TempSrc: Oral  Oral   Resp: 20  20   Height: 6' (1.829 m)     Weight: 113.8 kg (250 lb 14.1 oz)     SpO2: 99% 97% 97% 97%   Weight change:   Intake/Output Summary (Last 24 hours) at 01/14/11 1221 Last data filed at 01/14/11 0900  Gross per 24 hour  Intake    360 ml  Output    650 ml  Net   -290 ml    General: Alert, awake, oriented x3, in no acute distress.  HEENT: No bruits, no goiter.  Heart: Regular rate and rhythm, without murmurs, rubs, gallops.  Lungs: Crackles left side, bilateral air movement.  Abdomen: Soft, nontender, nondistended, positive bowel sounds.  Neuro: Grossly intact, nonfocal. Extremities: Right leg is more swollen than the left. There is still drainage, purulent and bloody from his third great toe. No painful to palpation.  Lab Results:  [facility] 01/14/11 0443 01/13/11 1700  NA 140 137  K 3.9 4.1  CL 103 101  CO2 27 27  GLUCOSE 123* 87  BUN 17 20  CREATININE 1.19 1.12  CALCIUM 9.6 10.5  MG -- --  PHOS -- --    Basename 01/14/11 0443 01/13/11 1700  AST 13 16  ALT 9 12  ALKPHOS 57 69  BILITOT 0.5 0.5  PROT 6.7 7.9  ALBUMIN 3.5 4.2   No results found for this basename: LIPASE:2,AMYLASE:2 in the last 72 hours  Basename 01/14/11 0443 01/13/11 1700  WBC 7.8 9.7  NEUTROABS -- 7.5  HGB 10.6* 13.0  HCT 32.1* 39.6  MCV 100.0 100.5*  PLT 158 199   No results found for this basename: CKTOTAL:3,CKMB:3,CKMBINDEX:3,TROPONINI:3 in the last 72 hours No results found for this basename: POCBNP:3 in the last 72 hours No results found for this basename: DDIMER:2 in the last 72 hours No results found for this basename: HGBA1C:2 in the last 72 hours No results found for this basename:  CHOL:2,HDL:2,LDLCALC:2,TRIG:2,CHOLHDL:2,LDLDIRECT:2 in the last 72 hours No results found for this basename: TSH,T4TOTAL,FREET3,T3FREE,THYROIDAB in the last 72 hours No results found for this basename: VITAMINB12:2,FOLATE:2,FERRITIN:2,TIBC:2,IRON:2,RETICCTPCT:2 in the last 72 hours  Micro Results: Recent Results (from the past 240 hour(s))  CULTURE, BLOOD (ROUTINE X 2)     Status: Normal (Preliminary result)   Collection Time   01/13/11  5:20 PM      Component Value Range Status Comment   Specimen Description BLOOD BLOOD LEFT ARM   Final    Special Requests BOTTLES DRAWN AEROBIC AND ANAEROBIC 6CC   Final    Culture NO GROWTH 1 DAY   Final    Report Status PENDING   Incomplete   CULTURE, BLOOD (ROUTINE X 2)     Status: Normal (Preliminary result)   Collection Time   01/13/11  5:45 PM      Component Value Range Status Comment   Specimen Description BLOOD BLOOD LEFT HAND   Final    Special Requests BOTTLES DRAWN AEROBIC ONLY 5CC   Final    Culture NO GROWTH 1 DAY   Final    Report Status PENDING   Incomplete     Studies/Results: Dg Chest Port 1 [facility]  01/13/2011  *RADIOLOGY REPORT*  Clinical Data: Cough.  Congestion.  Infection.  Diabetic foot ulcers.  PORTABLE CHEST - 1 VIEW  Comparison: 05/07/2007.  Findings: Cardiomegaly is present.  Subsegmental atelectasis and scarring in the left midlung and at both bases.  There is no airspace disease or effusion.  IMPRESSION: Cardiomegaly without failure.  Original Report Authenticated By: Andreas Newport, M.D.   Dg Foot Complete Right  01/13/2011  *RADIOLOGY REPORT*  Clinical Data: Right third toe swelling and erythema.  RIGHT FOOT COMPLETE - 3+ VIEW  Comparison: 04/19/2010  Findings: Erosive changes of the first interphalangeal joint are again noted with bony fragmentation and destruction present. Overall changes more pronounced compared to the prior x-ray.  No acute fracture or other areas of bony erosions.  Soft tissues are unremarkable.   IMPRESSION: No evidence by x-ray of osteomyelitis involving the third toe. Progression of advanced erosive changes of the first interphalangeal joint.  Original Report Authenticated By: Reola Calkins, M.D.    Medications: I have reviewed the patient's current medications.   Principal Problem:  *Cellulitis of toe of right foot Active Problems:  Cellulitis of right foot  Diabetic neuropathy  Bronchitis, acute  A-fib  Hypothyroidism  Diabetes type 2, controlled  COPD (chronic obstructive pulmonary disease)    Assessment and plan:  -Cellulitis of the right third toe and foot, continue vancomycin Zosyn day 1 of antibiotics send wound culture blood cultures pending. If continues to improve patient able to ambulate we'll DC Zosyn. We'll need wound care followup.  -Diabetic neuropathy, we'll get physical therapy.  -Chronic A. fib rate controlled continue Coumadin per pharmacy.  -Cough, resolved.  -Diabetes type 2, hemoglobin A1c pending, continue glipizide  and sliding scale insulin. We'll DC metformin in case any contrast as needed.  Start Lantus.  -COPD, currently stable.   LOS: 1 day   Marinda Elk M.D. Pager: [phone] [facility] 01/14/2011, 12:21 PM

## 2011-01-14 NOTE — Progress Notes (Signed)
ANTICOAGULATION CONSULT NOTE - Follow Up Consult  Pharmacy Consult for Warfarin Indication:  AFib (continuation of home therapy)  Allergies  Allergen Reactions  . Terazosin     Patient Measurements: Height: 6' (182.9 cm) Weight: 250 lb 14.1 oz (113.8 kg) IBW/kg (Calculated) : 77.6  Adjusted Body Weight: N/A  Vital Signs: Temp: 98 F (36.7 C) (11/26 0600) Temp src: Oral (11/26 0600) BP: 100/65 mmHg (11/26 0600) Pulse Rate: 80  (11/26 0600)  Labs:  Basename 01/14/11 0830 01/14/11 0443 01/13/11 1700  HGB -- 10.6* 13.0  HCT -- 32.1* 39.6  PLT -- 158 199  APTT -- -- --  LABPROT 26.6* -- 26.3*  INR 2.41* -- 2.37*  HEPARINUNFRC -- -- --  CREATININE -- 1.19 1.12  CKTOTAL -- -- --  CKMB -- -- --  TROPONINI -- -- --   Estimated Creatinine Clearance: 80.6 ml/min (by C-G formula based on Cr of 1.19).   Medications:  Scheduled:    . sodium chloride   Intravenous Once  . albuterol  2 puff Inhalation Q6H  . allopurinol  300 mg Oral Daily  . aspirin EC  81 mg Oral Daily  . citalopram  20 mg Oral Daily  . colchicine  0.6 mg Oral Daily  . diltiazem  120 mg Oral Daily  . finasteride  5 mg Oral Daily  . furosemide  20 mg Oral Daily  . gabapentin  100 mg Oral BID   And  . gabapentin  200 mg Oral QHS  . glipiZIDE  2.5 mg Oral Daily  . guaiFENesin  600 mg Oral BID  . insulin aspart  0-15 Units Subcutaneous TID WC  . insulin aspart  0-5 Units Subcutaneous QHS  . levothyroxine  200 mcg Oral Daily  . lisinopril  5 mg Oral Daily  . metFORMIN  500 mg Oral BID  . pantoprazole  40 mg Oral Q1200  . piperacillin-tazobactam (ZOSYN)  IV  3.375 g Intravenous Once  . piperacillin-tazobactam (ZOSYN)  IV  3.375 g Intravenous Q8H  . rosuvastatin  5 mg Oral q1800  . senna  2 tablet Oral Daily  . sodium chloride  500 mL Intravenous Once  . sodium chloride  3 mL Intravenous Q12H  . vancomycin  1,250 mg Intravenous Q12H  . vancomycin  1,000 mg Intravenous Once  . warfarin  7.5 mg Oral  Once  . DISCONTD: gabapentin  100-200 mg Oral TID  . DISCONTD: piperacillin-tazobactam (ZOSYN)  IV  3.375 g Intravenous Q8H  . DISCONTD: simvastatin  20 mg Oral QHS  . DISCONTD: vancomycin  1,250 mg Intravenous Q12H  . DISCONTD: warfarin  5-7.5 mg Oral Daily    Assessment: Therapeutic INR. Continuation of home dose for AFib.  Goal of Therapy:  INR 2-3   Plan:  Warfarin 5 mg today.  Gilman Buttner, [place] J 01/14/2011,10:37 AM

## 2011-01-14 NOTE — Progress Notes (Signed)
CARE MANAGEMENT NOTE 01/14/2011  Patient:  NATION, CRADLE   Account Number:  [id number]  Date Initiated:  01/14/2011  Documentation initiated by:  Anibal Henderson  Subjective/Objective Assessment:   Admitted with cellulitis of the foot and toe. pt is from home, lives with spouse. He will return home at D/C     Action/Plan:   Pt is a [place] pt, and will transfer to [place] when [facility]- [facility]   Anticipated DC Date:  01/15/2011   Anticipated DC Plan:  ACUTE TO ACUTE TRANS         Choice offered to / List presented to:             Status of service:  In process, will continue to follow Medicare Important Message given?   (If response is "NO", the following Medicare IM given date fields will be blank) Date Medicare IM given:   Date Additional Medicare IM given:    Discharge Disposition:    Per UR Regulation:    Comments:  01/14/11 1600 Anibal Henderson RN

## 2011-01-15 LAB — GLUCOSE, CAPILLARY: Glucose-Capillary: 148 mg/dL — ABNORMAL HIGH (ref 70–99)

## 2011-01-15 LAB — VANCOMYCIN, TROUGH: Vancomycin Tr: 16.5 ug/mL (ref 10.0–20.0)

## 2011-01-15 LAB — PROTIME-INR: INR: 2.37 — ABNORMAL HIGH (ref 0.00–1.49)

## 2011-01-15 MED ORDER — SULFAMETHOXAZOLE-TRIMETHOPRIM 800-160 MG PO TABS: 1.0000 | ORAL_TABLET | Freq: Two times a day (BID) | ORAL | Status: AC

## 2011-01-15 MED ORDER — VANCOMYCIN HCL IN DEXTROSE 1-5 GM/200ML-% IV SOLN
1000.0000 mg | Freq: Two times a day (BID) | INTRAVENOUS | Status: DC
  Filled 2011-01-15 (×2): qty 200

## 2011-01-15 MED ORDER — WARFARIN SODIUM 5 MG PO TABS: 5.0000 mg | ORAL_TABLET | Freq: Once | ORAL | Status: DC

## 2011-01-15 NOTE — Progress Notes (Signed)
Pt was able to ambulate in hall with quad cane independently with reasonable stability.  He appears to be at functional baseline.  No formal PT eval is needed.

## 2011-01-15 NOTE — Progress Notes (Signed)
ANTIBIOTIC CONSULT NOTE   Pharmacy Consult for Vancomycin and Zosyn Indication: cellulitis  Allergies  Allergen Reactions  . Terazosin    Patient Measurements: Height: 6' (182.9 cm) Weight: 182 lb 12.2 oz (82.9 kg) IBW/kg (Calculated) : 77.6   Vital Signs: Temp: 98.1 F (36.7 C) (11/27 0640) Temp src: Oral (11/27 0640) BP: 93/57 mmHg (11/27 0640) Pulse Rate: 60  (11/27 0640) Intake/Output from previous day: 11/26 0701 - 11/27 0700 In: 2243 [P.O.:1560; I.V.:3; IV Piggyback:680] Out: 1025 [Urine:1025] Intake/Output from this shift:    Labs:  [facility] 01/14/11 0443 01/13/11 1700  WBC 7.8 9.7  HGB 10.6* 13.0  PLT 158 199  LABCREA -- --  CREATININE 1.19 1.12   Estimated Creatinine Clearance: 67.9 ml/min (by C-G formula based on Cr of 1.19).  Basename 01/15/11 0334  VANCOTROUGH 16.5  VANCOPEAK --  VANCORANDOM --  GENTTROUGH --  GENTPEAK --  GENTRANDOM --  TOBRATROUGH --  TOBRAPEAK --  TOBRARND --  AMIKACINPEAK --  AMIKACINTROU --  AMIKACIN --    Microbiology: Recent Results (from the past 720 hour(s))  CULTURE, BLOOD (ROUTINE X 2)     Status: Normal (Preliminary result)   Collection Time   01/13/11  5:20 PM      Component Value Range Status Comment   Specimen Description BLOOD LEFT ARM   Final    Special Requests BOTTLES DRAWN AEROBIC AND ANAEROBIC 6CC   Final    Culture NO GROWTH 2 DAYS   Final    Report Status PENDING   Incomplete   CULTURE, BLOOD (ROUTINE X 2)     Status: Normal (Preliminary result)   Collection Time   01/13/11  5:45 PM      Component Value Range Status Comment   Specimen Description BLOOD LEFT HAND   Final    Special Requests BOTTLES DRAWN AEROBIC ONLY 5CC   Final    Culture NO GROWTH 2 DAYS   Final    Report Status PENDING   Incomplete   CULTURE, ROUTINE-ABSCESS     Status: Normal (Preliminary result)   Collection Time   01/14/11  1:26 PM      Component Value Range Status Comment   Specimen Description ABSCESS FOOT   Final     Special Requests NONE   Final    Gram Stain     Final    Value: NO WBC SEEN     NO SQUAMOUS EPITHELIAL CELLS SEEN     RARE GRAM POSITIVE COCCI     IN PAIRS   Culture NO GROWTH   Final    Report Status PENDING   Incomplete    Medical History: Past Medical History  Diagnosis Date  . Neuropathy   . COPD (chronic obstructive pulmonary disease)   . Diabetes mellitus   . Spinal stenosis   . A-fib   . A-fib   . A-fib   . PTSD (post-traumatic stress disorder)    Medications:  Scheduled:     . albuterol  2 puff Inhalation Q6H  . allopurinol  300 mg Oral Daily  . aspirin EC  81 mg Oral Daily  . citalopram  20 mg Oral Daily  . colchicine  0.6 mg Oral Daily  . diltiazem  120 mg Oral Daily  . finasteride  5 mg Oral Daily  . furosemide  20 mg Oral Daily  . gabapentin  100 mg Oral BID   And  . gabapentin  200 mg Oral QHS  . glipiZIDE  2.5 mg  Oral Daily  . guaiFENesin  600 mg Oral BID  . guaiFENesin      . insulin aspart  0-15 Units Subcutaneous TID WC  . insulin aspart  0-5 Units Subcutaneous QHS  . insulin glargine  10 Units Subcutaneous QHS  . levothyroxine  200 mcg Oral Daily  . lisinopril  5 mg Oral Daily  . pantoprazole  40 mg Oral Q1200  . piperacillin-tazobactam (ZOSYN)  IV  3.375 g Intravenous Q8H  . rosuvastatin  5 mg Oral q1800  . senna  2 tablet Oral Daily  . sodium chloride  3 mL Intravenous Q12H  . sodium chloride      . vancomycin  1,250 mg Intravenous Q12H  . warfarin  5 mg Oral ONCE-1800  . DISCONTD: metFORMIN  500 mg Oral BID   Assessment: Trough level slightly above goal  Goal of Therapy:  Vancomycin trough level 10-15 mcg/ml  Plan:  Reduce Vancomycin to 1000 mg IV every 12 hours Zosyn 3.375 GM IV every 8 hours Labs per protocol  Valrie Hart A 01/15/2011,10:56 AM

## 2011-01-15 NOTE — Progress Notes (Signed)
Utilization review completed.  

## 2011-01-15 NOTE — Progress Notes (Signed)
CARE MANAGEMENT NOTE 01/15/2011  Patient:  Tony [name], Tony [name]   Account Number:  [id number]  Date Initiated:  01/14/2011  Documentation initiated by:  Anibal Henderson  Subjective/Objective Assessment:   Admitted with cellulitis of the foot and toe. pt is from home, lives with spouse. He will return home at D/C     Action/Plan:   Pt is a [place] pt, and will transfer to [place] when bed available- [facility] and [facility]   Anticipated DC Date:  01/15/2011   Anticipated DC Plan:  ACUTE TO ACUTE TRANS      DC Planning Services  CM consult      Choice offered to / List presented to:             Status of service:  Completed, signed off Medicare Important Message given?   (If response is "NO", the following Medicare IM given date fields will be blank) Date Medicare IM given:   Date Additional Medicare IM given:    Discharge Disposition:  HOME/SELF CARE  Per UR Regulation:    Comments:  01/15/11 1100 Anibal Henderson RN Pt D/C home today- notified [facility] [place]. Pt to follow-up there in one week 01/14/11 1600 Anibal Henderson RN

## 2011-01-15 NOTE — Progress Notes (Signed)
ANTICOAGULATION CONSULT NOTE - Follow Up Consult  Pharmacy Consult for Warfarin Indication:  AFib (continuation of home therapy)  Allergies  Allergen Reactions  . Terazosin    Patient Measurements: Height: 6' (182.9 cm) Weight: 182 lb 12.2 oz (82.9 kg) IBW/kg (Calculated) : 77.6  Adjusted Body Weight: N/A  Vital Signs: Temp: 98.1 F (36.7 C) (11/27 0640) Temp src: Oral (11/27 0640) BP: 93/57 mmHg (11/27 0640) Pulse Rate: 60  (11/27 0640)  Labs:  Basename 01/15/11 0334 01/14/11 0830 01/14/11 0443 01/13/11 1700  HGB -- -- 10.6* 13.0  HCT -- -- 32.1* 39.6  PLT -- -- 158 199  APTT -- -- -- --  LABPROT 26.3* 26.6* -- 26.3*  INR 2.37* 2.41* -- 2.37*  HEPARINUNFRC -- -- -- --  CREATININE -- -- 1.19 1.12  CKTOTAL -- -- -- --  CKMB -- -- -- --  TROPONINI -- -- -- --   Estimated Creatinine Clearance: 67.9 ml/min (by C-G formula based on Cr of 1.19).   Medications:  Scheduled:     . albuterol  2 puff Inhalation Q6H  . allopurinol  300 mg Oral Daily  . aspirin EC  81 mg Oral Daily  . citalopram  20 mg Oral Daily  . colchicine  0.6 mg Oral Daily  . diltiazem  120 mg Oral Daily  . finasteride  5 mg Oral Daily  . furosemide  20 mg Oral Daily  . gabapentin  100 mg Oral BID   And  . gabapentin  200 mg Oral QHS  . glipiZIDE  2.5 mg Oral Daily  . guaiFENesin  600 mg Oral BID  . guaiFENesin      . insulin aspart  0-15 Units Subcutaneous TID WC  . insulin aspart  0-5 Units Subcutaneous QHS  . insulin glargine  10 Units Subcutaneous QHS  . levothyroxine  200 mcg Oral Daily  . lisinopril  5 mg Oral Daily  . pantoprazole  40 mg Oral Q1200  . piperacillin-tazobactam (ZOSYN)  IV  3.375 g Intravenous Q8H  . rosuvastatin  5 mg Oral q1800  . senna  2 tablet Oral Daily  . sodium chloride  3 mL Intravenous Q12H  . sodium chloride      . vancomycin  1,000 mg Intravenous Q12H  . warfarin  5 mg Oral ONCE-1800  . DISCONTD: metFORMIN  500 mg Oral BID  . DISCONTD: vancomycin  1,250  mg Intravenous Q12H    Assessment: Therapeutic INR. Continuation of home dose for AFib.  Goal of Therapy:  INR 2-3   Plan:  Warfarin 5 mg today.  Margo Aye, Janae Bonser A 01/15/2011,11:01 AM

## 2011-01-15 NOTE — Progress Notes (Signed)
Pt discharged home via family; Pt and family given and explained all discharge instructions, carenotes, and prescriptions; pt and family stated understanding and denied questions/concerns; all f/u appointments in place, home health arranged and in place; IV removed without complicaitons; pt stable at time of discharge

## 2011-01-15 NOTE — Discharge Summary (Signed)
Admit date: 01/13/2011 Discharge date: 01/15/2011  Primary Care Physician:  No primary provider on file.   Discharge Diagnoses:   Active Hospital Problems  Diagnoses Date Noted   . Cellulitis of toe of right foot 01/13/2011   . Cellulitis of right foot 01/13/2011   . A-fib 01/13/2011   . Diabetes type 2, controlled 01/13/2011   . COPD (chronic obstructive pulmonary disease) 01/13/2011     Resolved Hospital Problems  Diagnoses Date Noted Date Resolved  . Bronchitis, acute 01/13/2011 01/14/2011     DISCHARGE MEDICATION: Current Discharge Medication List    START taking these medications   Details  sulfamethoxazole-trimethoprim (BACTRIM DS) 800-160 MG per tablet Take 1 tablet by mouth 2 (two) times daily. Qty: 14 tablet, Refills: 0      CONTINUE these medications which have NOT CHANGED   Details  albuterol (PROVENTIL HFA;VENTOLIN HFA) 108 (90 BASE) MCG/ACT inhaler Inhale 2 puffs into the lungs every 6 (six) hours as needed. Shortness of breath     allopurinol (ZYLOPRIM) 300 MG tablet Take 300 mg by mouth daily.      ammonium lactate (LAC-HYDRIN) 12 % lotion Apply 1 application topically as needed. To dry skin     aspirin EC 81 MG tablet Take 81 mg by mouth daily.      Calcium Carbonate-Vitamin D (CALCIUM 600 + D PO) Take 1 tablet by mouth daily.      capsaicin (ZOSTRIX) 0.025 % cream Apply 1 application topically 2 (two) times daily as needed.      citalopram (CELEXA) 40 MG tablet Take 20 mg by mouth daily.      colchicine 0.6 MG tablet Take 0.6 mg by mouth daily.      diltiazem (CARDIZEM CD) 120 MG 24 hr capsule Take 120 mg by mouth daily.      finasteride (PROSCAR) 5 MG tablet Take 5 mg by mouth daily.      furosemide (LASIX) 20 MG tablet Take 20 mg by mouth daily.      gabapentin (NEURONTIN) 100 MG capsule Take 100-200 mg by mouth 3 (three) times daily. Patient takes 1 capsule every morning and 1 capsule at noon and 2 capsules at bedtime     glipiZIDE (GLUCOTROL  XL) 5 MG 24 hr tablet Take 2.5 mg by mouth daily.      HYDROcodone-acetaminophen (VICODIN) 5-500 MG per tablet Take 1 tablet by mouth every 6 (six) hours as needed. pain     levothyroxine (SYNTHROID, LEVOTHROID) 100 MCG tablet Take 200 mcg by mouth daily.      lisinopril (PRINIVIL,ZESTRIL) 10 MG tablet Take 5 mg by mouth daily.      metFORMIN (GLUCOPHAGE) 500 MG tablet Take 500 mg by mouth 2 (two) times daily.      nystatin ointment (MYCOSTATIN) Apply 1 application topically 2 (two) times daily.      omeprazole (PRILOSEC) 20 MG capsule Take 20 mg by mouth at bedtime.      ranitidine (ZANTAC) 150 MG capsule Take 150 mg by mouth 2 (two) times daily.      senna (SENOKOT) 8.6 MG tablet Take 2 tablets by mouth daily. For constipation     simvastatin (ZOCOR) 40 MG tablet Take 20 mg by mouth at bedtime.      terbinafine (LAMISIL) 250 MG tablet Take 250 mg by mouth daily.      warfarin (COUMADIN) 5 MG tablet Take 5-7.5 mg by mouth daily. Patient takes 1 tablet(5mg ) on Mon.,Wed.,fri., then patient takes 1&1/2 tablet(7.5mg ) on Sun.,Tues.,Thur.,Sat.  Consults:  none   SIGNIFICANT DIAGNOSTIC STUDIES:  Dg Chest Port 1 View  01/13/2011  *RADIOLOGY REPORT*  Clinical Data: Cough.  Congestion.  Infection.  Diabetic foot ulcers.  PORTABLE CHEST - 1 VIEW  Comparison: 05/07/2007.  Findings: Cardiomegaly is present.  Subsegmental atelectasis and scarring in the left midlung and at both bases.  There is no airspace disease or effusion.  IMPRESSION: Cardiomegaly without failure.  Original Report Authenticated By: Andreas Newport, M.D.   Dg Foot Complete Right  01/13/2011  *RADIOLOGY REPORT*  Clinical Data: Right third toe swelling and erythema.  RIGHT FOOT COMPLETE - 3+ VIEW  Comparison: 04/19/2010  Findings: Erosive changes of the first interphalangeal joint are again noted with bony fragmentation and destruction present. Overall changes more pronounced compared to the prior x-ray.  No  acute fracture or other areas of bony erosions.  Soft tissues are unremarkable.  IMPRESSION: No evidence by x-ray of osteomyelitis involving the third toe. Progression of advanced erosive changes of the first interphalangeal joint.  Original Report Authenticated By: Reola Calkins, M.D.   Recent Results (from the past 240 hour(s))  CULTURE, BLOOD (ROUTINE X 2)     Status: Normal (Preliminary result)   Collection Time   01/13/11  5:20 PM      Component Value Range Status Comment   Specimen Description BLOOD LEFT ARM   Final    Special Requests BOTTLES DRAWN AEROBIC AND ANAEROBIC 6CC   Final    Culture NO GROWTH 2 DAYS   Final    Report Status PENDING   Incomplete   CULTURE, BLOOD (ROUTINE X 2)     Status: Normal (Preliminary result)   Collection Time   01/13/11  5:45 PM      Component Value Range Status Comment   Specimen Description BLOOD LEFT HAND   Final    Special Requests BOTTLES DRAWN AEROBIC ONLY 5CC   Final    Culture NO GROWTH 2 DAYS   Final    Report Status PENDING   Incomplete   CULTURE, ROUTINE-ABSCESS     Status: Normal (Preliminary result)   Collection Time   01/14/11  1:26 PM      Component Value Range Status Comment   Specimen Description ABSCESS FOOT   Final    Special Requests NONE   Final    Gram Stain     Final    Value: NO WBC SEEN     NO SQUAMOUS EPITHELIAL CELLS SEEN     RARE GRAM POSITIVE COCCI     IN PAIRS   Culture NO GROWTH   Final    Report Status PENDING   Incomplete     BRIEF ADMITTING H & P: This is a 65 year old, Caucasian male, with a past with history of, diabetes, A. fib, hypercholesterolemia, gout, who was in his usual state of health till 2 days ago, when he started noticing that his right third toe was swelling up. He noticed some redness on the toe. And, then over the course of the last 2, days the redness has gotten worse. The swelling has gotten worse. He denies any pain because he has severe neuropathy. He has been able to ambulate. He  had similar infection about a year ago, involving his second right toe and was hospitalized at that time. He, says that he almost lost his toe at that time. Denies any fever. Had some nausea today. Has had the a cough with yellowish expectoration for the last few days. Denies any  shortness of breath or chest pain. He has a doctor's appointment on Tuesday, at the [place].  CBC Status: Abnormal  Collection Time  01/13/11 5:00 PM  Component  Value  Range  Comment  WBC  9.7  4.0 - 10.5 (K/uL)  RBC  3.94 (*)  4.22 - 5.81 (MIL/uL)  Hemoglobin  13.0  13.0 - 17.0 (g/dL)  HCT  16.1  09.6 - 04.5 (%)  MCV  100.5 (*)  78.0 - 100.0 (fL)  MCH  33.0  26.0 - 34.0 (pg)  MCHC  32.8  30.0 - 36.0 (g/dL)  RDW  40.9  81.1 - 91.4 (%)  Platelets  199  150 - 400 (K/uL)  DIFFERENTIAL Status: Abnormal  Collection Time  01/13/11 5:00 PM  Component  Value  Range  Comment  Neutrophils Relative  78 (*)  43 - 77 (%)  Neutro Abs  7.5  1.7 - 7.7 (K/uL)  Lymphocytes Relative  12  12 - 46 (%)  Lymphs Abs  1.2  0.7 - 4.0 (K/uL)  Monocytes Relative  8  3 - 12 (%)  Monocytes Absolute  0.8  0.1 - 1.0 (K/uL)  Eosinophils Relative  2  0 - 5 (%)  Eosinophils Absolute  0.2  0.0 - 0.7 (K/uL)  Basophils Relative  0  0 - 1 (%)  Basophils Absolute  0.0  0.0 - 0.1 (K/uL)  COMPREHENSIVE METABOLIC PANEL Status: Abnormal  Collection Time  01/13/11 5:00 PM  Component  Value  Range  Comment  Sodium  137  135 - 145 (mEq/L)  Potassium  4.1  3.5 - 5.1 (mEq/L)  Chloride  101  96 - 112 (mEq/L)  CO2  27  19 - 32 (mEq/L)  Glucose, Bld  87  70 - 99 (mg/dL)  BUN  20  6 - 23 (mg/dL)  Creatinine, Ser  7.82  0.50 - 1.35 (mg/dL)  Calcium  95.6  8.4 - 10.5 (mg/dL)  Total Protein  7.9  6.0 - 8.3 (g/dL)  Albumin  4.2  3.5 - 5.2 (g/dL)  AST  16  0 - 37 (U/L)  ALT  12  0 - 53 (U/L)  Alkaline Phosphatase  69  39 - 117 (U/L)  Total Bilirubin  0.5  0.3 - 1.2 (mg/dL)  GFR  calc non Af Amer  67 (*)  >90 (mL/min)  GFR calc Af Amer  78 (*)  >90 (mL/min)  PROTIME-INR Status: Abnormal  Collection Time  01/13/11 5:00 PM  Component  Value  Range  Comment  Prothrombin Time  26.3 (*)  11.6 - 15.2 (seconds)  INR  2.37 (*)  0.00 - 1.49  CULTURE, BLOOD (ROUTINE X 2) Status: Normal (Preliminary result)  Collection Time  01/13/11 5:20 PM  Component  Value  Range  Comment  Specimen Description  BLOOD BLOOD LEFT ARM  Special Requests  BOTTLES DRAWN AEROBIC AND ANAEROBIC 6CC  Culture  PENDING  Report Status  PENDING  CULTURE, BLOOD (ROUTINE X 2) Status: Normal (Preliminary result)  Collection Time  01/13/11 5:45 PM  Component  Value  Range  Comment  Specimen Description  BLOOD BLOOD LEFT HAND  Special Requests  BOTTLES DRAWN AEROBIC ONLY 5CC  Culture  PENDING  Report Status  PENDING   Active Hospital Problems  Diagnoses Date Noted   . Cellulitis of toe of right foot: On admission he was started on vancomycin and Zosyn blood cultures and cultures were done blood cultures are negative to date. He was changed to Bactrim  and he tolerated this well he'll continue this for a total of 7 days.  01/13/2011   . Cellulitis of right foot: On admission he was to on broad-spectrum antibiotics. X-ray was done that showed no osteomyelitis. Wound care was consulted for the ulcer on the tip of his third right toe. Trip daily dressing were done. By the next day he was able to walk as his pain got significantly better. He had minimal drainage. This wound has been there he relates a less than a week. He was changed to Bactrim. She will continue for a total of 7 days. Wound cultures are still negative.  01/13/2011   . A-fib: Rate control no changes were made INR therapeutic.  01/13/2011   . Diabetes type 2, controlled: His metformin was stopped initially, in case in contrast had to be given. But his room and pain improved so well. He was started back on his  metformin no changes were made.  01/13/2011   . COPD (chronic obstructive pulmonary disease): Stable  01/13/2011     Resolved Hospital Problems  Diagnoses Date Noted Date Resolved  . Bronchitis, acute: He was treated with conservative management antibiotics were held. His cough improved. He never desaturated.  01/13/2011 01/14/2011     Disposition and Follow-up:  We will followup with the [facility] in 1 week. Here which he did be met. Also see how his cellulitis is doing. The third right toe. And has an ulcer on the tip. He has minimal erythema Discharge Orders    Future Orders Please Complete By Expires   Diet - low sodium heart healthy      Increase activity slowly        Follow-up Information    Follow up with [facility] in 1 week. (For his cellulitis, Date: thursday 2:00 pm)           DISCHARGE EXAM:  General: Alert, awake, oriented x3, in no acute distress.  HEENT: No bruits, no goiter.  Heart: Regular rate and rhythm, without murmurs, rubs, gallops.  Lungs: Crackles left side, bilateral air movement.  Abdomen: Soft, nontender, nondistended, positive bowel sounds.  Neuro: Grossly intact, nonfocal.  Extremities: Right leg is more swollen than the left. With minimal drainage. Not painful to palpation.   Blood pressure 93/57, pulse 60, temperature 98.1 F (36.7 C), temperature source Oral, resp. rate 18, height 6' (1.829 m), weight 82.9 kg (182 lb 12.2 oz), SpO2 93.00%.   Basename 01/14/11 0443 01/13/11 1700  NA 140 137  K 3.9 4.1  CL 103 101  CO2 27 27  GLUCOSE 123* 87  BUN 17 20  CREATININE 1.19 1.12  CALCIUM 9.6 10.5  MG -- --  PHOS -- --    Basename 01/14/11 0443 01/13/11 1700  AST 13 16  ALT 9 12  ALKPHOS 57 69  BILITOT 0.5 0.5  PROT 6.7 7.9  ALBUMIN 3.5 4.2   No results found for this basename: LIPASE:2,AMYLASE:2 in the last 72 hours  Basename 01/14/11 0443 01/13/11 1700  WBC 7.8 9.7  NEUTROABS -- 7.5  HGB 10.6* 13.0  HCT 32.1*  39.6  MCV 100.0 100.5*  PLT 158 199    Signed: Marinda Elk M.D. 01/15/2011, 10:49 AM

## 2011-01-18 LAB — CULTURE, ROUTINE-ABSCESS

## 2011-01-19 LAB — CULTURE, BLOOD (ROUTINE X 2): Culture: NO GROWTH

## 2011-02-13 LAB — HEMOGLOBIN A1C

## 2011-12-27 ENCOUNTER — Emergency Department (HOSPITAL_COMMUNITY)
Admission: EM | Admit: 2011-12-27 | Discharge: 2011-12-28 | Disposition: A | Discharge status: Home / Self Care | Payer: Medicare Other | Attending: Emergency Medicine | Admitting: Emergency Medicine

## 2011-12-27 ENCOUNTER — Encounter (HOSPITAL_COMMUNITY): Payer: Self-pay | Admitting: *Deleted

## 2011-12-27 DIAGNOSIS — F4310 Post-traumatic stress disorder, unspecified: Secondary | ICD-10-CM | POA: Insufficient documentation

## 2011-12-27 DIAGNOSIS — J4489 Other specified chronic obstructive pulmonary disease: Secondary | ICD-10-CM | POA: Insufficient documentation

## 2011-12-27 DIAGNOSIS — Z7982 Long term (current) use of aspirin: Secondary | ICD-10-CM | POA: Insufficient documentation

## 2011-12-27 DIAGNOSIS — L02619 Cutaneous abscess of unspecified foot: Secondary | ICD-10-CM | POA: Insufficient documentation

## 2011-12-27 DIAGNOSIS — Z79899 Other long term (current) drug therapy: Secondary | ICD-10-CM | POA: Insufficient documentation

## 2011-12-27 DIAGNOSIS — Z7901 Long term (current) use of anticoagulants: Secondary | ICD-10-CM | POA: Insufficient documentation

## 2011-12-27 DIAGNOSIS — J449 Chronic obstructive pulmonary disease, unspecified: Secondary | ICD-10-CM | POA: Insufficient documentation

## 2011-12-27 DIAGNOSIS — E119 Type 2 diabetes mellitus without complications: Secondary | ICD-10-CM | POA: Insufficient documentation

## 2011-12-27 DIAGNOSIS — L03116 Cellulitis of left lower limb: Secondary | ICD-10-CM

## 2011-12-27 DIAGNOSIS — L03119 Cellulitis of unspecified part of limb: Secondary | ICD-10-CM | POA: Insufficient documentation

## 2011-12-27 MED ORDER — OXYCODONE-ACETAMINOPHEN 5-325 MG PO TABS: 1.0000 | ORAL_TABLET | Freq: Four times a day (QID) | ORAL | Status: DC | PRN

## 2011-12-27 MED ORDER — CLINDAMYCIN HCL 150 MG PO CAPS
300.0000 mg | ORAL_CAPSULE | Freq: Once | ORAL | Status: AC
  Administered 2011-12-28: 300 mg via ORAL
  Filled 2011-12-27: qty 2

## 2011-12-27 MED ORDER — CLINDAMYCIN HCL 150 MG PO CAPS: 150.0000 mg | ORAL_CAPSULE | Freq: Four times a day (QID) | ORAL | Status: DC

## 2011-12-27 MED ORDER — BACITRACIN-NEOMYCIN-POLYMYXIN 400-5-5000 EX OINT
TOPICAL_OINTMENT | CUTANEOUS | Status: AC
  Filled 2011-12-27: qty 2

## 2011-12-27 NOTE — ED Provider Notes (Signed)
History   This chart was scribed for Sunnie Nielsen, MD by Toya Smothers, ED Scribe. The patient was seen in room APA18/APA18. Patient's care was started at 2248.  CSN: [id number]  Arrival date & time 12/27/11  2248   First MD Initiated Contact with Patient 12/27/11 2258      Chief Complaint  Patient presents with  . Wound Infection   Patient is a 66 y.o. male presenting with wound check. The history is provided by the patient. No language interpreter was used.  Wound Check  He was treated in the ED 5 to 10 days ago. There has been no treatment since the wound repair. There has been clear discharge from the wound. The redness has worsened. The swelling has worsened. The pain has not changed.    Tony [name] is a 66 y.o. male [facility], who presents to [facility] Emergency Department complaining of 1 week of gradual onset, gradually worsening L foot infection with associated clear drainage and warmth. Pain is currently ranked 4/10 and described as an "electrical shock," similar to that of neuropathy pain. Pt reports 4 days of associate increased swelling and redness that is worse in the morning. He reports moderate relief of pain with use of OTC Mineral Ice Ointment, and mild relief with Rx Vicodin and Gabapentin. No fever, chills, cough, congestion, rhinorrhea, chest pain, SOB, or n/v/d. Medical Hx includes Diabetes mellitus, COPD, and Neuropathy. No HTN. Pt denies use of any tobacco products and the consumption of alcohol.   Past Medical History  Diagnosis Date  . Neuropathy   . COPD (chronic obstructive pulmonary disease)   . Diabetes mellitus   . Spinal stenosis   . A-fib   . A-fib   . A-fib   . PTSD (post-traumatic stress disorder)     Past Surgical History  Procedure Date  . Cholecystectomy     Family History  Problem Relation Age of Onset  . Heart disease Mother   . Prostate cancer Brother   . Kidney cancer Brother     History  Substance Use Topics  . Smoking  status: Never Smoker   . Smokeless tobacco: Not on file  . Alcohol Use: No      Review of Systems  Constitutional: Negative for fever and diaphoresis.  Respiratory: Negative for shortness of breath.   Cardiovascular: Negative for chest pain and palpitations.  Skin: Positive for wound.  All other systems reviewed and are negative.    Allergies  Terazosin  Home Medications   Current Outpatient Rx  Name  Route  Sig  Dispense  Refill  . ALBUTEROL SULFATE HFA 108 (90 BASE) MCG/ACT IN AERS   Inhalation   Inhale 2 puffs into the lungs every 6 (six) hours as needed. Shortness of breath          . ALLOPURINOL 300 MG PO TABS   Oral   Take 300 mg by mouth daily.           . AMMONIUM LACTATE 12 % EX LOTN   Topical   Apply 1 application topically as needed. To dry skin          . ASPIRIN EC 81 MG PO TBEC   Oral   Take 81 mg by mouth daily.           Marland Kitchen CALCIUM 600 + D PO   Oral   Take 1 tablet by mouth daily.           Marland Kitchen  CAPSAICIN 0.025 % EX CREA   Topical   Apply 1 application topically 2 (two) times daily as needed.           Marland Kitchen CITALOPRAM HYDROBROMIDE 40 MG PO TABS   Oral   Take 20 mg by mouth daily.           . COLCHICINE 0.6 MG PO TABS   Oral   Take 0.6 mg by mouth daily.           Marland Kitchen DILTIAZEM HCL ER COATED BEADS 120 MG PO CP24   Oral   Take 120 mg by mouth daily.           Marland Kitchen FINASTERIDE 5 MG PO TABS   Oral   Take 5 mg by mouth daily.           . FUROSEMIDE 20 MG PO TABS   Oral   Take 20 mg by mouth daily.           Marland Kitchen GABAPENTIN 100 MG PO CAPS   Oral   Take 100-200 mg by mouth 3 (three) times daily. Patient takes 1 capsule every morning and 1 capsule at noon and 2 capsules at bedtime          . GLIPIZIDE ER 5 MG PO TB24   Oral   Take 2.5 mg by mouth daily.           Marland Kitchen HYDROCODONE-ACETAMINOPHEN 5-500 MG PO TABS   Oral   Take 1 tablet by mouth every 6 (six) hours as needed. pain          . LEVOTHYROXINE SODIUM 100 MCG PO  TABS   Oral   Take 200 mcg by mouth daily.           Marland Kitchen LISINOPRIL 10 MG PO TABS   Oral   Take 5 mg by mouth daily.           Marland Kitchen METFORMIN HCL 500 MG PO TABS   Oral   Take 500 mg by mouth 2 (two) times daily.           . NYSTATIN 100000 UNIT/GM EX OINT   Topical   Apply 1 application topically 2 (two) times daily.           Marland Kitchen OMEPRAZOLE 20 MG PO CPDR   Oral   Take 20 mg by mouth at bedtime.           Marland Kitchen RANITIDINE HCL 150 MG PO CAPS   Oral   Take 150 mg by mouth 2 (two) times daily.           . SENNOSIDES 8.6 MG PO TABS   Oral   Take 2 tablets by mouth daily. For constipation          . SIMVASTATIN 40 MG PO TABS   Oral   Take 20 mg by mouth at bedtime.           . TERBINAFINE HCL 250 MG PO TABS   Oral   Take 250 mg by mouth daily.           . WARFARIN SODIUM 5 MG PO TABS   Oral   Take 5-7.5 mg by mouth daily. Patient takes 1 tablet(5mg ) on Mon.,Wed.,fri., then patient takes 1&1/2 tablet(7.5mg ) on Sun.,Tues.,Thur.,Sat.            BP 109/52  Pulse 56  Temp 97.9 F (36.6 C) (Oral)  Resp 18  Ht 6' (1.829 m)  Wt 230 lb (  104.327 kg)  BMI 31.19 kg/m2  SpO2 97%  Physical Exam  Nursing note and vitals reviewed. Constitutional: He is oriented to person, place, and time. He appears well-developed and well-nourished.  HENT:  Head: Normocephalic and atraumatic.  Eyes: EOM are normal. Pupils are equal, round, and reactive to light.  Neck: Normal range of motion. Neck supple.  Cardiovascular: Normal rate, normal heart sounds and intact distal pulses.   Pulmonary/Chest: Effort normal and breath sounds normal.  Abdominal: Bowel sounds are normal. He exhibits no distension. There is no tenderness.  Musculoskeletal: Normal range of motion. He exhibits no tenderness.       Left foot has runed on dorsum of the foot that is superficial and surrounded by erythema. Increased warmth to touch, mild tenderness to palpation. Distally neurovascularly intact.     Neurological: He is alert and oriented to person, place, and time. He has normal strength. No cranial nerve deficit or sensory deficit.  Skin: Skin is warm and dry.        No streaking erythema. No plantar lesions.  Psychiatric: He has a normal mood and affect.    ED Course  Procedures DIAGNOSTIC STUDIES: Oxygen Saturation is 100% on room air, normal by my interpretation.    COORDINATION OF CARE: 23:19- Evaluated Pt. Pt is awake, alert, and without distress.   PO clinda- Pt driving himself, no narcotics given in ED    MDM  Mild L foot cellulitis, no fever or vomiting or indication admit. No deep wound doubt osteo - is diabetic. VS and nursing notes reviewed. Old records reviewed. [facility] or [facility] hours for recheck. Cellulitis precautions verbalized as understood.      I personally performed the services described in this documentation, which was scribed in my presence. The recorded information has been reviewed and is accurate.     Sunnie Nielsen, MD 12/27/11 [phone]

## 2011-12-27 NOTE — ED Notes (Signed)
Pt with multiple blisters that have popped open with minimal touch per pt x 1 week, left foot red and swollen, denies any pain but pt has neuropathy as well

## 2012-04-02 ENCOUNTER — Inpatient Hospital Stay (HOSPITAL_COMMUNITY)
Admission: EM | Admit: 2012-04-02 | Discharge: 2012-04-04 | DRG: 603 | Disposition: A | Discharge status: Home / Self Care | Payer: Medicare Other | Attending: Internal Medicine | Admitting: Internal Medicine

## 2012-04-02 ENCOUNTER — Emergency Department (HOSPITAL_COMMUNITY): Payer: Medicare Other

## 2012-04-02 ENCOUNTER — Encounter (HOSPITAL_COMMUNITY): Payer: Self-pay | Admitting: *Deleted

## 2012-04-02 DIAGNOSIS — L988 Other specified disorders of the skin and subcutaneous tissue: Secondary | ICD-10-CM | POA: Diagnosis present

## 2012-04-02 DIAGNOSIS — L02419 Cutaneous abscess of limb, unspecified: Principal | ICD-10-CM | POA: Diagnosis present

## 2012-04-02 DIAGNOSIS — Z23 Encounter for immunization: Secondary | ICD-10-CM

## 2012-04-02 DIAGNOSIS — E11622 Type 2 diabetes mellitus with other skin ulcer: Secondary | ICD-10-CM

## 2012-04-02 DIAGNOSIS — E1142 Type 2 diabetes mellitus with diabetic polyneuropathy: Secondary | ICD-10-CM | POA: Diagnosis present

## 2012-04-02 DIAGNOSIS — I4891 Unspecified atrial fibrillation: Secondary | ICD-10-CM

## 2012-04-02 DIAGNOSIS — L97909 Non-pressure chronic ulcer of unspecified part of unspecified lower leg with unspecified severity: Secondary | ICD-10-CM | POA: Diagnosis present

## 2012-04-02 DIAGNOSIS — E1169 Type 2 diabetes mellitus with other specified complication: Secondary | ICD-10-CM | POA: Diagnosis present

## 2012-04-02 DIAGNOSIS — N400 Enlarged prostate without lower urinary tract symptoms: Secondary | ICD-10-CM | POA: Diagnosis present

## 2012-04-02 DIAGNOSIS — Z7901 Long term (current) use of anticoagulants: Secondary | ICD-10-CM

## 2012-04-02 DIAGNOSIS — L03116 Cellulitis of left lower limb: Secondary | ICD-10-CM | POA: Diagnosis present

## 2012-04-02 DIAGNOSIS — E119 Type 2 diabetes mellitus without complications: Secondary | ICD-10-CM | POA: Diagnosis present

## 2012-04-02 DIAGNOSIS — I482 Chronic atrial fibrillation, unspecified: Secondary | ICD-10-CM | POA: Diagnosis present

## 2012-04-02 DIAGNOSIS — M109 Gout, unspecified: Secondary | ICD-10-CM | POA: Diagnosis present

## 2012-04-02 DIAGNOSIS — L0390 Cellulitis, unspecified: Secondary | ICD-10-CM

## 2012-04-02 DIAGNOSIS — J4489 Other specified chronic obstructive pulmonary disease: Secondary | ICD-10-CM | POA: Diagnosis present

## 2012-04-02 DIAGNOSIS — R791 Abnormal coagulation profile: Secondary | ICD-10-CM | POA: Diagnosis present

## 2012-04-02 DIAGNOSIS — E1149 Type 2 diabetes mellitus with other diabetic neurological complication: Secondary | ICD-10-CM | POA: Diagnosis present

## 2012-04-02 DIAGNOSIS — E1140 Type 2 diabetes mellitus with diabetic neuropathy, unspecified: Secondary | ICD-10-CM | POA: Diagnosis present

## 2012-04-02 DIAGNOSIS — E039 Hypothyroidism, unspecified: Secondary | ICD-10-CM | POA: Diagnosis present

## 2012-04-02 DIAGNOSIS — M4800 Spinal stenosis, site unspecified: Secondary | ICD-10-CM | POA: Diagnosis present

## 2012-04-02 DIAGNOSIS — S98139A Complete traumatic amputation of one unspecified lesser toe, initial encounter: Secondary | ICD-10-CM

## 2012-04-02 DIAGNOSIS — J449 Chronic obstructive pulmonary disease, unspecified: Secondary | ICD-10-CM | POA: Diagnosis present

## 2012-04-02 DIAGNOSIS — E11628 Type 2 diabetes mellitus with other skin complications: Secondary | ICD-10-CM | POA: Diagnosis present

## 2012-04-02 DIAGNOSIS — F4310 Post-traumatic stress disorder, unspecified: Secondary | ICD-10-CM | POA: Diagnosis present

## 2012-04-02 DIAGNOSIS — Z79899 Other long term (current) drug therapy: Secondary | ICD-10-CM

## 2012-04-02 DIAGNOSIS — E11621 Type 2 diabetes mellitus with foot ulcer: Secondary | ICD-10-CM

## 2012-04-02 DIAGNOSIS — T45515A Adverse effect of anticoagulants, initial encounter: Secondary | ICD-10-CM | POA: Diagnosis present

## 2012-04-02 LAB — CBC WITH DIFFERENTIAL/PLATELET
Basophils Relative: 0 % (ref 0–1)
Eosinophils Absolute: 0.1 10*3/uL (ref 0.0–0.7)
MCH: 31.7 pg (ref 26.0–34.0)
MCHC: 33.4 g/dL (ref 30.0–36.0)
Neutrophils Relative %: 76 % (ref 43–77)
Platelets: 154 10*3/uL (ref 150–400)
RBC: 4.57 MIL/uL (ref 4.22–5.81)

## 2012-04-02 LAB — URINALYSIS, ROUTINE W REFLEX MICROSCOPIC
Bilirubin Urine: NEGATIVE
Glucose, UA: NEGATIVE mg/dL
Ketones, ur: NEGATIVE mg/dL
Leukocytes, UA: NEGATIVE
pH: 5.5 (ref 5.0–8.0)

## 2012-04-02 LAB — COMPREHENSIVE METABOLIC PANEL
ALT: 17 U/L (ref 0–53)
Albumin: 3.9 g/dL (ref 3.5–5.2)
Alkaline Phosphatase: 88 U/L (ref 39–117)
Potassium: 4.5 mEq/L (ref 3.5–5.1)
Sodium: 137 mEq/L (ref 135–145)
Total Protein: 7 g/dL (ref 6.0–8.3)

## 2012-04-02 LAB — URINE MICROSCOPIC-ADD ON

## 2012-04-02 MED ORDER — VANCOMYCIN HCL IN DEXTROSE 1-5 GM/200ML-% IV SOLN
1000.0000 mg | Freq: Once | INTRAVENOUS | Status: AC
  Administered 2012-04-02: 1000 mg via INTRAVENOUS
  Filled 2012-04-02: qty 200

## 2012-04-02 MED ORDER — SODIUM CHLORIDE 0.9 % IV BOLUS (SEPSIS)
250.0000 mL | Freq: Once | INTRAVENOUS | Status: AC
  Administered 2012-04-02: 21:00:00 via INTRAVENOUS

## 2012-04-02 MED ORDER — SODIUM CHLORIDE 0.9 % IV SOLN
INTRAVENOUS | Status: DC
  Administered 2012-04-02: 22:00:00 via INTRAVENOUS

## 2012-04-02 NOTE — ED Notes (Signed)
Patient states that his medical dog has been vaccinated. States that his rabies vaccination tag is on his collar and that he has the papers for the other shots at home. Patient states that if his dog can not stay with him that he will need to leave because he can't leave him in the truck in the parking lot. Nursing [facility] informed, she states that if we are willing to walk the dog or get security to walk the dog, that he can stay in the ER until the morning then administration can address the issue or the dog may be picked up by the spouse at possible.

## 2012-04-02 NOTE — ED Notes (Signed)
Sores to both feet

## 2012-04-02 NOTE — ED Provider Notes (Signed)
History     CSN: [id number]  Arrival date & time 04/02/12  1932   First MD Initiated Contact with Patient 04/02/12 1944      Chief Complaint  Patient presents with  . Foot Pain    (Consider location/radiation/quality/duration/timing/severity/associated sxs/prior treatment) Patient is a 67 y.o. male presenting with lower extremity pain. The history is provided by the patient.  Foot Pain Pertinent negatives include no chest pain, no abdominal pain, no headaches and no shortness of breath.   long-standing problems with bilateral foot ulcers. But the left ankle now has blistering skin and peeling skin and increased redness. Patient denies any pain however he normally has no feeling in his feet. He is a known diabetic. Normally followed at the [facility]. The sirs on the feet started about a week ago the blistering and peeling of the skin started 3-4 days ago patient also with the complaint of a cough. Patient is on Coumadin.  Past Medical History  Diagnosis Date  . Neuropathy   . COPD (chronic obstructive pulmonary disease)   . Diabetes mellitus   . Spinal stenosis   . A-fib   . A-fib   . A-fib   . PTSD (post-traumatic stress disorder)     Past Surgical History  Procedure Laterality Date  . Cholecystectomy      Family History  Problem Relation Age of Onset  . Heart disease Mother   . Prostate cancer Brother   . Kidney cancer Brother     History  Substance Use Topics  . Smoking status: Never Smoker   . Smokeless tobacco: Not on file  . Alcohol Use: No      Review of Systems  Constitutional: Negative for fever.  HENT: Negative for congestion.   Eyes: Negative for redness and visual disturbance.  Respiratory: Positive for cough. Negative for shortness of breath.   Cardiovascular: Negative for chest pain.  Gastrointestinal: Negative for nausea, vomiting and abdominal pain.  Genitourinary: Negative for dysuria.  Musculoskeletal: Negative for back pain.  Skin:  Positive for wound.  Neurological: Negative for headaches.  Hematological: Does not bruise/bleed easily.    Allergies  Terazosin  Home Medications   Current Outpatient Rx  Name  Route  Sig  Dispense  Refill  . allopurinol (ZYLOPRIM) 300 MG tablet   Oral   Take 300 mg by mouth daily.           Marland Kitchen aspirin EC 81 MG tablet   Oral   Take 81 mg by mouth daily.           . citalopram (CELEXA) 40 MG tablet   Oral   Take 20 mg by mouth daily.           . colchicine 0.6 MG tablet   Oral   Take 0.6 mg by mouth every evening.          . diphenhydramine-acetaminophen (TYLENOL PM) 25-500 MG TABS   Oral   Take 2-3 tablets by mouth at bedtime as needed (for sleep).         . finasteride (PROSCAR) 5 MG tablet   Oral   Take 5 mg by mouth daily.           Marland Kitchen gabapentin (NEURONTIN) 100 MG capsule   Oral   Take 100-200 mg by mouth 3 (three) times daily. Patient takes 1 capsule every afternoonand 2 capsules at bedtime. Patient may take an additional tablet in the middle of the night         .  glipiZIDE (GLUCOTROL XL) 5 MG 24 hr tablet   Oral   Take 2.5 mg by mouth every morning.          Marland Kitchen HYDROcodone-acetaminophen (VICODIN) 5-500 MG per tablet   Oral   Take 1 tablet by mouth every 6 (six) hours as needed. pain          . levothyroxine (SYNTHROID, LEVOTHROID) 100 MCG tablet   Oral   Take 200 mcg by mouth every morning.          Marland Kitchen lisinopril (PRINIVIL,ZESTRIL) 10 MG tablet   Oral   Take 5 mg by mouth every evening.          . metFORMIN (GLUCOPHAGE) 500 MG tablet   Oral   Take 500 mg by mouth 2 (two) times daily.           Marland Kitchen omeprazole (PRILOSEC) 20 MG capsule   Oral   Take 20 mg by mouth at bedtime.          . senna (SENOKOT) 8.6 MG tablet   Oral   Take 3 tablets by mouth at bedtime. For constipation         . warfarin (COUMADIN) 5 MG tablet   Oral   Take 7.5 mg by mouth every evening.          Marland Kitchen albuterol (PROVENTIL HFA;VENTOLIN HFA) 108  (90 BASE) MCG/ACT inhaler   Inhalation   Inhale 2 puffs into the lungs every 6 (six) hours as needed. Shortness of breath          . furosemide (LASIX) 20 MG tablet   Oral   Take 20 mg by mouth daily.             BP 119/46  Pulse 54  Temp(Src) 98.3 F (36.8 C) (Oral)  Resp 20  Ht 6' (1.829 m)  Wt 235 lb (106.595 kg)  BMI 31.86 kg/m2  SpO2 98%  Physical Exam  Nursing note and vitals reviewed. Constitutional: He is oriented to person, place, and time. He appears well-developed and well-nourished. No distress.  HENT:  Head: Normocephalic and atraumatic.  Mouth/Throat: Oropharynx is clear and moist.  Eyes: Conjunctivae and EOM are normal. Pupils are equal, round, and reactive to light.  Neck: Normal range of motion.  Cardiovascular: Normal rate, regular rhythm and normal heart sounds.   No murmur heard. Pulmonary/Chest: Effort normal and breath sounds normal. No respiratory distress.  Abdominal: Soft. Bowel sounds are normal. There is no tenderness.  Musculoskeletal: He exhibits edema. He exhibits no tenderness.  Capillary refill in both feet is brisk. Toes and distal foot are cold but capillary refill is 1 second. Chronic ulcers to the toes. Increased swelling to the left ankle foot and distal leg with erythema and large areas of peeling skin measuring about 3-4 cm. No crepitance. No purulent discharge.  Neurological: He is alert and oriented to person, place, and time. No cranial nerve deficit. He exhibits normal muscle tone. Coordination normal.  Except for decreased sensation in both feet.    ED Course  Procedures (including critical care time)  Labs Reviewed  GLUCOSE, CAPILLARY - Abnormal; Notable for the following:    Glucose-Capillary 135 (*)    All other components within normal limits  COMPREHENSIVE METABOLIC PANEL - Abnormal; Notable for the following:    Glucose, Bld 145 (*)    BUN 25 (*)    GFR calc non Af Amer 56 (*)    GFR calc Af Amer 65 (*)  All  other components within normal limits  PROTIME-INR - Abnormal; Notable for the following:    Prothrombin Time 34.6 (*)    INR 3.71 (*)    All other components within normal limits  CBC WITH DIFFERENTIAL  URINALYSIS, ROUTINE W REFLEX MICROSCOPIC   Dg Chest 2 View  04/02/2012  *RADIOLOGY REPORT*  Clinical Data: Diabetic, COPD  CHEST - 2 VIEW  Comparison: 01/13/2011; 05/07/2007  Findings: Grossly unchanged enlarged cardiac silhouette and mediastinal contours.  Unchanged minimal bilateral perihilar and heterogeneous to facet is favored to represent subsegmental atelectasis.  Lung volumes appear hyperexpanded with flattening of the bilateral hemidiaphragms.  No new focal airspace opacity.  No pleural effusion or pneumothorax.  Unchanged bones.  IMPRESSION: Hyperexpanded lungs and cardiomegaly without acute cardiopulmonary disease.   Original Report Authenticated By: Tacey Ruiz, MD    Dg Tibia/fibula Left  04/02/2012  *RADIOLOGY REPORT*  Clinical Data: History of diabetes, now with open wound on the left lower leg  LEFT TIBIA AND FIBULA - 2 VIEW  Comparison: Left foot radiographs - earlier same day  Findings:  There is rather diffuse soft tissue swelling about the ankle.  No definite subcutaneous emphysema or radiopaque foreign body.  No discrete areas of osteolysis to suggest osteomyelitis.  No fracture or dislocation.  Limited visualization of the adjacent knee demonstrates minimal enthesopathic changes of the origin of the patellar tendon.  IMPRESSION: Diffuse soft tissue swelling about the ankle without definite evidence of osteomyelitis.   Original Report Authenticated By: Tacey Ruiz, MD    Dg Foot Complete Left  04/02/2012  *RADIOLOGY REPORT*  Clinical Data: Open wound on the left side of the lower leg  LEFT FOOT - COMPLETE 3+ VIEW  Comparison: None.  Findings:  There is diffuse soft tissue swelling about the ankle and hind foot without discrete area of osteolysis to suggest osteomyelitis.  No  fracture or dislocation. No radiopaque foreign body.  There are multiple peripheral erosions with sclerotic overhanging edges involving multiple joints of the foot, most conspicuous at the first, second and fifth MCP joints as well as the IP joint of the great toe.  No definite soft tissue tophi.  IMPRESSION: 1.  Diffuse soft tissue swelling about the ankle and hind foot without definite evidence of osteomyelitis. 2.  Suspected gouty involvement of the foot as above.   Original Report Authenticated By: Tacey Ruiz, MD    Dg Foot Complete Right  04/02/2012  *RADIOLOGY REPORT*  Clinical Data: Right foot pain and open wound.  RIGHT FOOT COMPLETE - 3+ VIEW  Comparison: Right foot radiographs performed 01/13/2011  Findings: No new osseous erosions are seen to suggest osteomyelitis.  Marked degenerative erosions at the first interphalangeal joint are grossly unchanged in appearance, with associated fragmentation at the base of the first distal phalanx. More mild erosive change is again noted at the medial aspect of the distal first metacarpal.  Visualized joint spaces are otherwise grossly preserved.  There appears to have been interval resorption or amputation of the distal aspect of the third distal phalanx. The known soft tissue wound is not well characterized.  Mild soft tissue swelling is noted about the forefoot.  A small plantar calcaneal spur is incidentally seen.  IMPRESSION:  1.  No new osseous erosions seen to suggest osteomyelitis. 2.  Marked degenerative erosions at the first interphalangeal joint are grossly unchanged, with associated fragmentation at the base of the first distal phalanx.  Stable mild erosive change at the medial  aspect of the distal first metacarpal. 3.  Interval resorption or amputation of the distal aspect of the third distal phalanx.   Original Report Authenticated By: Tonia Ghent, M.D.    Results for orders placed during the hospital encounter of 04/02/12  GLUCOSE, CAPILLARY       Result Value Range   Glucose-Capillary 135 (*) 70 - 99 mg/dL  CBC WITH DIFFERENTIAL      Result Value Range   WBC 7.7  4.0 - 10.5 K/uL   RBC 4.57  4.22 - 5.81 MIL/uL   Hemoglobin 14.5  13.0 - 17.0 g/dL   HCT 40.9  81.1 - 91.4 %   MCV 95.0  78.0 - 100.0 fL   MCH 31.7  26.0 - 34.0 pg   MCHC 33.4  30.0 - 36.0 g/dL   RDW 78.2  95.6 - 21.3 %   Platelets 154  150 - 400 K/uL   Neutrophils Relative 76  43 - 77 %   Neutro Abs 5.9  1.7 - 7.7 K/uL   Lymphocytes Relative 12  12 - 46 %   Lymphs Abs 0.9  0.7 - 4.0 K/uL   Monocytes Relative 10  3 - 12 %   Monocytes Absolute 0.8  0.1 - 1.0 K/uL   Eosinophils Relative 2  0 - 5 %   Eosinophils Absolute 0.1  0.0 - 0.7 K/uL   Basophils Relative 0  0 - 1 %   Basophils Absolute 0.0  0.0 - 0.1 K/uL  COMPREHENSIVE METABOLIC PANEL      Result Value Range   Sodium 137  135 - 145 mEq/L   Potassium 4.5  3.5 - 5.1 mEq/L   Chloride 100  96 - 112 mEq/L   CO2 25  19 - 32 mEq/L   Glucose, Bld 145 (*) 70 - 99 mg/dL   BUN 25 (*) 6 - 23 mg/dL   Creatinine, Ser 0.86  0.50 - 1.35 mg/dL   Calcium 9.6  8.4 - 57.8 mg/dL   Total Protein 7.0  6.0 - 8.3 g/dL   Albumin 3.9  3.5 - 5.2 g/dL   AST 20  0 - 37 U/L   ALT 17  0 - 53 U/L   Alkaline Phosphatase 88  39 - 117 U/L   Total Bilirubin 0.3  0.3 - 1.2 mg/dL   GFR calc non Af Amer 56 (*) >90 mL/min   GFR calc Af Amer 65 (*) >90 mL/min  PROTIME-INR      Result Value Range   Prothrombin Time 34.6 (*) 11.6 - 15.2 seconds   INR 3.71 (*) 0.00 - 1.49    Date: 04/02/2012  Rate: 56  Rhythm: sinus bradycardia  QRS Axis: normal  Intervals: normal  ST/T Wave abnormalities: nonspecific ST/T changes  Conduction Disutrbances:none  Narrative Interpretation:   Old EKG Reviewed: unchanged No significant change is noted since EKG 01/13/2011. There is some ST segment segment depression in the lateral leads that was present to a degree back in 2012.   1. Cellulitis   2. Diabetic foot ulcers       MDM   Patient  known diabetic. Normally followed at the [facility]. Comes in with sores on both feet and peeling skin of his left ankle and left distal leg. Denies any pain. Workup consistent with a cellulitis and also some chronic diabetic ulcers. Workup shows no evidence of gangrene or new osteomyelitis. Patient given vancomycin IV piggyback in the emergency department. Discussed with the hospitalist team they  will see and admit. Patient's Coumadin level is elevated at 3 but no evidence of any distinct bleeding. Chest x-ray was done because patient felt there was a bit of a cough but that is negative for any pneumonia.        Shelda Jakes, MD 04/02/12 2233

## 2012-04-03 ENCOUNTER — Encounter (HOSPITAL_COMMUNITY): Payer: Self-pay | Admitting: *Deleted

## 2012-04-03 ENCOUNTER — Inpatient Hospital Stay (HOSPITAL_COMMUNITY): Payer: Medicare Other

## 2012-04-03 DIAGNOSIS — R791 Abnormal coagulation profile: Secondary | ICD-10-CM | POA: Diagnosis present

## 2012-04-03 DIAGNOSIS — L02419 Cutaneous abscess of limb, unspecified: Principal | ICD-10-CM

## 2012-04-03 DIAGNOSIS — E1142 Type 2 diabetes mellitus with diabetic polyneuropathy: Secondary | ICD-10-CM

## 2012-04-03 DIAGNOSIS — I4891 Unspecified atrial fibrillation: Secondary | ICD-10-CM

## 2012-04-03 DIAGNOSIS — E11628 Type 2 diabetes mellitus with other skin complications: Secondary | ICD-10-CM | POA: Diagnosis present

## 2012-04-03 DIAGNOSIS — E1149 Type 2 diabetes mellitus with other diabetic neurological complication: Secondary | ICD-10-CM

## 2012-04-03 DIAGNOSIS — J449 Chronic obstructive pulmonary disease, unspecified: Secondary | ICD-10-CM

## 2012-04-03 LAB — BASIC METABOLIC PANEL
BUN: 26 mg/dL — ABNORMAL HIGH (ref 6–23)
Calcium: 9.3 mg/dL (ref 8.4–10.5)
Chloride: 100 mEq/L (ref 96–112)
Creatinine, Ser: 1.12 mg/dL (ref 0.50–1.35)
GFR calc Af Amer: 77 mL/min — ABNORMAL LOW (ref >90)

## 2012-04-03 LAB — CBC
HCT: 40.3 % (ref 39.0–52.0)
MCH: 32.2 pg (ref 26.0–34.0)
MCHC: 34 g/dL (ref 30.0–36.0)
MCV: 94.8 fL (ref 78.0–100.0)
Platelets: 126 10*3/uL — ABNORMAL LOW (ref 150–400)
RDW: 13 % (ref 11.5–15.5)

## 2012-04-03 LAB — GLUCOSE, CAPILLARY
Glucose-Capillary: 209 mg/dL — ABNORMAL HIGH (ref 70–99)
Glucose-Capillary: 159 mg/dL — ABNORMAL HIGH (ref 70–99)

## 2012-04-03 LAB — URIC ACID: Uric Acid, Serum: 8 mg/dL — ABNORMAL HIGH (ref 4.0–7.8)

## 2012-04-03 LAB — TSH: TSH: 0.481 u[IU]/mL (ref 0.350–4.500)

## 2012-04-03 MED ORDER — MUPIROCIN 2 % EX OINT
TOPICAL_OINTMENT | Freq: Two times a day (BID) | CUTANEOUS | Status: DC
  Administered 2012-04-03 – 2012-04-04 (×3): via TOPICAL
  Filled 2012-04-03 (×2): qty 22

## 2012-04-03 MED ORDER — OXYCODONE HCL 5 MG PO TABS
10.0000 mg | ORAL_TABLET | ORAL | Status: DC | PRN
  Administered 2012-04-03 (×3): 10 mg via ORAL
  Filled 2012-04-03 (×3): qty 2

## 2012-04-03 MED ORDER — VANCOMYCIN HCL IN DEXTROSE 1-5 GM/200ML-% IV SOLN
1000.0000 mg | Freq: Three times a day (TID) | INTRAVENOUS | Status: DC
  Administered 2012-04-03 – 2012-04-04 (×4): 1000 mg via INTRAVENOUS
  Filled 2012-04-03 (×10): qty 200

## 2012-04-03 MED ORDER — ACETAMINOPHEN 325 MG PO TABS: 650.0000 mg | ORAL_TABLET | Freq: Four times a day (QID) | ORAL | Status: DC | PRN

## 2012-04-03 MED ORDER — GLIPIZIDE ER 2.5 MG PO TB24
2.5000 mg | ORAL_TABLET | Freq: Every day | ORAL | Status: DC
  Administered 2012-04-03 – 2012-04-04 (×2): 2.5 mg via ORAL
  Filled 2012-04-03 (×4): qty 1

## 2012-04-03 MED ORDER — FINASTERIDE 5 MG PO TABS
5.0000 mg | ORAL_TABLET | Freq: Every day | ORAL | Status: DC
  Administered 2012-04-03 – 2012-04-04 (×2): 5 mg via ORAL
  Filled 2012-04-03 (×4): qty 1

## 2012-04-03 MED ORDER — PANTOPRAZOLE SODIUM 40 MG PO TBEC
40.0000 mg | DELAYED_RELEASE_TABLET | Freq: Every day | ORAL | Status: DC
  Administered 2012-04-03 – 2012-04-04 (×2): 40 mg via ORAL
  Filled 2012-04-03 (×2): qty 1

## 2012-04-03 MED ORDER — SENNA 8.6 MG PO TABS
3.0000 | ORAL_TABLET | Freq: Every day | ORAL | Status: DC
  Administered 2012-04-03: 25.8 mg via ORAL
  Filled 2012-04-03: qty 1
  Filled 2012-04-03 (×3): qty 3

## 2012-04-03 MED ORDER — DIPHENHYDRAMINE HCL 25 MG PO CAPS: 50.0000 mg | ORAL_CAPSULE | Freq: Every evening | ORAL | Status: DC | PRN

## 2012-04-03 MED ORDER — PIPERACILLIN-TAZOBACTAM 3.375 G IVPB
3.3750 g | Freq: Three times a day (TID) | INTRAVENOUS | Status: DC
  Administered 2012-04-03 – 2012-04-04 (×4): 3.375 g via INTRAVENOUS
  Filled 2012-04-03 (×10): qty 50

## 2012-04-03 MED ORDER — INFLUENZA VIRUS VACC SPLIT PF IM SUSP
0.5000 mL | INTRAMUSCULAR | Status: AC
  Administered 2012-04-04: 0.5 mL via INTRAMUSCULAR
  Filled 2012-04-03: qty 0.5

## 2012-04-03 MED ORDER — LORAZEPAM 0.5 MG PO TABS: 0.5000 mg | ORAL_TABLET | ORAL | Status: DC | PRN

## 2012-04-03 MED ORDER — PIPERACILLIN-TAZOBACTAM 3.375 G IVPB
3.3750 g | Freq: Once | INTRAVENOUS | Status: AC
  Administered 2012-04-03: 3.375 g via INTRAVENOUS
  Filled 2012-04-03: qty 50

## 2012-04-03 MED ORDER — PNEUMOCOCCAL VAC POLYVALENT 25 MCG/0.5ML IJ INJ
0.5000 mL | INJECTION | INTRAMUSCULAR | Status: AC
  Administered 2012-04-04: 0.5 mL via INTRAMUSCULAR
  Filled 2012-04-03: qty 0.5

## 2012-04-03 MED ORDER — SORBITOL 70 % SOLN: 30.0000 mL | Freq: Every day | Status: DC | PRN

## 2012-04-03 MED ORDER — ACETAMINOPHEN 650 MG RE SUPP: 650.0000 mg | Freq: Four times a day (QID) | RECTAL | Status: DC | PRN

## 2012-04-03 MED ORDER — ASPIRIN EC 81 MG PO TBEC
81.0000 mg | DELAYED_RELEASE_TABLET | Freq: Every day | ORAL | Status: DC
  Administered 2012-04-03 – 2012-04-04 (×2): 81 mg via ORAL
  Filled 2012-04-03 (×2): qty 1

## 2012-04-03 MED ORDER — WARFARIN - PHARMACIST DOSING INPATIENT: Freq: Every day | Status: DC

## 2012-04-03 MED ORDER — ONDANSETRON HCL 4 MG/2ML IJ SOLN: 4.0000 mg | Freq: Four times a day (QID) | INTRAMUSCULAR | Status: DC | PRN

## 2012-04-03 MED ORDER — PIPERACILLIN-TAZOBACTAM 3.375 G IVPB
INTRAVENOUS | Status: AC
  Filled 2012-04-03: qty 50

## 2012-04-03 MED ORDER — COLCHICINE 0.6 MG PO TABS
0.6000 mg | ORAL_TABLET | Freq: Every evening | ORAL | Status: DC
  Administered 2012-04-03 – 2012-04-04 (×2): 0.6 mg via ORAL
  Filled 2012-04-03 (×2): qty 1

## 2012-04-03 MED ORDER — SODIUM CHLORIDE 0.9 % IV SOLN
INTRAVENOUS | Status: DC
  Administered 2012-04-03: 20:00:00 via INTRAVENOUS

## 2012-04-03 MED ORDER — WARFARIN SODIUM 7.5 MG PO TABS
7.5000 mg | ORAL_TABLET | Freq: Once | ORAL | Status: AC
  Administered 2012-04-03: 7.5 mg via ORAL
  Filled 2012-04-03: qty 1

## 2012-04-03 MED ORDER — LEVOTHYROXINE SODIUM 100 MCG PO TABS
200.0000 ug | ORAL_TABLET | Freq: Every day | ORAL | Status: DC
  Administered 2012-04-03 – 2012-04-04 (×2): 200 ug via ORAL
  Filled 2012-04-03 (×2): qty 2

## 2012-04-03 MED ORDER — CITALOPRAM HYDROBROMIDE 20 MG PO TABS
20.0000 mg | ORAL_TABLET | Freq: Every day | ORAL | Status: DC
  Administered 2012-04-03 – 2012-04-04 (×2): 20 mg via ORAL
  Filled 2012-04-03 (×2): qty 1

## 2012-04-03 MED ORDER — ALLOPURINOL 300 MG PO TABS
300.0000 mg | ORAL_TABLET | Freq: Every day | ORAL | Status: DC
  Administered 2012-04-03 – 2012-04-04 (×2): 300 mg via ORAL
  Filled 2012-04-03 (×2): qty 1

## 2012-04-03 MED ORDER — ALBUTEROL SULFATE HFA 108 (90 BASE) MCG/ACT IN AERS
2.0000 | INHALATION_SPRAY | Freq: Four times a day (QID) | RESPIRATORY_TRACT | Status: DC
  Administered 2012-04-03 – 2012-04-04 (×5): 2 via RESPIRATORY_TRACT
  Filled 2012-04-03: qty 6.7

## 2012-04-03 MED ORDER — ONDANSETRON HCL 4 MG PO TABS: 4.0000 mg | ORAL_TABLET | Freq: Four times a day (QID) | ORAL | Status: DC | PRN

## 2012-04-03 MED ORDER — GABAPENTIN 400 MG PO CAPS
400.0000 mg | ORAL_CAPSULE | Freq: Three times a day (TID) | ORAL | Status: DC
  Administered 2012-04-03 – 2012-04-04 (×5): 400 mg via ORAL
  Filled 2012-04-03 (×5): qty 1

## 2012-04-03 NOTE — Progress Notes (Signed)
[facility] PROGRESS NOTE  Tony [name] NWG:[id number] DOB: 1945/07/14 DOA: 04/02/2012 PCP: [facility], [place]  Assessment/Plan: Principal Problem:   Cellulitis of left lower extremity: Continue IV antibiotics, no signs of osteomyelitis. Awaiting wound care evaluation. Given stabilization of blood sugars, no fever or white count, wants to get wound care as recommendations can change antibiotics over to by mouth Active Problems:   Diabetic neuropathy continue pain medication plus neuropathic medications are   A-fib: Rate controlled and anticoagulated   Gout: Noted signs of gouty arthropathy on x-ray. We'll check a serum uric acid level   BPH (benign prostatic hyperplasia)   Hypothyroidism: Continue Synthroid   Diabetes type 2, controlled: Despite infection, sugars actually doing pretty well. Awaiting A1c. Continue to monitor.   COPD (chronic obstructive pulmonary disease): Currently stable   Diabetic ulcer of lower extremity   Bullosis diabeticorum   Supratherapeutic INR: He is within therapeutic range today   Code Status: Full code  Family Communication: Left msg with wife  Disposition Plan: Likely home with wound care in the next one to 2 days   Consultants:  Wound care  Procedures:  None  Antibiotics:  IV Vanco/Zosyn Day 2 (started 2/13)  HPI/Subjective: Patient doing okay.  Has chronic bilateral lower extremity pain from severe neuropathy. Having some drainage from his leg wound, but does not seem to be causing him any physical discomfort.  Objective: Filed Vitals:   04/02/12 2213 04/03/12 0207 04/03/12 0255 04/03/12 0752  BP: 119/46 116/70 135/47   Pulse: 54 63 59   Temp:   98.5 F (36.9 C)   TempSrc:   Oral   Resp: 20 20 20    Height:      Weight:   114.5 kg (252 lb 6.8 oz)   SpO2: 98% 99% 99% 95%    Intake/Output Summary (Last 24 hours) at 04/03/12 1017 Last data filed at 04/03/12 0849  Gross per 24 hour  Intake 288.33 ml  Output       0 ml  Net 288.33 ml   Filed Weights   04/02/12 1947 04/03/12 0255  Weight: 106.595 kg (235 lb) 114.5 kg (252 lb 6.8 oz)    Exam:   General:  Alert and oriented x3, no acute distress  Cardiovascular: Regular rate and rhythm, S1-S2  Respiratory: Decreased breath sounds throughout  Abdomen: Soft, nontender, nondistended, positive bowel sounds  Extremities right lower extremity shows signs of chronic neuropathy and 1+ edema. Left lower extremity is 2-3+ pitting edema from the knees down. Erythematous throughout the large 4.5 x 6 cm ulceration with some drainage of an and lateral to posterior aspect of the left lower extremity.  Data Reviewed: Basic Metabolic Panel:  Recent Labs Lab 04/02/12 2022 04/03/12 0447  NA 137 137  K 4.5 4.0  CL 100 100  CO2 25 24  GLUCOSE 145* 178*  BUN 25* 26*  CREATININE 1.29 1.12  CALCIUM 9.6 9.3   Liver Function Tests:  Recent Labs Lab 04/02/12 2022  AST 20  ALT 17  ALKPHOS 88  BILITOT 0.3  PROT 7.0  ALBUMIN 3.9   CBC:  Recent Labs Lab 04/02/12 2022 04/03/12 0447  WBC 7.7 7.4  NEUTROABS 5.9  --   HGB 14.5 13.7  HCT 43.4 40.3  MCV 95.0 94.8  PLT 154 126*   CBG:  Recent Labs Lab 04/02/12 1955  GLUCAP 135*     Studies: Dg Chest 2 View  04/02/2012  * IMPRESSION: Hyperexpanded lungs and cardiomegaly without  acute cardiopulmonary disease.   Original Report Authenticated By: Tacey Ruiz, MD    Dg Tibia/fibula Left  04/02/2012   IMPRESSION: Diffuse soft tissue swelling about the ankle without definite evidence of osteomyelitis.   Original Report Authenticated By: Tacey Ruiz, MD    Dg Foot Complete Left  04/02/2012   IMPRESSION: 1.  Diffuse soft tissue swelling about the ankle and hind foot without definite evidence of osteomyelitis. 2.  Suspected gouty involvement of the foot as above.   Original Report Authenticated By: Tacey Ruiz, MD    Dg Foot Complete Right  04/02/2012   IMPRESSION:  1.  No new osseous  erosions seen to suggest osteomyelitis. 2.  Marked degenerative erosions at the first interphalangeal joint are grossly unchanged, with associated fragmentation at the base of the first distal phalanx.  Stable mild erosive change at the medial aspect of the distal first metacarpal. 3.  Interval resorption or amputation of the distal aspect of the third distal phalanx.   Original Report Authenticated By: Tonia Ghent, M.D.     Scheduled Meds: . albuterol  2 puff Inhalation QID  . allopurinol  300 mg Oral Daily  . aspirin EC  81 mg Oral Daily  . citalopram  20 mg Oral Daily  . colchicine  0.6 mg Oral QPM  . finasteride  5 mg Oral Daily  . gabapentin  400 mg Oral TID  . glipiZIDE  2.5 mg Oral Q breakfast  . [START ON 04/04/2012] influenza  inactive virus vaccine  0.5 mL Intramuscular Tomorrow-1000  . levothyroxine  200 mcg Oral QAC breakfast  . pantoprazole  40 mg Oral Daily  . piperacillin-tazobactam (ZOSYN)  IV  3.375 g Intravenous Q8H  . [START ON 04/04/2012] pneumococcal 23 valent vaccine  0.5 mL Intramuscular Tomorrow-1000  . senna  3 tablet Oral QHS  . vancomycin  1,000 mg Intravenous Q8H  . warfarin  7.5 mg Oral ONCE-1800  . Warfarin - Pharmacist Dosing Inpatient   Does not apply q1800   Continuous Infusions: . sodium chloride 50 mL/hr at 04/03/12 0303    Principal Problem:   Cellulitis of left lower extremity Active Problems:   Diabetic neuropathy   A-fib   Gout   BPH (benign prostatic hyperplasia)   Hypothyroidism   Diabetes type 2, controlled   COPD (chronic obstructive pulmonary disease)   Diabetic ulcer of lower extremity   Bullosis diabeticorum   Supratherapeutic INR    Time spent: 20 minutes    Hollice Espy  [facility] Pager [phone]. If 8PM-8AM, please contact night-coverage at [email or web address], password [facility] 04/03/2012, 10:17 AM  LOS: 1 day

## 2012-04-03 NOTE — Care Management Note (Unsigned)
    Page 1 of 1   04/03/2012     4:00:21 PM   CARE MANAGEMENT NOTE 04/03/2012  Patient:  Tony [name], Tony [name]   Account Number:  [id number]  Date Initiated:  04/03/2012  Documentation initiated by:  Rosemary Holms  Subjective/Objective Assessment:   Pt admitted from home where he lives with his wife, 2 adult sons and his medically trained dog. Awaiting wound consult and their directives for care. Pt states his wife will be able to handle wound care therefore he declined HH.     Action/Plan:   [facility]. Form to opt for transfer signed and faxed to [facility]. Since DC is anticipated w/in 24-48 hours, [facility] probl[facility] not [facility].   Anticipated DC Date:  04/04/2012   Anticipated DC Plan:  HOME/SELF CARE      DC Planning Services  CM consult      Choice offered to / List presented to:             Status of service:  In process, will continue to follow Medicare Important Message given?  YES (If response is "NO", the following Medicare IM given date fields will be blank) Date Medicare IM given:  04/03/2012 Date Additional Medicare IM given:    Discharge Disposition:    Per [facility] Regulation:    If discussed at Long Length of Stay Meetings, dates discussed:    Comments:  04/03/12 Rosemary Holms RN BSN CM

## 2012-04-03 NOTE — ED Notes (Signed)
Nursing supervisor at bedside speaking with patient concerning his dog.

## 2012-04-03 NOTE — Progress Notes (Signed)
Inpatient Diabetes Program Recommendations  AACE/ADA: New Consensus Statement on Inpatient Glycemic Control (2013)  Target Ranges:  Prepandial:   less than 140 mg/dL      Peak postprandial:   less than 180 mg/dL (1-2 hours)      Critically ill patients:  140 - 180 mg/dL   Results for REYN, FAIVRE (MRN [id number]) as of 04/03/2012 10:25  Ref. Range 04/02/2012 20:22 04/03/2012 04:47  Glucose Latest Range: 70-99 mg/dL 086 (H) 578 (H)    Inpatient Diabetes Program Recommendations Correction (SSI): Please consider ordering CBGs ACHS with Novolog correction. HgbA1C: Please consider ordering an A1C to determine glycemic control over the last 2-3 months.  Note: Patient has a history of diabetes and takes Metformin 500 mg BID and Glipizide 2.5 mg QAM at home for diabetes management.  Currently patient is ordered Glipizide 2.5 mg QAM for inpatient glycemic control.  Please consider ordering CBGs ACHS with Novolog correction and an A1C.  Last A1C was 6.5% on 04/20/2010. Will continue to follow.  Thanks, Orlando Penner, RN, BSN, CCRN Diabetes Coordinator Inpatient Diabetes Program [phone]

## 2012-04-03 NOTE — Progress Notes (Signed)
ANTIBIOTIC CONSULT NOTE - INITIAL  Pharmacy Consult for Vancomycin and Zosyn Indication: diabetic foot infection, cellulitis, r/o osteomyelitis  Allergies  Allergen Reactions  . Terazosin     Patient is not familiar with this medication as an allergy   Patient Measurements: Height: 6' (182.9 cm) Weight: 252 lb 6.8 oz (114.5 kg) IBW/kg (Calculated) : 77.6  Vital Signs: Temp: 98.5 F (36.9 C) (02/14 0255) Temp src: Oral (02/14 0255) BP: 135/47 mmHg (02/14 0255) Pulse Rate: 59 (02/14 0255) Intake/Output from previous day:   Intake/Output from this shift:    Labs:  Recent Labs  04/02/12 2022 04/03/12 0447  WBC 7.7 7.4  HGB 14.5 13.7  PLT 154 126*  CREATININE 1.29 1.12   Estimated Creatinine Clearance: 84.8 ml/min (by C-G formula based on Cr of 1.12). No results found for this basename: VANCOTROUGH, VANCOPEAK, VANCORANDOM, GENTTROUGH, GENTPEAK, GENTRANDOM, TOBRATROUGH, TOBRAPEAK, TOBRARND, AMIKACINPEAK, AMIKACINTROU, AMIKACIN,  in the last 72 hours   Microbiology: No results found for this or any previous visit (from the past 720 hour(s)).  Medical History: Past Medical History  Diagnosis Date  . Neuropathy   . COPD (chronic obstructive pulmonary disease)   . Diabetes mellitus   . Spinal stenosis   . A-fib   . A-fib   . A-fib   . PTSD (post-traumatic stress disorder)    Medications:  Scheduled:  . albuterol  2 puff Inhalation QID  . allopurinol  300 mg Oral Daily  . aspirin EC  81 mg Oral Daily  . citalopram  20 mg Oral Daily  . colchicine  0.6 mg Oral QPM  . finasteride  5 mg Oral Daily  . gabapentin  400 mg Oral TID  . glipiZIDE  2.5 mg Oral Q breakfast  . [START ON 04/04/2012] influenza  inactive virus vaccine  0.5 mL Intramuscular Tomorrow-1000  . levothyroxine  200 mcg Oral QAC breakfast  . pantoprazole  40 mg Oral Daily  . [COMPLETED] piperacillin-tazobactam (ZOSYN)  IV  3.375 g Intravenous Once  . piperacillin-tazobactam (ZOSYN)  IV  3.375 g  Intravenous Q8H  . [START ON 04/04/2012] pneumococcal 23 valent vaccine  0.5 mL Intramuscular Tomorrow-1000  . senna  3 tablet Oral QHS  . [COMPLETED] sodium chloride  250 mL Intravenous Once  . [COMPLETED] vancomycin  1,000 mg Intravenous Once  . vancomycin  1,000 mg Intravenous Q8H  . warfarin  7.5 mg Oral ONCE-1800  . 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  Pt has recurrent diabetic foot ulcers.  Pt is obese with good renal fxn.  Estimated Creatinine Clearance: 84.8 ml/min (by C-G formula based on Cr of 1.12).  Goal of Therapy:  Vancomycin trough level 10-20 Will target trough level 15-20 until osteo ruled out Eradicate infection.  Plan: Vancomycin 1gm IV q8hrs Check trough tomorrow Zosyn 3.375gm IV q8hrs to be infused over 4 hours Monitor labs, renal fxn, and cultures per protocol  Valrie Hart A 04/03/2012,8:20 AM

## 2012-04-03 NOTE — H&P (Signed)
[facility] History and Physical  EVERITT WENNER RUE:[id number] DOB: [birth year] DOA: 04/02/2012  Referring physician: EDP Rancour PCP: [facility], MD  Specialists: none  Chief Complaint: Leg blisters and ulcers  HPI: Tony [name] is a 67 y.o. [place] Veteran with a medical history significant for Type 2 DM complicated by severe lower extremity peripheral neuropathy, COPD, Gout, and prior left foot toe amputation from osteomyelitis and recurrent diabetic foot ulcers. He comes into the ED with his little chihuahua who he declares is a "certified medical assist dog" complaining of worsening ulcers and redness of his left lower leg. He says they start as dime-quarter sized blisters filled with clear fluid which he pops and then the base gets red and never heals, he is worried that even with his excellent self delivered medical attention to his  wounds that the are getting out of his control. He usually is seen at the [facility] but he got concerned tonight that he may not have time or be able to get to the [facility] for them to be evaluated. He has been on long term oral antibiotics for a similar situation on his right foot and recently ran out of those antibiotics. He also reports severe pain from peripheral neuropathy in his bilateral lower extremities - he has no external sensation in his lower legs or feet but has a deep intense ache in his legs, worse at night it prevents him from sleeping, he takes hydrocodone and gabapentin, but feels his pain is still often unbearable and he has to take more than his usual dose. He also mixes several OTC salves and balms (icy hot, aspercreme etc..) and applies to his legs at night. He has difficulty walking due to the neuropathy and uses a power chair which is in room with him.   He is being admitted for treatment of his diabetic extremity wounds and IV antibiotics for his cellulitis.  Review of Systems: The patient denies anorexia, fever, weight loss,,  vision loss, decreased hearing, hoarseness, chest pain, syncope, dyspnea on exertion, hemoptysis, abdominal pain, melena, hematochezia, severe indigestion/heartburn, hematuria, incontinence, genital sores, muscle weakness,transient blindness, depression, unusual weight change, abnormal bleeding, enlarged lymph nodes, angioedema, and breast masses.    Past Medical History  Diagnosis Date  . Neuropathy   . COPD (chronic obstructive pulmonary disease)   . Diabetes mellitus   . Spinal stenosis   . A-fib   . A-fib   . A-fib   . PTSD (post-traumatic stress disorder)    Past Surgical History  Procedure Laterality Date  . Cholecystectomy     Social History:  reports that he has never smoked. He does not have any smokeless tobacco history on file. He reports that he does not drink alcohol or use illicit drugs. Lives independently at home. Gets his medical care at [facility] [place] in [place].  Allergies  Allergen Reactions  . Terazosin     Patient is not familiar with this medication as an allergy    Family History  Problem Relation Age of Onset  . Heart disease Mother   . Prostate cancer Brother   . Kidney cancer Brother     Prior to Admission medications   Medication Sig Start Date End Date Taking? Authorizing Provider  allopurinol (ZYLOPRIM) 300 MG tablet Take 300 mg by mouth daily.     Yes Historical Provider, MD  aspirin EC 81 MG tablet Take 81 mg by mouth daily.     Yes Historical Provider, MD  citalopram (CELEXA) 40 MG tablet Take 20 mg by mouth daily.     Yes Historical Provider, MD  colchicine 0.6 MG tablet Take 0.6 mg by mouth every evening.    Yes Historical Provider, MD  diphenhydramine-acetaminophen (TYLENOL PM) 25-500 MG TABS Take 2-3 tablets by mouth at bedtime as needed (for sleep).   Yes Historical Provider, MD  finasteride (PROSCAR) 5 MG tablet Take 5 mg by mouth daily.     Yes Historical Provider, MD  gabapentin (NEURONTIN) 100 MG capsule Take 100-200 mg by mouth 3 (three)  times daily. Patient takes 1 capsule every afternoonand 2 capsules at bedtime. Patient may take an additional tablet in the middle of the night   Yes Historical Provider, MD  glipiZIDE (GLUCOTROL XL) 5 MG 24 hr tablet Take 2.5 mg by mouth every morning.    Yes Historical Provider, MD  HYDROcodone-acetaminophen (VICODIN) 5-500 MG per tablet Take 1 tablet by mouth every 6 (six) hours as needed. pain    Yes Historical Provider, MD  levothyroxine (SYNTHROID, LEVOTHROID) 100 MCG tablet Take 200 mcg by mouth every morning.    Yes Historical Provider, MD  lisinopril (PRINIVIL,ZESTRIL) 10 MG tablet Take 5 mg by mouth every evening.    Yes Historical Provider, MD  metFORMIN (GLUCOPHAGE) 500 MG tablet Take 500 mg by mouth 2 (two) times daily.     Yes Historical Provider, MD  omeprazole (PRILOSEC) 20 MG capsule Take 20 mg by mouth at bedtime.    Yes Historical Provider, MD  senna (SENOKOT) 8.6 MG tablet Take 3 tablets by mouth at bedtime. For constipation   Yes Historical Provider, MD  warfarin (COUMADIN) 5 MG tablet Take 7.5 mg by mouth every evening.    Yes Historical Provider, MD  albuterol (PROVENTIL HFA;VENTOLIN HFA) 108 (90 BASE) MCG/ACT inhaler Inhale 2 puffs into the lungs every 6 (six) hours as needed. Shortness of breath     Historical Provider, MD  furosemide (LASIX) 20 MG tablet Take 20 mg by mouth daily.      Historical Provider, MD   Physical Exam: Filed Vitals:   04/02/12 1947 04/02/12 2213  BP: 125/61 119/46  Pulse: 63 54  Temp: 98.3 F (36.8 C)   TempSrc: Oral   Resp: 22 20  Height: 6' (1.829 m)   Weight: 106.595 kg (235 lb)   SpO2: 97% 98%     General:  Pleasant and cooperative, AOX3, NAD  Eyes: normal  ENT: normal  Neck: normal  Cardiovascular: RRR, no mrg- 1+ dp pulses, good cap refill.  Respiratory: normal  Abdomen: soft, NT  Skin: Tan nickel sized bullous lesion on dorso-later lower leg on left, clear fluid, below there are multiple bright red shallow based  circular, subepidermal shear lesions. From mid-calf down to left foot there is generalized redness and induration. Circular lesions on dorsum of several toes. Right foot has several lesions as well but no generalized redness and induration. Other than the skin on his lower legs he has no other rashes or lesions. During exam his very sweet medical dog was walking over his blisters and leaving very small excoriaition scratch marks from his claws.  Musculoskeletal: Normal muscle bulk and tone  Psychiatric: normal affect  Neurologic: non-focal  Labs on Admission:  Basic Metabolic Panel:  Recent Labs Lab 04/02/12 2022  NA 137  K 4.5  CL 100  CO2 25  GLUCOSE 145*  BUN 25*  CREATININE 1.29  CALCIUM 9.6   Liver Function Tests:  Recent Labs Lab  04/02/12 2022  AST 20  ALT 17  ALKPHOS 88  BILITOT 0.3  PROT 7.0  ALBUMIN 3.9   CBC:  Recent Labs Lab 04/02/12 2022  WBC 7.7  NEUTROABS 5.9  HGB 14.5  HCT 43.4  MCV 95.0  PLT 154    Recent Labs Lab 04/02/12 1955  GLUCAP 135*    Radiological Exams on Admission: Dg Chest 2 View  04/02/2012  *RADIOLOGY REPORT*  Clinical Data: Diabetic, COPD  CHEST - 2 VIEW  Comparison: 01/13/2011; 05/07/2007  Findings: Grossly unchanged enlarged cardiac silhouette and mediastinal contours.  Unchanged minimal bilateral perihilar and heterogeneous to facet is favored to represent subsegmental atelectasis.  Lung volumes appear hyperexpanded with flattening of the bilateral hemidiaphragms.  No new focal airspace opacity.  No pleural effusion or pneumothorax.  Unchanged bones.  IMPRESSION: Hyperexpanded lungs and cardiomegaly without acute cardiopulmonary disease.   Original Report Authenticated By: Tacey Ruiz, MD    Dg Tibia/fibula Left  04/02/2012  *RADIOLOGY REPORT*  Clinical Data: History of diabetes, now with open wound on the left lower leg  LEFT TIBIA AND FIBULA - 2 VIEW  Comparison: Left foot radiographs - earlier same day  Findings:  There  is rather diffuse soft tissue swelling about the ankle.  No definite subcutaneous emphysema or radiopaque foreign body.  No discrete areas of osteolysis to suggest osteomyelitis.  No fracture or dislocation.  Limited visualization of the adjacent knee demonstrates minimal enthesopathic changes of the origin of the patellar tendon.  IMPRESSION: Diffuse soft tissue swelling about the ankle without definite evidence of osteomyelitis.   Original Report Authenticated By: Tacey Ruiz, MD    Dg Foot Complete Left  04/02/2012  *RADIOLOGY REPORT*  Clinical Data: Open wound on the left side of the lower leg  LEFT FOOT - COMPLETE 3+ VIEW  Comparison: None.  Findings:  There is diffuse soft tissue swelling about the ankle and hind foot without discrete area of osteolysis to suggest osteomyelitis.  No fracture or dislocation. No radiopaque foreign body.  There are multiple peripheral erosions with sclerotic overhanging edges involving multiple joints of the foot, most conspicuous at the first, second and fifth MCP joints as well as the IP joint of the great toe.  No definite soft tissue tophi.  IMPRESSION: 1.  Diffuse soft tissue swelling about the ankle and hind foot without definite evidence of osteomyelitis. 2.  Suspected gouty involvement of the foot as above.   Original Report Authenticated By: Tacey Ruiz, MD    Dg Foot Complete Right  04/02/2012  *RADIOLOGY REPORT*  Clinical Data: Right foot pain and open wound.  RIGHT FOOT COMPLETE - 3+ VIEW  Comparison: Right foot radiographs performed 01/13/2011  Findings: No new osseous erosions are seen to suggest osteomyelitis.  Marked degenerative erosions at the first interphalangeal joint are grossly unchanged in appearance, with associated fragmentation at the base of the first distal phalanx. More mild erosive change is again noted at the medial aspect of the distal first metacarpal.  Visualized joint spaces are otherwise grossly preserved.  There appears to have been  interval resorption or amputation of the distal aspect of the third distal phalanx. The known soft tissue wound is not well characterized.  Mild soft tissue swelling is noted about the forefoot.  A small plantar calcaneal spur is incidentally seen.  IMPRESSION:  1.  No new osseous erosions seen to suggest osteomyelitis. 2.  Marked degenerative erosions at the first interphalangeal joint are grossly unchanged, with associated  fragmentation at the base of the first distal phalanx.  Stable mild erosive change at the medial aspect of the distal first metacarpal. 3.  Interval resorption or amputation of the distal aspect of the third distal phalanx.   Original Report Authenticated By: Tonia Ghent, M.D.     EKG: Independently reviewed. NSR, no Afib  Assessment/Plan Principal Problem:   Cellulitis of left lower extremity Active Problems:   Diabetic neuropathy   A-fib   Gout   BPH (benign prostatic hyperplasia)   Hypothyroidism   Diabetes type 2, controlled   COPD (chronic obstructive pulmonary disease)   Diabetic ulcer of lower extremity   Bullosis diabeticorum   Supratherapeutic INR   Mr. Serano is being admitted for a left lower extremity cellulitis secondary to ruptured bullae of Bullous Diabeticorum of the lower extremities.   Vanc/Zosyn per pharmacy,  He does not have any systemic signs of infection such as fever or WBC elevation so transition to oral regimen when improving.  Monitor lesions closely and frequently  Bilateral LE arterial ultrasound-probably all microvascular disease  WOC consult, blister management and subepidermal bulla drg recs  XR were abnormal for arthropathy but no chronic osteomyelitis, no deep lesions -will defer MRI for now.  Pain control with oxycodone and gabapentin, acute and chronic pain probably and issue for him given his lower leg and neuropathy issues.   Code Status: presumed full code Family Communication: Patient with full capacity Disposition  Plan: Home with follow up at [facility] when medically stable and ready, likely 2-3 days.   Time spent: 60 minutes  [facility] Pager [phone]  If 7PM-7AM, please contact night-coverage [email or web address] Password [facility] 04/03/2012, 12:29 AM

## 2012-04-03 NOTE — Progress Notes (Signed)
ANTICOAGULATION CONSULT NOTE - Initial Consult  Pharmacy Consult for Coumadin (resume home med) Indication: atrial fibrillation  Allergies  Allergen Reactions  . Terazosin     Patient is not familiar with this medication as an allergy   Patient Measurements: Height: 6' (182.9 cm) Weight: 252 lb 6.8 oz (114.5 kg) IBW/kg (Calculated) : 77.6  Vital Signs: Temp: 98.5 F (36.9 C) (02/14 0255) Temp src: Oral (02/14 0255) BP: 135/47 mmHg (02/14 0255) Pulse Rate: 59 (02/14 0255)  Labs:  Recent Labs  04/02/12 2022 04/03/12 0447  HGB 14.5 13.7  HCT 43.4 40.3  PLT 154 126*  LABPROT 34.6* 25.6*  INR 3.71* 2.47*  CREATININE 1.29 1.12   Estimated Creatinine Clearance: 84.8 ml/min (by C-G formula based on Cr of 1.12).  Medical History: Past Medical History  Diagnosis Date  . Neuropathy   . COPD (chronic obstructive pulmonary disease)   . Diabetes mellitus   . Spinal stenosis   . A-fib   . A-fib   . A-fib   . PTSD (post-traumatic stress disorder)    Medications:  Prescriptions prior to admission  Medication Sig Dispense Refill  . allopurinol (ZYLOPRIM) 300 MG tablet Take 300 mg by mouth daily.        Marland Kitchen aspirin EC 81 MG tablet Take 81 mg by mouth daily.        . citalopram (CELEXA) 40 MG tablet Take 20 mg by mouth daily.        . colchicine 0.6 MG tablet Take 0.6 mg by mouth every evening.       . diphenhydramine-acetaminophen (TYLENOL PM) 25-500 MG TABS Take 2-3 tablets by mouth at bedtime as needed (for sleep).      . finasteride (PROSCAR) 5 MG tablet Take 5 mg by mouth daily.        Marland Kitchen gabapentin (NEURONTIN) 100 MG capsule Take 100-200 mg by mouth 3 (three) times daily. Patient takes 1 capsule every afternoonand 2 capsules at bedtime. Patient may take an additional tablet in the middle of the night      . glipiZIDE (GLUCOTROL XL) 5 MG 24 hr tablet Take 2.5 mg by mouth every morning.       Marland Kitchen HYDROcodone-acetaminophen (VICODIN) 5-500 MG per tablet Take 1 tablet by mouth  every 6 (six) hours as needed. pain       . levothyroxine (SYNTHROID, LEVOTHROID) 100 MCG tablet Take 200 mcg by mouth every morning.       Marland Kitchen lisinopril (PRINIVIL,ZESTRIL) 10 MG tablet Take 5 mg by mouth every evening.       . metFORMIN (GLUCOPHAGE) 500 MG tablet Take 500 mg by mouth 2 (two) times daily.        Marland Kitchen omeprazole (PRILOSEC) 20 MG capsule Take 20 mg by mouth at bedtime.       . senna (SENOKOT) 8.6 MG tablet Take 3 tablets by mouth at bedtime. For constipation      . warfarin (COUMADIN) 5 MG tablet Take 7.5 mg by mouth every evening.       Marland Kitchen albuterol (PROVENTIL HFA;VENTOLIN HFA) 108 (90 BASE) MCG/ACT inhaler Inhale 2 puffs into the lungs every 6 (six) hours as needed. Shortness of breath       . furosemide (LASIX) 20 MG tablet Take 20 mg by mouth daily.          Assessment: 67yo male, [place] War Veteran, on chronic Coumadin for afib.  INR was supratherapeutic on admission but has trended down to therapeutic range today.  Home dose reportedly 7.5mg  daily. Goal of Therapy:  INR 2-3 Monitor platelets by anticoagulation protocol: Yes   Plan: Coumadin 7.5mg  today x 1 INR daily  Valrie Hart A 04/03/2012,8:12 AM

## 2012-04-03 NOTE — ED Notes (Signed)
After night shift AC consulted with Administrator on call, patient will be admitted to the floor with his dog going with him.

## 2012-04-03 NOTE — Consult Note (Signed)
ANTIBIOTIC CONSULT NOTE-Preliminary  Pharmacy Consult for Vancomycin and Zosyn Indication: Diabetic cellulitis   Allergies  Allergen Reactions  . Terazosin     Patient is not familiar with this medication as an allergy    Patient Measurements: Height: 6' (182.9 cm) Weight: 252 lb 6.8 oz (114.5 kg) IBW/kg (Calculated) : 77.6   Vital Signs: Temp: 98.5 F (36.9 C) (02/14 0255) Temp src: Oral (02/14 0255) BP: 135/47 mmHg (02/14 0255) Pulse Rate: 59 (02/14 0255)  Labs:  Recent Labs  04/02/12 2022  WBC 7.7  HGB 14.5  PLT 154  CREATININE 1.29    Estimated Creatinine Clearance: 73.6 ml/min (by C-G formula based on Cr of 1.29).  No results found for this basename: VANCOTROUGH, VANCOPEAK, VANCORANDOM, GENTTROUGH, GENTPEAK, GENTRANDOM, TOBRATROUGH, TOBRAPEAK, TOBRARND, AMIKACINPEAK, AMIKACINTROU, AMIKACIN,  in the last 72 hours   Microbiology: No results found for this or any previous visit (from the past 720 hour(s)).  Medical History: Past Medical History  Diagnosis Date  . Neuropathy   . COPD (chronic obstructive pulmonary disease)   . Diabetes mellitus   . Spinal stenosis   . A-fib   . A-fib   . A-fib   . PTSD (post-traumatic stress disorder)     Medications:  Vancomycin 1 Gm IV in the ED   Assessment: Patient is a 67 yo male with medical history sig for type 2 DM, severe lower extremity peripheral neuropathy, and recurrent diabetic foot ulcers. 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 He is admitted for IV antibiotic treatment of lower extremity wounds and cellulitis.  Goal of Therapy:  Vancomycin troughs 10-15 mcg/ml Eradication of infection   Plan:  Preliminary review of pertinent patient information completed.  Protocol will be initiated with a one-time dose of Zosyn 3.375 Gm IV.  Jeani Hawking clinical pharmacist will complete review during morning rounds to assess patient and finalize treatment regimen.  Arelia Sneddon, [facility] 04/03/2012,3:14  AM

## 2012-04-03 NOTE — Consult Note (Signed)
WOC consult Note Reason for Consult: Consult performed via remote camera with bedside nurse assistance.  Pt had blisters to left leg which ruptured several days ago and evolved into full thickness wounds.  Wound type: Left leg with full thickness wounds; Lower leg 2X2.5X.1cm 100% red and moist Middle leg 1X6X.1cm 100% red and moist Middle calf 11X6X.2cm 80% red, 20% yellow All sites with large amt yellow drainage, surrounded by purple reddish skin. Several tips of toes with dry scabs. Pt states these drained "lots of pus'  Prior to admission.  He has a history of gout.  Dressing procedure/placement/frequency: Pt on IV vancomycin to treat cellulitis and po gout meds.  Bactroban to tips of toe wounds to promote moist healing.  Wound gel to promote moist healing to left leg. Wound should be lightly scrubbed with each dressing change to assist with removal of nonviable tissue. Explained plan of care to patient, he denies further questions. Will not plan to follow further unless re-consulted.  [address] RN, MSN, [employer]  [phone]

## 2012-04-03 NOTE — ED Notes (Signed)
Nursing AC spoke with CN Buzzy Han. Stated to her that the shot record had to be produced in order for the dog to stay in the hospital. Also that the patient would not be able to go outside in the ice to walk the dog. CN at bedside speaking with patient at this time.

## 2012-04-04 DIAGNOSIS — I4891 Unspecified atrial fibrillation: Secondary | ICD-10-CM

## 2012-04-04 DIAGNOSIS — E119 Type 2 diabetes mellitus without complications: Secondary | ICD-10-CM

## 2012-04-04 LAB — GLUCOSE, CAPILLARY: Glucose-Capillary: 116 mg/dL — ABNORMAL HIGH (ref 70–99)

## 2012-04-04 LAB — VANCOMYCIN, TROUGH: Vancomycin Tr: 18.1 ug/mL (ref 10.0–20.0)

## 2012-04-04 LAB — PROTIME-INR: Prothrombin Time: 21.2 seconds — ABNORMAL HIGH (ref 11.6–15.2)

## 2012-04-04 LAB — CBC
Hemoglobin: 12.6 g/dL — ABNORMAL LOW (ref 13.0–17.0)
Platelets: 119 10*3/uL — ABNORMAL LOW (ref 150–400)
RBC: 3.9 MIL/uL — ABNORMAL LOW (ref 4.22–5.81)
WBC: 5.4 10*3/uL (ref 4.0–10.5)

## 2012-04-04 MED ORDER — OXYCODONE HCL 10 MG PO TABS: 10.0000 mg | ORAL_TABLET | Freq: Four times a day (QID) | ORAL | Status: DC | PRN

## 2012-04-04 MED ORDER — WARFARIN SODIUM 7.5 MG PO TABS: 7.5000 mg | ORAL_TABLET | Freq: Once | ORAL | Status: DC

## 2012-04-04 MED ORDER — LEVOFLOXACIN 500 MG PO TABS: 500.0000 mg | ORAL_TABLET | Freq: Every day | ORAL | Status: AC

## 2012-04-04 MED ORDER — MUPIROCIN 2 % EX OINT: TOPICAL_OINTMENT | Freq: Two times a day (BID) | CUTANEOUS | Status: DC

## 2012-04-04 MED ORDER — GABAPENTIN 400 MG PO CAPS: 400.0000 mg | ORAL_CAPSULE | Freq: Three times a day (TID) | ORAL | Status: DC

## 2012-04-04 NOTE — Plan of Care (Signed)
Problem: Phase I Progression Outcomes Goal: OOB as tolerated unless otherwise ordered Outcome: Progressing Pt up to wheelchair and to bathroom with contact guard assist. Pt is weak but tolerates ambulation in room well.  Goal: Wound assessment- dressing change as appropriate Outcome: Completed/Met Date Met:  04/04/12 Pt evaluated by WOC RN, treatment plan in place.

## 2012-04-04 NOTE — Discharge Summary (Signed)
Physician Discharge Summary  Tony [name] ZOX:[id number] DOB: 31-Oct-1945 DOA: 04/02/2012  PCP: [facility], MD  Admit date: 04/02/2012 Discharge date: 04/04/2012  Time spent: 25 minutes  Recommendations for Outpatient Follow-up:  1. Patient will follow up with his physicians at the [facility] in the next few weeks. 2. He will continue on wound care as followsBactroban to tips of toe wounds to promote moist healing. Wound gel to promote moist healing to left leg. Wound should be lightly scrubbed with each dressing change to assist with removal of nonviable tissue.   Discharge Diagnoses:  Principal Problem:   Cellulitis of left lower extremity Active Problems:   Diabetic neuropathy   A-fib   Gout   BPH (benign prostatic hyperplasia)   Hypothyroidism   Diabetes type 2, controlled   COPD (chronic obstructive pulmonary disease)   Diabetic ulcer of lower extremity   Bullosis diabeticorum   Supratherapeutic INR   Discharge Condition: Improved, being discharged home  Diet recommendation: Carb modified heart healthy  Filed Weights   04/02/12 1947 04/03/12 0255  Weight: 106.595 kg (235 lb) 114.5 kg (252 lb 6.8 oz)    History of present illness:  Tony [name] is a 67 y.o. [place] Veteran with a medical history significant for Type 2 DM complicated by severe lower extremity peripheral neuropathy, COPD, Gout, and prior left foot toe amputation from osteomyelitis and recurrent diabetic foot ulcers. He comes into the ED with his little chihuahua who he declares is a "certified medical assist dog" complaining of worsening ulcers and redness of his left lower leg. He says they start as dime-quarter sized blisters filled with clear fluid which he pops and then the base gets red and never heals, he is worried that even with his excellent self delivered medical attention to his wounds that the are getting out of his control. He usually is seen at the [place] but he got concerned tonight that he  may not have time or be able to get to the [place] for them to be evaluated. He has been on long term oral antibiotics for a similar situation on his right foot and recently ran out of those antibiotics. He also reports severe pain from peripheral neuropathy in his bilateral lower extremities - he has no external sensation in his lower legs or feet but has a deep intense ache in his legs, worse at night it prevents him from sleeping, he takes hydrocodone and gabapentin, but feels his pain is still often unbearable and he has to take more than his usual dose. He also mixes several OTC salves and balms (icy hot, aspercreme etc..) and applies to his legs at night. He has difficulty walking due to the neuropathy and uses a power chair which is in room with him.  He is being admitted for treatment of his diabetic extremity wounds and IV antibiotics for his cellulitis.   Hospital Course:    Cellulitis of left lower extremity: Pacer on IV vancomycin. X-rays note no signs of osteomyelitis. Arterial Dopplers note slightly decreasing waveform activity the patient does likely have a little peripheral vascular disease, but has enough good flow for treatment. White blood cell count remained normal and no signs of any sepsis. No further fevers once antibiotics started. Wound care evaluated patient and recommended gel for his large wound on his leg plus Bactroban for his toes. Patient transitioned over to by mouth Levaquin for the next 5 days for total 7 days of therapy. He'll continue wound care.  He declined home health as is his wife can help him with wound dressings.  Active Problems:   Diabetic neuropathy: Patient continues to have some difficulty with this. We titrated up his Neurontin. He says he has no relief from Vicodin. He had some and benefits on OxyIR 10 mg. Given prescription for 30 pills of OxyIR. Advised to discontinue Vicodin. Prescription given for increased dose of Neurontin. Patient will followup with primary  care physician   A-fib: Rate control during hospitalization. INR therapeutic. Continue Coumadin at home   Gout: By x-rays, patient noted to have some signs of gouty arthropathy. Uric acid level checked and have been mildly elevated at 8.2. Already on colchicine and allopurinol at home. Pain was more secondary to neuropathy rather than gout.    BPH (benign prostatic hyperplasia): Stable   Hypothyroidism: Stable continue Synthroid   Diabetes type 2, controlled: Relatively stable. Should improve his wounds are healing   COPD (chronic obstructive pulmonary disease): Stable   Diabetic ulcer of lower extremity   Bullosis diabeticorum   Supratherapeutic INR minimally elevated at 3.1 on day of admission. By day 2, therapeutic range  Procedures:  None  Consultations:  None  Discharge Exam: Filed Vitals:   04/03/12 1506 04/03/12 2137 04/03/12 2216 04/04/12 1501  BP: 104/65 89/60  120/71  Pulse: 50   48  Temp: 99.8 F (37.7 C)   98.2 F (36.8 C)  TempSrc: Oral     Resp: 20 20  20   Height:      Weight:      SpO2: 96% 97% 94% 98%    General: Alert and oriented x3, no acute distress Cardiovascular: Regular rate and rhythm, S1-S2 Respiratory: Clear auscultation bilaterally Abdomen: Soft, nontender, nondistended, positive bowel sounds Extremities: 1+ pitting edema from the knees down bilaterally. Evidence of chronic neuropathy. Left lower extremity is wrapped from the midcalf down and gauze dressing.  Discharge Instructions  Discharge Orders   Future Orders Complete By Expires     Change dressing (specify)  As directed     Comments:      Dressing change: daily with sterile gauze    Diet Carb Modified  As directed     Discharge wound care:  As directed     Comments:      Bactroban to tips of toe wounds to promote moist healing.  Wound gel to promote moist healing to left leg. Wound should be lightly scrubbed with each dressing change to assist with removal of nonviable tissue.     Increase activity slowly  As directed         Medication List    STOP taking these medications       furosemide 20 MG tablet  Commonly known as:  LASIX     HYDROcodone-acetaminophen 5-500 MG per tablet  Commonly known as:  VICODIN      TAKE these medications       albuterol 108 (90 BASE) MCG/ACT inhaler  Commonly known as:  PROVENTIL HFA;VENTOLIN HFA  Inhale 2 puffs into the lungs every 6 (six) hours as needed. Shortness of breath     allopurinol 300 MG tablet  Commonly known as:  ZYLOPRIM  Take 300 mg by mouth daily.     aspirin EC 81 MG tablet  Take 81 mg by mouth daily.     citalopram 40 MG tablet  Commonly known as:  CELEXA  Take 20 mg by mouth daily.     colchicine 0.6 MG tablet  Take 0.6  mg by mouth every evening.     diphenhydramine-acetaminophen 25-500 MG Tabs  Commonly known as:  TYLENOL PM  Take 2-3 tablets by mouth at bedtime as needed (for sleep).     finasteride 5 MG tablet  Commonly known as:  PROSCAR  Take 5 mg by mouth daily.     gabapentin 400 MG capsule  Commonly known as:  NEURONTIN  Take 1 capsule (400 mg total) by mouth 3 (three) times daily.     glipiZIDE 5 MG 24 hr tablet  Commonly known as:  GLUCOTROL XL  Take 2.5 mg by mouth every morning.     levofloxacin 500 MG tablet  Commonly known as:  LEVAQUIN  Take 1 tablet (500 mg total) by mouth daily.     levothyroxine 100 MCG tablet  Commonly known as:  SYNTHROID, LEVOTHROID  Take 200 mcg by mouth every morning.     lisinopril 10 MG tablet  Commonly known as:  PRINIVIL,ZESTRIL  Take 5 mg by mouth every evening.     metFORMIN 500 MG tablet  Commonly known as:  GLUCOPHAGE  Take 500 mg by mouth 2 (two) times daily.     mupirocin ointment 2 %  Commonly known as:  BACTROBAN  Apply topically 2 (two) times daily.     omeprazole 20 MG capsule  Commonly known as:  PRILOSEC  Take 20 mg by mouth at bedtime.     Oxycodone HCl 10 MG Tabs  Take 1 tablet (10 mg total) by mouth every 6  (six) hours as needed for pain.     senna 8.6 MG tablet  Commonly known as:  SENOKOT  Take 3 tablets by mouth at bedtime. For constipation     warfarin 5 MG tablet  Commonly known as:  COUMADIN  Take 7.5 mg by mouth every evening.           Follow-up Information   Follow up with [facility], MD In 1 month.       The results of significant diagnostics from this hospitalization (including imaging, microbiology, ancillary and laboratory) are listed below for reference.    Significant Diagnostic Studies: Dg Chest 2 View  04/02/2012   IMPRESSION: Hyperexpanded lungs and cardiomegaly without acute cardiopulmonary disease.   Original Report Authenticated By: Tacey Ruiz, MD    Dg Tibia/fibula Left  04/02/2012    IMPRESSION: Diffuse soft tissue swelling about the ankle without definite evidence of osteomyelitis.   Original Report Authenticated By: Tacey Ruiz, MD    US Arterial Seg Multiple  04/03/2012  .  IMPRESSION: ABIs are within normal limits bilaterally.  Waveforms are mildly dampened suggesting the possibility of mild to moderate arterial disease with noncompressible vessels. If further evaluation is felt warranted, CTA of the lower extremities may be beneficial.   Original Report Authenticated By: Charlett Nose, M.D.    Dg Foot Complete Left  04/02/2012   IMPRESSION: 1.  Diffuse soft tissue swelling about the ankle and hind foot without definite evidence of osteomyelitis. 2.  Suspected gouty involvement of the foot as above.   Original Report Authenticated By: Tacey Ruiz, MD    Dg Foot Complete Right  04/02/2012    IMPRESSION:  1.  No new osseous erosions seen to suggest osteomyelitis. 2.  Marked degenerative erosions at the first interphalangeal joint are grossly unchanged, with associated fragmentation at the base of the first distal phalanx.  Stable mild erosive change at the medial aspect of the distal first  metacarpal. 3.  Interval resorption or amputation of  the distal aspect of the third distal phalanx.   Original Report Authenticated By: Tonia Ghent, M.D.     Microbiology: No results found for this or any previous visit (from the past 240 hour(s)).   Labs: Basic Metabolic Panel:  Recent Labs Lab 04/02/12 2022 04/03/12 0447  NA 137 137  K 4.5 4.0  CL 100 100  CO2 25 24  GLUCOSE 145* 178*  BUN 25* 26*  CREATININE 1.29 1.12  CALCIUM 9.6 9.3   Liver Function Tests:  Recent Labs Lab 04/02/12 2022  AST 20  ALT 17  ALKPHOS 88  BILITOT 0.3  PROT 7.0  ALBUMIN 3.9   CBC:  Recent Labs Lab 04/02/12 2022 04/03/12 0447 04/04/12 0533  WBC 7.7 7.4 5.4  NEUTROABS 5.9  --   --   HGB 14.5 13.7 12.6*  HCT 43.4 40.3 37.4*  MCV 95.0 94.8 95.9  PLT 154 126* 119*   CBG:  Recent Labs Lab 04/02/12 1955 04/03/12 0842 04/03/12 2135 04/04/12 0747 04/04/12 1125  GLUCAP 135* 209* 159* 116* 182*       Signed:  Nefi Musich K  [facility] 04/04/2012, 3:26 PM

## 2012-04-04 NOTE — Progress Notes (Signed)
ANTICOAGULATION CONSULT NOTE  Pharmacy Consult for Coumadin (resume home med) Indication: atrial fibrillation  Allergies  Allergen Reactions  . Terazosin     Patient is not familiar with this medication as an allergy   Patient Measurements: Height: 6' (182.9 cm) Weight: 252 lb 6.8 oz (114.5 kg) IBW/kg (Calculated) : 77.6  Vital Signs:    Labs:  Recent Labs  04/02/12 2022 04/03/12 0447 04/04/12 0533 04/04/12 1139  HGB 14.5 13.7 12.6*  --   HCT 43.4 40.3 37.4*  --   PLT 154 126* 119*  --   LABPROT 34.6* 25.6*  --  21.2*  INR 3.71* 2.47*  --  1.92*  CREATININE 1.29 1.12  --   --    Estimated Creatinine Clearance: 84.8 ml/min (by C-G formula based on Cr of 1.12).  Medical History: Past Medical History  Diagnosis Date  . Neuropathy   . COPD (chronic obstructive pulmonary disease)   . Diabetes mellitus   . Spinal stenosis   . A-fib   . A-fib   . A-fib   . PTSD (post-traumatic stress disorder)    Medications:  Prescriptions prior to admission  Medication Sig Dispense Refill  . allopurinol (ZYLOPRIM) 300 MG tablet Take 300 mg by mouth daily.        Marland Kitchen aspirin EC 81 MG tablet Take 81 mg by mouth daily.        . citalopram (CELEXA) 40 MG tablet Take 20 mg by mouth daily.        . colchicine 0.6 MG tablet Take 0.6 mg by mouth every evening.       . diphenhydramine-acetaminophen (TYLENOL PM) 25-500 MG TABS Take 2-3 tablets by mouth at bedtime as needed (for sleep).      . finasteride (PROSCAR) 5 MG tablet Take 5 mg by mouth daily.        Marland Kitchen gabapentin (NEURONTIN) 100 MG capsule Take 100-200 mg by mouth 3 (three) times daily. Patient takes 1 capsule every afternoonand 2 capsules at bedtime. Patient may take an additional tablet in the middle of the night      . glipiZIDE (GLUCOTROL XL) 5 MG 24 hr tablet Take 2.5 mg by mouth every morning.       Marland Kitchen HYDROcodone-acetaminophen (VICODIN) 5-500 MG per tablet Take 1 tablet by mouth every 6 (six) hours as needed. pain       .  levothyroxine (SYNTHROID, LEVOTHROID) 100 MCG tablet Take 200 mcg by mouth every morning.       Marland Kitchen lisinopril (PRINIVIL,ZESTRIL) 10 MG tablet Take 5 mg by mouth every evening.       . metFORMIN (GLUCOPHAGE) 500 MG tablet Take 500 mg by mouth 2 (two) times daily.        Marland Kitchen omeprazole (PRILOSEC) 20 MG capsule Take 20 mg by mouth at bedtime.       . senna (SENOKOT) 8.6 MG tablet Take 3 tablets by mouth at bedtime. For constipation      . warfarin (COUMADIN) 5 MG tablet Take 7.5 mg by mouth every evening.       Marland Kitchen albuterol (PROVENTIL HFA;VENTOLIN HFA) 108 (90 BASE) MCG/ACT inhaler Inhale 2 puffs into the lungs every 6 (six) hours as needed. Shortness of breath       . furosemide (LASIX) 20 MG tablet Take 20 mg by mouth daily.          Assessment: 67yo male, [place] [employer] Veteran, on chronic Coumadin for afib. Home dose reportedly 7.5mg  daily.  INR below  goal today. Goal of Therapy:  INR 2-3 Monitor platelets by anticoagulation protocol: Yes   Plan: Repeat Coumadin 7.5mg  today x 1 INR daily  Mady Gemma 04/04/2012,12:19 PM

## 2012-04-04 NOTE — Plan of Care (Signed)
Problem: Phase II Progression Outcomes Goal: Wound without signs/symptoms of infection, decreasing edema Outcome: Completed/Met Date Met:  04/04/12 Wound consult completed, recommendations received and followed.

## 2012-04-04 NOTE — Progress Notes (Signed)
Pt verbalizes understanding of d/c instructions, wound treatment, prescriptions, and follow up care. Pt has no questions at this time. IV d/c. Pt d/c via his motorized wheelchair by NT. Sheryn Bison

## 2012-04-04 NOTE — Progress Notes (Addendum)
ANTIBIOTIC CONSULT NOTE  Pharmacy Consult for Vancomycin and Zosyn Indication: diabetic foot infection, cellulitis, r/o osteomyelitis  Allergies  Allergen Reactions  . Terazosin     Patient is not familiar with this medication as an allergy  Patient Measurements: Height: 6' (182.9 cm) Weight: 252 lb 6.8 oz (114.5 kg) IBW/kg (Calculated) : 77.6  Vital Signs:   Intake/Output from previous day: 02/14 0701 - 02/15 0700 In: 408.3 [P.O.:120; I.V.:288.3] Out: -  Intake/Output from this shift: Total I/O In: 1196.7 [I.V.:1196.7] Out: -  Labs:  Recent Labs  04/02/12 2022 04/03/12 0447 04/04/12 0533  WBC 7.7 7.4 5.4  HGB 14.5 13.7 12.6*  PLT 154 126* 119*  CREATININE 1.29 1.12  --    Estimated Creatinine Clearance: 84.8 ml/min (by C-G formula based on Cr of 1.12).  Recent Labs  04/04/12 0822  VANCOTROUGH 18.1    Microbiology: No results found for this or any previous visit (from the past 720 hour(s)). Medical History: Past Medical History  Diagnosis Date  . Neuropathy   . COPD (chronic obstructive pulmonary disease)   . Diabetes mellitus   . Spinal stenosis   . A-fib   . A-fib   . A-fib   . PTSD (post-traumatic stress disorder)   Medications:  Scheduled:  . albuterol  2 puff Inhalation QID  . allopurinol  300 mg Oral Daily  . aspirin EC  81 mg Oral Daily  . citalopram  20 mg Oral Daily  . colchicine  0.6 mg Oral QPM  . finasteride  5 mg Oral Daily  . gabapentin  400 mg Oral TID  . glipiZIDE  2.5 mg Oral Q breakfast  . influenza  inactive virus vaccine  0.5 mL Intramuscular Tomorrow-1000  . levothyroxine  200 mcg Oral QAC breakfast  . mupirocin ointment   Topical BID  . pantoprazole  40 mg Oral Daily  . piperacillin-tazobactam (ZOSYN)  IV  3.375 g Intravenous Q8H  . pneumococcal 23 valent vaccine  0.5 mL Intramuscular Tomorrow-1000  . senna  3 tablet Oral QHS  . vancomycin  1,000 mg Intravenous Q8H  . [COMPLETED] warfarin  7.5 mg Oral ONCE-1800  .  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  Pt has recurrent diabetic foot ulcers.  Pt is obese with good renal fxn.  Estimated Creatinine Clearance: 84.8 ml/min (by C-G formula based on Cr of 1.12).  Trough at goal.  Goal of Therapy:  Vancomycin trough level 10-20 Will target trough level 15-20 until osteo ruled out Eradicate infection.  Plan: Continue Vancomycin 1gm IV q8hrs Zosyn 3.375gm IV q8hrs to be infused over 4 hours Monitor labs, renal fxn, and cultures per protocol  Mady Gemma 04/04/2012,10:26 AM

## 2012-04-07 NOTE — Progress Notes (Signed)
Called by Dr. Rito Ehrlich regarding [facility] care for pt that was DC'd last week. At DC pt declined home health for wound care stating his wife would handle it as she had in the past. CM left message on voice mail regarding HH after DC. Pt will need to contact his PCP after DC for [facility] CM left call back number if pt wanted to discuss further.

## 2012-04-09 NOTE — Progress Notes (Signed)
UR Chart Review Completed  

## 2012-05-12 ENCOUNTER — Inpatient Hospital Stay (HOSPITAL_COMMUNITY)
Admission: EM | Admit: 2012-05-12 | Discharge: 2012-05-14 | DRG: 603 | Disposition: A | Discharge status: Home Health | Payer: Non-veteran care | Attending: Internal Medicine | Admitting: Internal Medicine

## 2012-05-12 ENCOUNTER — Encounter (HOSPITAL_COMMUNITY): Payer: Self-pay | Admitting: *Deleted

## 2012-05-12 DIAGNOSIS — E11622 Type 2 diabetes mellitus with other skin ulcer: Secondary | ICD-10-CM

## 2012-05-12 DIAGNOSIS — M4800 Spinal stenosis, site unspecified: Secondary | ICD-10-CM | POA: Diagnosis present

## 2012-05-12 DIAGNOSIS — Z8051 Family history of malignant neoplasm of kidney: Secondary | ICD-10-CM

## 2012-05-12 DIAGNOSIS — E1142 Type 2 diabetes mellitus with diabetic polyneuropathy: Secondary | ICD-10-CM | POA: Diagnosis present

## 2012-05-12 DIAGNOSIS — E119 Type 2 diabetes mellitus without complications: Secondary | ICD-10-CM | POA: Diagnosis present

## 2012-05-12 DIAGNOSIS — E039 Hypothyroidism, unspecified: Secondary | ICD-10-CM | POA: Diagnosis present

## 2012-05-12 DIAGNOSIS — E11628 Type 2 diabetes mellitus with other skin complications: Secondary | ICD-10-CM

## 2012-05-12 DIAGNOSIS — Z6834 Body mass index (BMI) 34.0-34.9, adult: Secondary | ICD-10-CM

## 2012-05-12 DIAGNOSIS — M109 Gout, unspecified: Secondary | ICD-10-CM | POA: Diagnosis present

## 2012-05-12 DIAGNOSIS — N400 Enlarged prostate without lower urinary tract symptoms: Secondary | ICD-10-CM | POA: Diagnosis present

## 2012-05-12 DIAGNOSIS — I83009 Varicose veins of unspecified lower extremity with ulcer of unspecified site: Secondary | ICD-10-CM | POA: Diagnosis present

## 2012-05-12 DIAGNOSIS — E1169 Type 2 diabetes mellitus with other specified complication: Secondary | ICD-10-CM

## 2012-05-12 DIAGNOSIS — E669 Obesity, unspecified: Secondary | ICD-10-CM | POA: Diagnosis present

## 2012-05-12 DIAGNOSIS — J4489 Other specified chronic obstructive pulmonary disease: Secondary | ICD-10-CM | POA: Diagnosis present

## 2012-05-12 DIAGNOSIS — E1149 Type 2 diabetes mellitus with other diabetic neurological complication: Secondary | ICD-10-CM | POA: Diagnosis present

## 2012-05-12 DIAGNOSIS — E1140 Type 2 diabetes mellitus with diabetic neuropathy, unspecified: Secondary | ICD-10-CM | POA: Diagnosis present

## 2012-05-12 DIAGNOSIS — L02419 Cutaneous abscess of limb, unspecified: Principal | ICD-10-CM | POA: Diagnosis present

## 2012-05-12 DIAGNOSIS — L03116 Cellulitis of left lower limb: Secondary | ICD-10-CM | POA: Diagnosis present

## 2012-05-12 DIAGNOSIS — Z8249 Family history of ischemic heart disease and other diseases of the circulatory system: Secondary | ICD-10-CM

## 2012-05-12 DIAGNOSIS — IMO0001 Reserved for inherently not codable concepts without codable children: Secondary | ICD-10-CM

## 2012-05-12 DIAGNOSIS — I872 Venous insufficiency (chronic) (peripheral): Secondary | ICD-10-CM | POA: Diagnosis present

## 2012-05-12 DIAGNOSIS — I4891 Unspecified atrial fibrillation: Secondary | ICD-10-CM

## 2012-05-12 DIAGNOSIS — L97909 Non-pressure chronic ulcer of unspecified part of unspecified lower leg with unspecified severity: Secondary | ICD-10-CM | POA: Diagnosis present

## 2012-05-12 DIAGNOSIS — Z8042 Family history of malignant neoplasm of prostate: Secondary | ICD-10-CM

## 2012-05-12 DIAGNOSIS — Z79899 Other long term (current) drug therapy: Secondary | ICD-10-CM

## 2012-05-12 DIAGNOSIS — D696 Thrombocytopenia, unspecified: Secondary | ICD-10-CM | POA: Diagnosis present

## 2012-05-12 DIAGNOSIS — F4310 Post-traumatic stress disorder, unspecified: Secondary | ICD-10-CM | POA: Diagnosis present

## 2012-05-12 DIAGNOSIS — I482 Chronic atrial fibrillation, unspecified: Secondary | ICD-10-CM | POA: Diagnosis present

## 2012-05-12 DIAGNOSIS — R791 Abnormal coagulation profile: Secondary | ICD-10-CM | POA: Diagnosis present

## 2012-05-12 DIAGNOSIS — L03119 Cellulitis of unspecified part of limb: Principal | ICD-10-CM | POA: Diagnosis present

## 2012-05-12 DIAGNOSIS — J449 Chronic obstructive pulmonary disease, unspecified: Secondary | ICD-10-CM | POA: Diagnosis present

## 2012-05-12 HISTORY — DX: Unspecified atrial fibrillation: I48.91

## 2012-05-12 LAB — CBC WITH DIFFERENTIAL/PLATELET
Basophils Relative: 0 % (ref 0–1)
Eosinophils Relative: 3 % (ref 0–5)
HCT: 39.7 % (ref 39.0–52.0)
Hemoglobin: 13.5 g/dL (ref 13.0–17.0)
MCH: 32.2 pg (ref 26.0–34.0)
MCHC: 34 g/dL (ref 30.0–36.0)
MCV: 94.7 fL (ref 78.0–100.0)
Monocytes Absolute: 0.5 10*3/uL (ref 0.1–1.0)
Monocytes Relative: 8 % (ref 3–12)
Neutro Abs: 4.3 10*3/uL (ref 1.7–7.7)
RDW: 13.6 % (ref 11.5–15.5)

## 2012-05-12 LAB — SEDIMENTATION RATE: Sed Rate: 13 mm/hr (ref 0–16)

## 2012-05-12 LAB — BASIC METABOLIC PANEL
BUN: 17 mg/dL (ref 6–23)
CO2: 29 mEq/L (ref 19–32)
Calcium: 9.8 mg/dL (ref 8.4–10.5)
Chloride: 99 mEq/L (ref 96–112)
Creatinine, Ser: 1.03 mg/dL (ref 0.50–1.35)
GFR calc Af Amer: 85 mL/min — ABNORMAL LOW (ref >90)

## 2012-05-12 MED ORDER — PANTOPRAZOLE SODIUM 40 MG PO TBEC
40.0000 mg | DELAYED_RELEASE_TABLET | Freq: Every day | ORAL | Status: DC
  Administered 2012-05-13 – 2012-05-14 (×2): 40 mg via ORAL
  Filled 2012-05-12 (×2): qty 1

## 2012-05-12 MED ORDER — ONDANSETRON HCL 4 MG PO TABS: 4.0000 mg | ORAL_TABLET | Freq: Four times a day (QID) | ORAL | Status: DC | PRN

## 2012-05-12 MED ORDER — FUROSEMIDE 40 MG PO TABS
40.0000 mg | ORAL_TABLET | Freq: Every day | ORAL | Status: DC
  Administered 2012-05-13: 40 mg via ORAL
  Filled 2012-05-12: qty 1

## 2012-05-12 MED ORDER — ALBUTEROL SULFATE HFA 108 (90 BASE) MCG/ACT IN AERS: 2.0000 | INHALATION_SPRAY | Freq: Four times a day (QID) | RESPIRATORY_TRACT | Status: DC | PRN

## 2012-05-12 MED ORDER — ENOXAPARIN SODIUM 40 MG/0.4ML ~~LOC~~ SOLN
40.0000 mg | SUBCUTANEOUS | Status: DC
  Filled 2012-05-12: qty 0.4

## 2012-05-12 MED ORDER — FINASTERIDE 5 MG PO TABS
5.0000 mg | ORAL_TABLET | Freq: Every day | ORAL | Status: DC
  Administered 2012-05-13 – 2012-05-14 (×2): 5 mg via ORAL
  Filled 2012-05-12 (×3): qty 1

## 2012-05-12 MED ORDER — SENNA 8.6 MG PO TABS
2.0000 | ORAL_TABLET | Freq: Every day | ORAL | Status: DC
  Administered 2012-05-12 – 2012-05-13 (×2): 17.2 mg via ORAL
  Filled 2012-05-12 (×2): qty 2

## 2012-05-12 MED ORDER — VANCOMYCIN HCL IN DEXTROSE 1-5 GM/200ML-% IV SOLN
1000.0000 mg | Freq: Two times a day (BID) | INTRAVENOUS | Status: DC
  Administered 2012-05-13 – 2012-05-14 (×2): 1000 mg via INTRAVENOUS
  Filled 2012-05-12 (×5): qty 200

## 2012-05-12 MED ORDER — PIPERACILLIN-TAZOBACTAM 3.375 G IVPB
3.3750 g | Freq: Three times a day (TID) | INTRAVENOUS | Status: DC
  Administered 2012-05-12 – 2012-05-14 (×5): 3.375 g via INTRAVENOUS
  Filled 2012-05-12 (×9): qty 50

## 2012-05-12 MED ORDER — PIPERACILLIN-TAZOBACTAM 3.375 G IVPB
INTRAVENOUS | Status: AC
  Filled 2012-05-12: qty 100

## 2012-05-12 MED ORDER — ONDANSETRON HCL 4 MG/2ML IJ SOLN: 4.0000 mg | Freq: Four times a day (QID) | INTRAMUSCULAR | Status: DC | PRN

## 2012-05-12 MED ORDER — METFORMIN HCL 500 MG PO TABS
1000.0000 mg | ORAL_TABLET | Freq: Every day | ORAL | Status: DC
  Administered 2012-05-13 – 2012-05-14 (×2): 1000 mg via ORAL
  Filled 2012-05-12: qty 2
  Filled 2012-05-12: qty 1

## 2012-05-12 MED ORDER — ACETAMINOPHEN 650 MG RE SUPP: 650.0000 mg | Freq: Four times a day (QID) | RECTAL | Status: DC | PRN

## 2012-05-12 MED ORDER — GLIPIZIDE ER 2.5 MG PO TB24
2.5000 mg | ORAL_TABLET | Freq: Every morning | ORAL | Status: DC
  Administered 2012-05-13 – 2012-05-14 (×2): 2.5 mg via ORAL
  Filled 2012-05-12 (×3): qty 1

## 2012-05-12 MED ORDER — VANCOMYCIN HCL 10 G IV SOLR
1500.0000 mg | Freq: Once | INTRAVENOUS | Status: AC
  Administered 2012-05-12: 1500 mg via INTRAVENOUS
  Filled 2012-05-12: qty 1500

## 2012-05-12 MED ORDER — COLCHICINE 0.6 MG PO TABS
0.6000 mg | ORAL_TABLET | Freq: Every day | ORAL | Status: DC
  Administered 2012-05-13 – 2012-05-14 (×2): 0.6 mg via ORAL
  Filled 2012-05-12 (×2): qty 1

## 2012-05-12 MED ORDER — OXYCODONE HCL 5 MG PO TABS
5.0000 mg | ORAL_TABLET | ORAL | Status: DC | PRN
  Administered 2012-05-12 – 2012-05-13 (×3): 5 mg via ORAL
  Filled 2012-05-12 (×3): qty 1

## 2012-05-12 MED ORDER — LEVOTHYROXINE SODIUM 100 MCG PO TABS
200.0000 ug | ORAL_TABLET | Freq: Every day | ORAL | Status: DC
  Administered 2012-05-13 – 2012-05-14 (×2): 200 ug via ORAL
  Filled 2012-05-12 (×2): qty 2

## 2012-05-12 MED ORDER — ACETAMINOPHEN 325 MG PO TABS
650.0000 mg | ORAL_TABLET | Freq: Four times a day (QID) | ORAL | Status: DC | PRN
  Administered 2012-05-12: 650 mg via ORAL
  Filled 2012-05-12: qty 2

## 2012-05-12 MED ORDER — TRAZODONE HCL 50 MG PO TABS
25.0000 mg | ORAL_TABLET | Freq: Every day | ORAL | Status: DC
  Administered 2012-05-12 – 2012-05-13 (×2): 25 mg via ORAL
  Filled 2012-05-12 (×2): qty 1

## 2012-05-12 MED ORDER — METFORMIN HCL 500 MG PO TABS
500.0000 mg | ORAL_TABLET | Freq: Every day | ORAL | Status: DC
  Administered 2012-05-13: 500 mg via ORAL
  Filled 2012-05-12 (×2): qty 1

## 2012-05-12 MED ORDER — ALLOPURINOL 300 MG PO TABS
300.0000 mg | ORAL_TABLET | Freq: Every day | ORAL | Status: DC
  Administered 2012-05-13 – 2012-05-14 (×2): 300 mg via ORAL
  Filled 2012-05-12 (×2): qty 1

## 2012-05-12 MED ORDER — VANCOMYCIN HCL IN DEXTROSE 1-5 GM/200ML-% IV SOLN
1000.0000 mg | Freq: Once | INTRAVENOUS | Status: AC
  Administered 2012-05-12: 1000 mg via INTRAVENOUS
  Filled 2012-05-12: qty 200

## 2012-05-12 MED ORDER — ASPIRIN EC 81 MG PO TBEC
81.0000 mg | DELAYED_RELEASE_TABLET | Freq: Every day | ORAL | Status: DC
  Administered 2012-05-13 – 2012-05-14 (×2): 81 mg via ORAL
  Filled 2012-05-12 (×2): qty 1

## 2012-05-12 MED ORDER — GABAPENTIN 400 MG PO CAPS
400.0000 mg | ORAL_CAPSULE | Freq: Three times a day (TID) | ORAL | Status: DC | PRN
  Administered 2012-05-12: 400 mg via ORAL
  Filled 2012-05-12: qty 1

## 2012-05-12 NOTE — Progress Notes (Signed)
ANTIBIOTIC CONSULT NOTE  Pharmacy Consult for Vancomycin Indication: Cellulitis  Allergies  Allergen Reactions  . Terazosin     Patient is not familiar with this medication as an allergy   Patient Measurements: Height: 6' (182.9 cm) Weight: 252 lb 3.3 oz (114.4 kg) IBW/kg (Calculated) : 77.6  Vital Signs: Temp: 98.2 F (36.8 C) (03/25 1834) Temp src: Oral (03/25 1834) BP: 141/77 mmHg (03/25 1834) Pulse Rate: 50 (03/25 1834) Intake/Output from previous day:   Intake/Output from this shift:    Labs:  Recent Labs  05/12/12 1443  WBC 5.9  HGB 13.5  PLT 140*  CREATININE 1.03   Estimated Creatinine Clearance: 92.1 ml/min (by C-G formula based on Cr of 1.03). No results found for this basename: VANCOTROUGH, VANCOPEAK, VANCORANDOM, GENTTROUGH, GENTPEAK, GENTRANDOM, TOBRATROUGH, TOBRAPEAK, TOBRARND, AMIKACINPEAK, AMIKACINTROU, AMIKACIN,  in the last 72 hours   Microbiology: No results found for this or any previous visit (from the past 720 hour(s)).  Medical History: Past Medical History  Diagnosis Date  . Neuropathy   . COPD (chronic obstructive pulmonary disease)   . Diabetes mellitus   . Spinal stenosis   . Atrial fibrillation   . PTSD (post-traumatic stress disorder)    Medications:  Prescriptions prior to admission  Medication Sig Dispense Refill  . allopurinol (ZYLOPRIM) 300 MG tablet Take 300 mg by mouth daily.        Marland Kitchen aspirin EC 81 MG tablet Take 81 mg by mouth daily.        . colchicine 0.6 MG tablet Take 0.6 mg by mouth daily.       . finasteride (PROSCAR) 5 MG tablet Take 5 mg by mouth daily.        Marland Kitchen gabapentin (NEURONTIN) 400 MG capsule Take 400 mg by mouth 3 (three) times daily as needed (Pain).      Marland Kitchen glipiZIDE (GLUCOTROL XL) 5 MG 24 hr tablet Take 2.5 mg by mouth every morning.       Marland Kitchen levothyroxine (SYNTHROID, LEVOTHROID) 100 MCG tablet Take 200 mcg by mouth every morning.       . metFORMIN (GLUCOPHAGE) 1000 MG tablet Take 1,000 mg by mouth  daily. Takes in the morning      . metFORMIN (GLUCOPHAGE) 500 MG tablet Take 500 mg by mouth daily. Takes in the evening.      Marland Kitchen omeprazole (PRILOSEC) 20 MG capsule Take 20 mg by mouth at bedtime.       Marland Kitchen oxyCODONE (OXY IR/ROXICODONE) 5 MG immediate release tablet Take 5 mg by mouth every 4 (four) hours as needed for pain.      Marland Kitchen senna (SENOKOT) 8.6 MG tablet Take 2-3 tablets by mouth at bedtime. Takes 2 tablets in the morning and 3 at bedtime. For constipation      . traZODone (DESYREL) 50 MG tablet Take 25 mg by mouth at bedtime.      Marland Kitchen warfarin (COUMADIN) 5 MG tablet Take 7.5 mg by mouth daily.      Marland Kitchen albuterol (PROVENTIL HFA;VENTOLIN HFA) 108 (90 BASE) MCG/ACT inhaler Inhale 2 puffs into the lungs every 6 (six) hours as needed. Shortness of breath        Assessment: Okay for Protocol Obese male being treated for cellulitis Estimated Creatinine Clearance: 92.1 ml/min (by C-G formula based on Cr of 1.03). Patient has received 1000mg  in ED earlier this afternoon. Normalized CrCl = 71 ml/min.  Goal of Therapy:  Vancomycin trough level 10-15 mcg/ml  Plan:  Additional 1500mg  IV this  evening, followed by 1000mg  IV every 12 hours. Measure antibiotic drug levels at steady state Follow up culture results  Mady Gemma 05/12/2012,6:59 PM

## 2012-05-12 NOTE — H&P (Signed)
[facility] History and Physical  Tony [name] ZOX:[id number] DOB: 04/12/1945 DOA: 05/12/2012  Referring physician:  PCP: [facility], MD  Specialists:   Chief Complaint: lower extremity left leg infection  HPI: Tony [name] is a 67 y.o. male with pmhx that includes, afib on coumadin, DM, neuropathy, PTSD,  COPD, recurrent cellulitis gout presents to ED with cc lower extremity infection. Of note patient discharged 04/02/12 after hospitalization for same. He reports that he had been doing well and area was healing. He reports dressing becomes saturated daily with clear fluid. He reported this to [place] and SNAP kit sent to him for dressing changes. He has not started this dressing as of yet. He reports 3 days ago "blisters" developed on lateral aspect of left ankle. These blisters quickly burst and drained clear fluid. These blisters have continued to develop and burst over last 3 days. He decided to come to ED as symptoms persisted. He reports chronic pain in LE due to diabetic neuropathy and this wound intensifies pain. Describes the pain as stinging and pins and needles.  Pain is rated 6/10 at worst. He states movement makes worse and nothing makes better. He denies n/v/diarrhea. He denies fever, chills, anorexia. In the ED he is afebrile, with normal white count. He is non-toxic appearing. Symptoms came on gradually have persisted and worsened. Arterial dopplers, ABIs show no significant disease during last hospitalization.  We are asked to admit.   Review of Systems: The patient denies anorexia, fever, weight loss,, vision loss, decreased hearing, hoarseness, chest pain, syncope, dyspnea on exertion,  hemoptysis, abdominal pain, melena, hematochezia, severe indigestion/heartburn, hematuria, incontinence, genital sores, muscle weakness,  transient blindness, difficulty walking,  unusual weight change, abnormal bleeding, enlarged lymph nodes, angioedema, and breast masses.    Past  Medical History  Diagnosis Date  . Neuropathy   . COPD (chronic obstructive pulmonary disease)   . Diabetes mellitus   . Spinal stenosis   . Atrial fibrillation   . PTSD (post-traumatic stress disorder)    Past Surgical History  Procedure Laterality Date  . Cholecystectomy     Social History:  reports that he has never smoked. He does not have any smokeless tobacco history on file. He reports that he does not drink alcohol or use illicit drugs. Pt is a Product/process development scientist, a disabled/retired Hydrographic surveyor. Lives with his wife and 2 grown children Allergies  Allergen Reactions  . Terazosin     Patient is not familiar with this medication as an allergy    Family History  Problem Relation Age of Onset  . Heart disease Mother   . Prostate cancer Brother   . Kidney cancer Brother     Prior to Admission medications   Medication Sig Start Date End Date Taking? Authorizing Provider  allopurinol (ZYLOPRIM) 300 MG tablet Take 300 mg by mouth daily.     Yes Historical Provider, MD  aspirin EC 81 MG tablet Take 81 mg by mouth daily.     Yes Historical Provider, MD  colchicine 0.6 MG tablet Take 0.6 mg by mouth daily.    Yes Historical Provider, MD  finasteride (PROSCAR) 5 MG tablet Take 5 mg by mouth daily.     Yes Historical Provider, MD  gabapentin (NEURONTIN) 400 MG capsule Take 400 mg by mouth 3 (three) times daily as needed (Pain). 04/04/12  Yes Hollice Espy, MD  glipiZIDE (GLUCOTROL XL) 5 MG 24 hr tablet Take 2.5 mg by mouth every  morning.    Yes Historical Provider, MD  levothyroxine (SYNTHROID, LEVOTHROID) 100 MCG tablet Take 200 mcg by mouth every morning.    Yes Historical Provider, MD  metFORMIN (GLUCOPHAGE) 1000 MG tablet Take 1,000 mg by mouth daily. Takes in the morning   Yes Historical Provider, MD  metFORMIN (GLUCOPHAGE) 500 MG tablet Take 500 mg by mouth daily. Takes in the evening.   Yes Historical Provider, MD  omeprazole (PRILOSEC) 20 MG capsule Take 20 mg by  mouth at bedtime.    Yes Historical Provider, MD  oxyCODONE (OXY IR/ROXICODONE) 5 MG immediate release tablet Take 5 mg by mouth every 4 (four) hours as needed for pain.   Yes Historical Provider, MD  senna (SENOKOT) 8.6 MG tablet Take 2-3 tablets by mouth at bedtime. Takes 2 tablets in the morning and 3 at bedtime. For constipation   Yes Historical Provider, MD  traZODone (DESYREL) 50 MG tablet Take 25 mg by mouth at bedtime.   Yes Historical Provider, MD  warfarin (COUMADIN) 5 MG tablet Take 7.5 mg by mouth daily.   Yes Historical Provider, MD  albuterol (PROVENTIL HFA;VENTOLIN HFA) 108 (90 BASE) MCG/ACT inhaler Inhale 2 puffs into the lungs every 6 (six) hours as needed. Shortness of breath     Historical Provider, MD   Physical Exam: Filed Vitals:   05/12/12 1433 05/12/12 1518  BP: 150/63   Pulse: 52   Temp: 98.8 F (37.1 C)   TempSrc: Oral   Resp: 19   SpO2:  98%     General:  Well nourished, somewhat unkept NAD  Eyes: PERRL EOMI no scleral icteru  ENT: ears clear, no nasal drainage, mucus membrane mouth pink slightly dry, poor dentition  Neck: supple no JVD No lymphadenopathy  Cardiovascular: RRR No murmur, rub. 1+ LE edema bilaterally  Respiratory: normal effort BS clear bilaterally no wheeze/rhonchi  Abdomen: obese soft +BS-nontender  Skin: Lateral aspect left leg just above ankle with large open wound tissue white in middle and red on edges, mild surrounding cellulitis draining clear drainage. Brawny edema   Musculoskeletal: no joint swelling or erythema. Toes on left 3rd toe slightly pink/swollen. Non-tender to palpation.   Psychiatric: cooperative, calm   Neurologic: cranial nerve II-XII intact. Speech clear. Facial symmetry  Labs on Admission:  Basic Metabolic Panel:  Recent Labs Lab 05/12/12 1443  NA 136  K 4.2  CL 99  CO2 29  GLUCOSE 131*  BUN 17  CREATININE 1.03  CALCIUM 9.8   Liver Function Tests: No results found for this basename: AST, ALT,  ALKPHOS, BILITOT, PROT, ALBUMIN,  in the last 168 hours No results found for this basename: LIPASE, AMYLASE,  in the last 168 hours No results found for this basename: AMMONIA,  in the last 168 hours CBC:  Recent Labs Lab 05/12/12 1443  WBC 5.9  NEUTROABS 4.3  HGB 13.5  HCT 39.7  MCV 94.7  PLT 140*   Cardiac Enzymes: No results found for this basename: CKTOTAL, CKMB, CKMBINDEX, TROPONINI,  in the last 168 hours  BNP (last 3 results) No results found for this basename: PROBNP,  in the last 8760 hours CBG: No results found for this basename: GLUCAP,  in the last 168 hours  Radiological Exams on Admission: No results found.  EKG: Independently reviewed.   Assessment/Plan Principal Problem:   Cellulitis of left lower extremity with blistering and subsequent ulcer formation: recurrent. Will admit to medical floor. Will provide vanc and zosyn per pharmacy for broad coverage.  Patient at risk for MRSA or polymicrobial infections, as he is diabetic and this is recurrent, recent hospitalizations. Currently afebrile, non-toxic appearing. Will request consult wound care. Once acute infection resolves may be candidate for [employer]. Will keep leg elevated. Will provide lasix for the edema. Doubt long term IV antibiotics will be needed, mainly, management of swelling and chronic wounds. Also, patient's "medical service dog" appears to be climbing all over his legs, which may be contributing to his recurrent infections. Active Problems:   Diabetic neuropathy: continue gabapentin at home dose   A-fib: coumadin per pharmacy. INR 3.14.    Hypothyroidism: TSH 0.48 1 month ago. Continue synthroid   Diabetes type 2, controlled: on metformin and glipizide. Does not check CBG at home as machine broken for 2 years. Will continue home meds. Carb modified diet. Will check A1c. Last one 11/14 6.9   COPD (chronic obstructive pulmonary disease): currently stable at baseline. Continue home inhaler    Supratherapeutic INR: coumadin per pharmacy. Hx afib.  Gout: stable. No s/sx flare. Continue allopurinol  BPH: stable at baseline. Continue home med  Code Status: full code Family Communication: pt at bedside Disposition Plan: Home when ready  Time spent: 45 minutes  [facility]   If 7PM-7AM, please contact night-coverage [email or web address] Password [facility] 05/12/2012, 5:04 PM  Attending note  Patient interviewed and examined with Ms. black. Note amended in bold. 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 Does appear to have a mild component of cellulitis, but mainly needs control of swelling, and chronic wound care. Will consult wound care nurse. Patient refuses to have family take his dog home. It is not safe for him to leave the unit to toilet the dog. I will ask the a.c. to evaluate our policy regarding service animals.   Crista Curb, M.D.

## 2012-05-12 NOTE — ED Provider Notes (Signed)
History     CSN: [id number]  Arrival date & time 05/12/12  1410   First MD Initiated Contact with Patient 05/12/12 1431      Chief Complaint  Patient presents with  . Recurrent Skin Infections    (Consider location/radiation/quality/duration/timing/severity/associated sxs/prior treatment) The history is provided by the patient.   68 year old male has been having problems with cellulitis in his left lower leg. He has been admitted to the hospital for treatment of same. 2 days ago, he started having blisters come up on the leg which would then pop and drain and there is increased erythema and pain. Pain is moderate and he rates it at 4/10. He denies fever, chills, sweats. He is applied dressings but it is not improving. It is actually getting worse.  Past Medical History  Diagnosis Date  . Neuropathy   . COPD (chronic obstructive pulmonary disease)   . Diabetes mellitus   . Spinal stenosis   . A-fib   . A-fib   . A-fib   . PTSD (post-traumatic stress disorder)     Past Surgical History  Procedure Laterality Date  . Cholecystectomy      Family History  Problem Relation Age of Onset  . Heart disease Mother   . Prostate cancer Brother   . Kidney cancer Brother     History  Substance Use Topics  . Smoking status: Never Smoker   . Smokeless tobacco: Not on file  . Alcohol Use: No      Review of Systems  All other systems reviewed and are negative.    Allergies  Terazosin  Home Medications   Current Outpatient Rx  Name  Route  Sig  Dispense  Refill  . albuterol (PROVENTIL HFA;VENTOLIN HFA) 108 (90 BASE) MCG/ACT inhaler   Inhalation   Inhale 2 puffs into the lungs every 6 (six) hours as needed. Shortness of breath          . allopurinol (ZYLOPRIM) 300 MG tablet   Oral   Take 300 mg by mouth daily.           Marland Kitchen aspirin EC 81 MG tablet   Oral   Take 81 mg by mouth daily.           . citalopram (CELEXA) 40 MG tablet   Oral   Take 20 mg by mouth  daily.           . colchicine 0.6 MG tablet   Oral   Take 0.6 mg by mouth every evening.          . diphenhydramine-acetaminophen (TYLENOL PM) 25-500 MG TABS   Oral   Take 2-3 tablets by mouth at bedtime as needed (for sleep).         . finasteride (PROSCAR) 5 MG tablet   Oral   Take 5 mg by mouth daily.           Marland Kitchen gabapentin (NEURONTIN) 400 MG capsule   Oral   Take 1 capsule (400 mg total) by mouth 3 (three) times daily.   90 capsule   0   . glipiZIDE (GLUCOTROL XL) 5 MG 24 hr tablet   Oral   Take 2.5 mg by mouth every morning.          Marland Kitchen levothyroxine (SYNTHROID, LEVOTHROID) 100 MCG tablet   Oral   Take 200 mcg by mouth every morning.          Marland Kitchen lisinopril (PRINIVIL,ZESTRIL) 10 MG tablet   Oral  Take 5 mg by mouth every evening.          . metFORMIN (GLUCOPHAGE) 500 MG tablet   Oral   Take 500 mg by mouth 2 (two) times daily.           . mupirocin ointment (BACTROBAN) 2 %   Topical   Apply topically 2 (two) times daily.   22 g   1   . omeprazole (PRILOSEC) 20 MG capsule   Oral   Take 20 mg by mouth at bedtime.          Marland Kitchen oxyCODONE 10 MG TABS   Oral   Take 1 tablet (10 mg total) by mouth every 6 (six) hours as needed for pain.   30 tablet   0   . senna (SENOKOT) 8.6 MG tablet   Oral   Take 3 tablets by mouth at bedtime. For constipation         . warfarin (COUMADIN) 5 MG tablet   Oral   Take 7.5 mg by mouth every evening.            BP 150/63  Pulse 52  Temp(Src) 98.8 F (37.1 C) (Oral)  Resp 19  Physical Exam  Nursing note and vitals reviewed.  67 year old male, resting comfortably and in no acute distress. Vital signs are significant for bradycardia with heart rate of 52, and hypertension with blood pressure 150/63.  Head is normocephalic and atraumatic. PERRLA, EOMI. Oropharynx is clear. Neck is nontender and supple without adenopathy or JVD. Back is nontender and there is no CVA tenderness. Lungs are clear without  rales, wheezes, or rhonchi. Chest is nontender. Heart has regular rate and rhythm without murmur. Abdomen is soft, flat, nontender without masses or hepatosplenomegaly and peristalsis is normoactive. Extremities: Marked venous stasis changes are present bilaterally. On the lateral aspect of the left lower leg and ankle are areas of desquamation with drainage of serous fluid. There is also an area of desquamation over the dorsum of the left foot. The foot is warm and capillary refill is 2 seconds. There no lymphangitic streaks. 1+ edema is present bilaterally. Skin is warm and dry without rash. Neurologic: Mental status is normal, cranial nerves are intact, there are no motor or sensory deficits.  ED Course  Procedures (including critical care time)  Results for orders placed during the hospital encounter of 05/12/12  CBC WITH DIFFERENTIAL      Result Value Range   WBC 5.9  4.0 - 10.5 K/uL   RBC 4.19 (*) 4.22 - 5.81 MIL/uL   Hemoglobin 13.5  13.0 - 17.0 g/dL   HCT 16.1  09.6 - 04.5 %   MCV 94.7  78.0 - 100.0 fL   MCH 32.2  26.0 - 34.0 pg   MCHC 34.0  30.0 - 36.0 g/dL   RDW 40.9  81.1 - 91.4 %   Platelets 140 (*) 150 - 400 K/uL   Neutrophils Relative 74  43 - 77 %   Neutro Abs 4.3  1.7 - 7.7 K/uL   Lymphocytes Relative 16  12 - 46 %   Lymphs Abs 0.9  0.7 - 4.0 K/uL   Monocytes Relative 8  3 - 12 %   Monocytes Absolute 0.5  0.1 - 1.0 K/uL   Eosinophils Relative 3  0 - 5 %   Eosinophils Absolute 0.2  0.0 - 0.7 K/uL   Basophils Relative 0  0 - 1 %   Basophils Absolute 0.0  0.0 -  0.1 K/uL  BASIC METABOLIC PANEL      Result Value Range   Sodium 136  135 - 145 mEq/L   Potassium 4.2  3.5 - 5.1 mEq/L   Chloride 99  96 - 112 mEq/L   CO2 29  19 - 32 mEq/L   Glucose, Bld 131 (*) 70 - 99 mg/dL   BUN 17  6 - 23 mg/dL   Creatinine, Ser 1.61  0.50 - 1.35 mg/dL   Calcium 9.8  8.4 - 09.6 mg/dL   GFR calc non Af Amer 74 (*) >90 mL/min   GFR calc Af Amer 85 (*) >90 mL/min  PROTIME-INR       Result Value Range   Prothrombin Time 30.6 (*) 11.6 - 15.2 seconds   INR 3.14 (*) 0.00 - 1.49     1. Cellulitis of left lower leg   2. 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 Old records are reviewed and he had been at bed for same with discharge on February 15. I suspect that he never had complete healing of the cellulitis. He will need to be restarted on antibiotics. He may need 4-6 weeks of antibiotics through a PICC line given his tendency towards recurrence of cellulitis. He is started on vancomycin in the emergency department.  Laboratory workup is unremarkable. INR is slightly supratherapeutic and platelet count is mildly low. Sedimentation rate is still pending. Case is discussed with Dr. Lendell Caprice of [facility] who agrees to come and evaluate the patient.      Dione Booze, MD 05/12/12 [phone]

## 2012-05-12 NOTE — ED Notes (Signed)
Cellulitis lt lower leg, hx of same.  Worsening for 2 days

## 2012-05-13 DIAGNOSIS — I83009 Varicose veins of unspecified lower extremity with ulcer of unspecified site: Secondary | ICD-10-CM

## 2012-05-13 LAB — GLUCOSE, CAPILLARY
Glucose-Capillary: 107 mg/dL — ABNORMAL HIGH (ref 70–99)
Glucose-Capillary: 138 mg/dL — ABNORMAL HIGH (ref 70–99)
Glucose-Capillary: 112 mg/dL — ABNORMAL HIGH (ref 70–99)

## 2012-05-13 LAB — BASIC METABOLIC PANEL
CO2: 28 mEq/L (ref 19–32)
Chloride: 102 mEq/L (ref 96–112)
Glucose, Bld: 109 mg/dL — ABNORMAL HIGH (ref 70–99)
Potassium: 3.9 mEq/L (ref 3.5–5.1)
Sodium: 139 mEq/L (ref 135–145)

## 2012-05-13 MED ORDER — WARFARIN SODIUM 5 MG PO TABS
5.0000 mg | ORAL_TABLET | Freq: Once | ORAL | Status: AC
  Administered 2012-05-13: 5 mg via ORAL
  Filled 2012-05-13: qty 1

## 2012-05-13 MED ORDER — WARFARIN - PHARMACIST DOSING INPATIENT: Status: DC

## 2012-05-13 MED ORDER — GABAPENTIN 400 MG PO CAPS
400.0000 mg | ORAL_CAPSULE | Freq: Three times a day (TID) | ORAL | Status: DC
Start: 2012-05-13 — End: 2012-05-14
  Administered 2012-05-13 – 2012-05-14 (×2): 400 mg via ORAL
  Filled 2012-05-13 (×2): qty 1

## 2012-05-13 MED ORDER — OXYCODONE HCL 5 MG PO TABS
5.0000 mg | ORAL_TABLET | ORAL | Status: DC | PRN
  Administered 2012-05-13: 10 mg via ORAL
  Administered 2012-05-13: 5 mg via ORAL
  Administered 2012-05-14: 10 mg via ORAL
  Filled 2012-05-13: qty 2
  Filled 2012-05-13: qty 1
  Filled 2012-05-13: qty 2

## 2012-05-13 MED ORDER — FUROSEMIDE 10 MG/ML IJ SOLN
40.0000 mg | Freq: Two times a day (BID) | INTRAMUSCULAR | Status: DC
  Administered 2012-05-14: 40 mg via INTRAVENOUS
  Filled 2012-05-13 (×2): qty 4

## 2012-05-13 NOTE — Progress Notes (Signed)
ANTICOAGULATION CONSULT NOTE - Initial Consult  Pharmacy Consult for Coumadin Indication: atrial fibrillation  Allergies  Allergen Reactions  . Terazosin     Patient is not familiar with this medication as an allergy    Patient Measurements: Height: 6' (182.9 cm) Weight: 252 lb 3.3 oz (114.4 kg) IBW/kg (Calculated) : 77.6  Vital Signs: Temp: 98 F (36.7 C) (03/26 0503) Temp src: Oral (03/26 0503) BP: 105/57 mmHg (03/26 0503) Pulse Rate: 42 (03/26 0503)  Labs:  Recent Labs  05/12/12 1443 05/13/12 0506  HGB 13.5  --   HCT 39.7  --   PLT 140*  --   LABPROT 30.6* 27.5*  INR 3.14* 2.72*  CREATININE 1.03 1.15    Estimated Creatinine Clearance: 82.5 ml/min (by C-G formula based on Cr of 1.15).   Medical History: Past Medical History  Diagnosis Date  . Neuropathy   . COPD (chronic obstructive pulmonary disease)   . Diabetes mellitus   . Spinal stenosis   . Atrial fibrillation   . PTSD (post-traumatic stress disorder)     Medications:  Scheduled:  . allopurinol  300 mg Oral Daily  . aspirin EC  81 mg Oral Daily  . colchicine  0.6 mg Oral Daily  . enoxaparin (LOVENOX) injection  40 mg Subcutaneous Q24H  . finasteride  5 mg Oral Daily  . furosemide  40 mg Oral Daily  . glipiZIDE  2.5 mg Oral q morning - 10a  . levothyroxine  200 mcg Oral QAC breakfast  . metFORMIN  1,000 mg Oral Q breakfast  . metFORMIN  500 mg Oral Q supper  . pantoprazole  40 mg Oral Daily  . piperacillin-tazobactam (ZOSYN)  IV  3.375 g Intravenous Q8H  . senna  2 tablet Oral QHS  . traZODone  25 mg Oral QHS  . [COMPLETED] vancomycin  1,500 mg Intravenous Once   Followed by  . vancomycin  1,000 mg Intravenous Q12H  . [COMPLETED] vancomycin  1,000 mg Intravenous Once    Assessment: 67 yo M on chronic warfarin 7.5mg  daily for Afib.  INR was supra-therapeutic on admission but is within goal range today. No bleeding noted.   Goal of Therapy:  INR 2-3   Plan:  Coumadin 5mg  po x1 today   Daily INR  Brayden Brodhead, [facility] 05/13/2012,2:39 PM

## 2012-05-13 NOTE — Consult Note (Signed)
WOC consult Note Reason for Consult: Consult requested for left leg.  Performed via remote consult with bedside nurse assistance.  Pt familiar to [facility] nurse from previous admission, refer to consult note from 2/14.  Pt states his leg was doing better after discharge until it began draining and blistering this week.  He is followed as an outpatient by the [place].Marland Kitchen  History of diabetes and gout. [facility] SNAP dressing to apply to the wound, but he did not know how to apply and his home health nurse was also not familiar with this product, so it was never used. Wound type: Several chronic full thickness stasis wounds, cellulitis surrounding.  On IV abx at this time. Measurement: 2.5X1X.1cm  Dark red, dry scabbed area without open wound or drainage. Left calf wounds: f 9X6.5X.2cm 1X2X.1cm .5X1X.1cm 9X1.5X.1cm Left anterior foot 1X1cm dry intact eschar. Wound bed: All calf wounds red and moist  Drainage (amount, consistency, odor) Large amt yellow drainage Periwound: surrounded by peeling skin where blisters ruptured. Dressing procedure/placement/frequency: Aquacel to absorb drainage and provide antimicrobial benefits.    Please order Home health: Pt will need home health assistance with dressing changes; he states he is unable to reach site to change dressing. [facility]. Please re-consult if further assistance is needed.  Thank-you,  Cammie Mcgee MSN, RN, CWOCN, [place], CNS [phone]

## 2012-05-13 NOTE — Progress Notes (Signed)
UR Chart Review Completed  

## 2012-05-13 NOTE — Care Management Note (Unsigned)
    Page 1 of 1   05/14/2012     11:36:36 AM   CARE MANAGEMENT NOTE 05/14/2012  Patient:  VALENTINE, BARNEY   Account Number:  [id number]  Date Initiated:  05/13/2012  Documentation initiated by:  Rosemary Holms  Subjective/Objective Assessment:   Pt admitted from home where he lives with his wife. States barriers to care are transportation and finances. 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 Pt elected not to transfer to the [facility] hospital and form faxed to Jacki Cones in the     Action/Plan:   Transfer dept declining transfer. Pt has had HH through [facility] which has been confirmed. Will need resumption order at DC per [facility].   Anticipated DC Date:  05/15/2012   Anticipated DC Plan:  HOME W HOME HEALTH SERVICES      DC Planning Services  CM consult      Choice offered to / List presented to:          [facility] arranged  HH-1 RN      [facility] agency  [facility]   Status of service:  Completed, signed off Medicare Important Message given?  NA - LOS <3 / Initial given by admissions (If response is "NO", the following Medicare IM given date fields will be blank) Date Medicare IM given:   Date Additional Medicare IM given:    Discharge Disposition:  HOME W HOME HEALTH SERVICES  Per UR Regulation:    If discussed at Long Length of Stay Meetings, dates discussed:    Comments:  05/14/12 Rosemary Holms RN BSN CM DC summary, resumption of [facility] and WOC sent to [facility]  05/13/12 Raeann Offner RN BSN CM WOC and DC summary needs to be sent to [facility] with a resume HH order.

## 2012-05-13 NOTE — Progress Notes (Signed)
[facility] PROGRESS NOTE  ISAEL STILLE ZOX:[id number] DOB: [birth year] DOA: 05/12/2012 PCP: [facility], MD  Assessment/Plan: Cellulitis of left lower extremity: recurrence. Area stable in terms of size but increased intensity of erythema and heat. Continue vanc& zosyn day #2. Max temp 99.9. Remains non-toxic appearing. Await wound care consult. Once acute infection resolves may be candidate for [employer]. Keep leg elevated. Slightly less edematous. Will continue lasix. edema.  Active Problems:  Diabetic neuropathy:  Stable at baseline. continue gabapentin at home dose   A-fib: coumadin per pharmacy. INR 2.72 .  Hypothyroidism: TSH 0.48 1 month ago. Continue synthroid   Diabetes type 2, controlled: on metformin and glipizide. CBG range 107-174. Carb modified diet. A1c 6.3.    COPD (chronic obstructive pulmonary disease): currently stable at baseline. Continue home inhaler   Supratherapeutic INR: Resolved. Hx afib.   Gout: stable. No s/sx flare. Continue allopurinol   BPH: stable at baseline. Continue home med     Code Status: full Family Communication: pt at bedside Disposition Plan: home when ready hopefully day or two   Consultants:  Wound care  Procedures:  none  Antibiotics:  Vanc 05/12/12>>>  Zosyn 05/12/12>>>  HPI/Subjective: In bed easily aroused. Reports intermittent pain to left foot. NAD  Objective: Filed Vitals:   05/12/12 1834 05/12/12 2045 05/13/12 0503 05/13/12 0836  BP: 141/77 124/73 105/57   Pulse: 50 60 42   Temp: 98.2 F (36.8 C) 99.9 F (37.7 C) 98 F (36.7 C)   TempSrc: Oral Oral Oral   Resp:  18 20   Height: 6' (1.829 m)     Weight: 114.4 kg (252 lb 3.3 oz)     SpO2: 98% 100% 98% 98%   No intake or output data in the 24 hours ending 05/13/12 1114 Filed Weights   05/12/12 1834  Weight: 114.4 kg (252 lb 3.3 oz)    Exam:   General:  Well nourished NAD  Cardiovascular: RRR No MGR trace to 1+LE  edema  Respiratory: normal effort BS slightly diminished. No rhonchi no wheeze  Abdomen: obese, +BS throughout, non-tender to palpation  Musculoskeletal: Left 3rd toe slightly pink. No heat. Non tender. Otherwise joints without erythema/swelling  Skin: lateral aspect of left leg above ankle large ulcer like wound. Tissue with increased erythema in periphery and pale center. Moderate amount clear drainage. Small blisters developing left great toe   Data Reviewed: Basic Metabolic Panel:  Recent Labs Lab 05/12/12 1443 05/13/12 0506  NA 136 139  K 4.2 3.9  CL 99 102  CO2 29 28  GLUCOSE 131* 109*  BUN 17 18  CREATININE 1.03 1.15  CALCIUM 9.8 9.4   Liver Function Tests: No results found for this basename: AST, ALT, ALKPHOS, BILITOT, PROT, ALBUMIN,  in the last 168 hours No results found for this basename: LIPASE, AMYLASE,  in the last 168 hours No results found for this basename: AMMONIA,  in the last 168 hours CBC:  Recent Labs Lab 05/12/12 1443  WBC 5.9  NEUTROABS 4.3  HGB 13.5  HCT 39.7  MCV 94.7  PLT 140*   Cardiac Enzymes: No results found for this basename: CKTOTAL, CKMB, CKMBINDEX, TROPONINI,  in the last 168 hours BNP (last 3 results) No results found for this basename: PROBNP,  in the last 8760 hours CBG:  Recent Labs Lab 05/12/12 2058 05/13/12 0757  GLUCAP 174* 107*    No results found for this or any previous visit (from the past 240 hour(s)).  Studies: No results found.  Scheduled Meds: . allopurinol  300 mg Oral Daily  . aspirin EC  81 mg Oral Daily  . colchicine  0.6 mg Oral Daily  . enoxaparin (LOVENOX) injection  40 mg Subcutaneous Q24H  . finasteride  5 mg Oral Daily  . furosemide  40 mg Oral Daily  . glipiZIDE  2.5 mg Oral q morning - 10a  . levothyroxine  200 mcg Oral QAC breakfast  . metFORMIN  1,000 mg Oral Q breakfast  . metFORMIN  500 mg Oral Q supper  . pantoprazole  40 mg Oral Daily  . piperacillin-tazobactam (ZOSYN)  IV   3.375 g Intravenous Q8H  . senna  2 tablet Oral QHS  . traZODone  25 mg Oral QHS  . vancomycin  1,000 mg Intravenous Q12H   Continuous Infusions:   Time spent: 35 minutes  [facility] M  [facility]  If 7PM-7AM, please contact night-coverage at [email or web address], password [facility] 05/13/2012, 11:14 AM  LOS: 1 day   Attending note  Patient interviewed and examined independently. Agree with above. Still with quite a bit of swelling. Will change Lasix to IV twice a day. Encouraged patient to keep legs elevated. When I entered, he did not have them elevated. Monitor electrolytes. Wound care. Continue IV antibiotics for now. Patient's main complaint is pain medication. He has neuropathy pain and apparently takes Neurontin as needed at home. This is not optimal for neuropathic pain management. Will schedule his Neurontin 3 times a day and will likely need to increase as an outpatient.  Crista Curb, M.D.

## 2012-05-14 DIAGNOSIS — I4891 Unspecified atrial fibrillation: Secondary | ICD-10-CM

## 2012-05-14 LAB — CBC
MCH: 31.8 pg (ref 26.0–34.0)
MCV: 95.2 fL (ref 78.0–100.0)
Platelets: 151 10*3/uL (ref 150–400)
RBC: 4.21 MIL/uL — ABNORMAL LOW (ref 4.22–5.81)
RDW: 13.6 % (ref 11.5–15.5)

## 2012-05-14 LAB — GLUCOSE, CAPILLARY

## 2012-05-14 MED ORDER — FUROSEMIDE 20 MG PO TABS: 20.0000 mg | ORAL_TABLET | Freq: Every day | ORAL | Status: DC

## 2012-05-14 MED ORDER — WARFARIN SODIUM 7.5 MG PO TABS: 7.5000 mg | ORAL_TABLET | Freq: Once | ORAL | Status: DC

## 2012-05-14 MED ORDER — LEVOFLOXACIN 750 MG PO TABS: 750.0000 mg | ORAL_TABLET | Freq: Every day | ORAL | Status: AC

## 2012-05-14 NOTE — Discharge Summary (Signed)
Physician Discharge Summary  Tony [name] ION:[id number] DOB: 12-01-1945 DOA: 05/12/2012  PCP: [facility], MD  Admit date: 05/12/2012 Discharge date: 05/14/2012  Time spent: 35 minutes  Recommendations for Outpatient Follow-up:  1. [facility]  Discharge Diagnoses:  Principal Problem:   Cellulitis of left lower extremity Active Problems:   Diabetic neuropathy   A-fib   BPH (benign prostatic hyperplasia)   Hypothyroidism   Diabetes type 2, controlled   COPD (chronic obstructive pulmonary disease)   Supratherapeutic INR   Venous stasis ulcer of left leg   Discharge Condition: stable   Diet recommendation: carb modified   Filed Weights   05/12/12 1834  Weight: 114.4 kg (252 lb 3.3 oz)    History of present illness:  Tony [name] is a 67 y.o. male with pmhx that includes, afib on coumadin, DM, neuropathy, PTSD, COPD, recurrent cellulitis gout presented to ED on 05/12/12 with cc lower extremity infection. Of note patient discharged 04/02/12 after hospitalization for same. He reported that he had been doing well and area was healing. He reported dressing becomes saturated daily with clear fluid. He reported this to [place] and SNAP kit sent to him for dressing changes. He had not started this dressing. He reported 3 days prior "blisters" developed on lateral aspect of left ankle. These blisters quickly burst and drained clear fluid. These blisters continued to develop and burst.. He decided to come to ED as symptoms persisted. He reported chronic pain in LE due to diabetic neuropathy and this wound intensifies pain. Described the pain as stinging and pins and needles. Pain  rated 6/10 at worst. He stated movement makes worse and nothing makes better. He denied n/v/diarrhea. He denied fever, chills, anorexia. In the ED he was afebrile, with normal white count. He was non-toxic appearing. Symptoms came on gradually  persisted and worsened.  Arterial dopplers, ABIs show no significant disease during last hospitalization.    Hospital Course:  Cellulitis of left lower extremity: recurrence.  Pt admitted to medical floor. Provided with IV antibiotics. Received vanc and zosyn for 2 days.  Max temp 99.9 during hospitalization. Pt remained non-toxic appearing. Will be given Levaquin at discharge. Also given lasix for edema with fair improvement. Will continue at discharge. Will follow up with PCP at [facility] Active Problems:  Diabetic neuropathy:  Remained stable at baseline.  A-fib: coumadin per pharmacy. INR 1.9 at discharge. To continue home coumadin regimen and have INR monitored as he did pre admission  .  Hypothyroidism: TSH 0.48 1 month ago. Continue home synthroid   Diabetes type 2, controlled: on metformin and glipizide. CBG range 107-174. Carb modified diet. A1c 6.3.   COPD (chronic obstructive pulmonary disease): remained stable at baseline.  Supratherapeutic INR: Resolved. Hx afib.   Gout: stable. No s/sx flare. Continue allopurinol   BPH: stable at baseline. Continue home med        Procedures:  none  Consultations:  none  Discharge Exam: Filed Vitals:   05/13/12 0836 05/13/12 1508 05/13/12 2153 05/14/12 0704  BP:  110/60 126/55 102/60  Pulse:  52 51 52  Temp:  98.4 F (36.9 C) 99 F (37.2 C) 98.1 F (36.7 C)  TempSrc:  Oral Oral Oral  Resp:  20 19 18   Height:      Weight:      SpO2: 98% 97% 98% 96%    General: awake obese NAD Cardiovascular: irregularly irregular No MGR  Respiratory:  normal effort BS clear no wheeze/rhonchi  Discharge Instructions  Discharge Orders   Future Orders Complete By Expires     Diet - low sodium heart healthy  As directed     Increase activity slowly  As directed         Medication List    TAKE these medications       albuterol 108 (90 BASE) MCG/ACT inhaler  Commonly known as:  PROVENTIL HFA;VENTOLIN HFA  Inhale 2 puffs into the lungs every 6 (six) hours  as needed. Shortness of breath     allopurinol 300 MG tablet  Commonly known as:  ZYLOPRIM  Take 300 mg by mouth daily.     aspirin EC 81 MG tablet  Take 81 mg by mouth daily.     colchicine 0.6 MG tablet  Take 0.6 mg by mouth daily.     finasteride 5 MG tablet  Commonly known as:  PROSCAR  Take 5 mg by mouth daily.     furosemide 20 MG tablet  Commonly known as:  LASIX  Take 1 tablet (20 mg total) by mouth daily.     gabapentin 400 MG capsule  Commonly known as:  NEURONTIN  Take 400 mg by mouth 3 (three) times daily as needed (Pain).     glipiZIDE 5 MG 24 hr tablet  Commonly known as:  GLUCOTROL XL  Take 2.5 mg by mouth every morning.     levofloxacin 750 MG tablet  Commonly known as:  LEVAQUIN  Take 1 tablet (750 mg total) by mouth daily.     levothyroxine 100 MCG tablet  Commonly known as:  SYNTHROID, LEVOTHROID  Take 200 mcg by mouth every morning.     metFORMIN 1000 MG tablet  Commonly known as:  GLUCOPHAGE  Take 1,000 mg by mouth daily. Takes in the morning     metFORMIN 500 MG tablet  Commonly known as:  GLUCOPHAGE  Take 500 mg by mouth daily. Takes in the evening.     omeprazole 20 MG capsule  Commonly known as:  PRILOSEC  Take 20 mg by mouth at bedtime.     oxyCODONE 5 MG immediate release tablet  Commonly known as:  Oxy IR/ROXICODONE  Take 5 mg by mouth every 4 (four) hours as needed for pain.     senna 8.6 MG tablet  Commonly known as:  SENOKOT  Take 2-3 tablets by mouth at bedtime. Takes 2 tablets in the morning and 3 at bedtime. For constipation     traZODone 50 MG tablet  Commonly known as:  DESYREL  Take 25 mg by mouth at bedtime.     warfarin 5 MG tablet  Commonly known as:  COUMADIN  Take 7.5 mg by mouth daily.           Follow-up Information   Follow up with [facility], MD.       The results of significant diagnostics from this hospitalization (including imaging, microbiology, ancillary and laboratory) are  listed below for reference.    Significant Diagnostic Studies: No results found.      Labs: Basic Metabolic Panel:  Recent Labs Lab 05/12/12 1443 05/13/12 0506  NA 136 139  K 4.2 3.9  CL 99 102  CO2 29 28  GLUCOSE 131* 109*  BUN 17 18  CREATININE 1.03 1.15  CALCIUM 9.8 9.4       CBC:  Recent Labs Lab 05/12/12 1443 05/14/12 0439  WBC 5.9 5.3  NEUTROABS 4.3  --  HGB 13.5 13.4  HCT 39.7 40.1  MCV 94.7 95.2  PLT 140* 151     CBG:  Recent Labs Lab 05/13/12 0757 05/13/12 1209 05/13/12 1646 05/13/12 2156 05/14/12 0740  GLUCAP 107* 138* 112* 150* 131*       Signed:  BLACK,KAREN M  [facility] 05/14/2012, 10:01 AM Attending: Patient seen and examined. This is stable for discharge with oral antibiotics for a further one week. He will need wound care and he has home health in place. He will follow with the [place].

## 2012-05-14 NOTE — Progress Notes (Signed)
Patient received discharge instructions along with follow up appointments and prescriptions. Patient verbalized understanding of all instructions. Patient was escorted by staff to vehicle. Patient discharged home in stable condition.

## 2012-05-14 NOTE — Progress Notes (Deleted)
No IV access. Per sitter in room, patient rolled over on IV and it became occluded. Pt will have PICC insertion today. No peripheral IV attempt made, verified by [facility].

## 2012-05-14 NOTE — Progress Notes (Signed)
ANTICOAGULATION CONSULT NOTE  Pharmacy Consult for Coumadin Indication: atrial fibrillation  Allergies  Allergen Reactions  . Terazosin     Patient is not familiar with this medication as an allergy    Patient Measurements: Height: 6' (182.9 cm) Weight: 252 lb 3.3 oz (114.4 kg) IBW/kg (Calculated) : 77.6  Vital Signs: Temp: 98.1 F (36.7 C) (03/27 0704) Temp src: Oral (03/27 0704) BP: 102/60 mmHg (03/27 0704) Pulse Rate: 52 (03/27 0704)  Labs:  Recent Labs  05/12/12 1443 05/13/12 0506 05/14/12 0439  HGB 13.5  --  13.4  HCT 39.7  --  40.1  PLT 140*  --  151  LABPROT 30.6* 27.5* 21.1*  INR 3.14* 2.72* 1.90*  CREATININE 1.03 1.15  --     Estimated Creatinine Clearance: 82.5 ml/min (by C-G formula based on Cr of 1.15).   Medical History: Past Medical History  Diagnosis Date  . Neuropathy   . COPD (chronic obstructive pulmonary disease)   . Diabetes mellitus   . Spinal stenosis   . Atrial fibrillation   . PTSD (post-traumatic stress disorder)     Medications:  Scheduled:  . allopurinol  300 mg Oral Daily  . aspirin EC  81 mg Oral Daily  . colchicine  0.6 mg Oral Daily  . finasteride  5 mg Oral Daily  . furosemide  40 mg Intravenous BID  . gabapentin  400 mg Oral TID  . glipiZIDE  2.5 mg Oral q morning - 10a  . levothyroxine  200 mcg Oral QAC breakfast  . metFORMIN  1,000 mg Oral Q breakfast  . metFORMIN  500 mg Oral Q supper  . pantoprazole  40 mg Oral Daily  . piperacillin-tazobactam (ZOSYN)  IV  3.375 g Intravenous Q8H  . senna  2 tablet Oral QHS  . traZODone  25 mg Oral QHS  . vancomycin  1,000 mg Intravenous Q12H  . [COMPLETED] warfarin  5 mg Oral ONCE-1800  . Warfarin - Pharmacist Dosing Inpatient   Does not apply Q24H  . [DISCONTINUED] enoxaparin (LOVENOX) injection  40 mg Subcutaneous Q24H  . [DISCONTINUED] furosemide  40 mg Oral Daily    Assessment: 67 yo M on chronic warfarin 7.5mg  daily for Afib.  INR was supra-therapeutic & warfarin  restarted yesterday.  Slightly below goal range today. No bleeding noted.   Goal of Therapy:  INR 2-3   Plan:  Coumadin 7.5mg  po x1 today  Daily INR  Elson Clan 05/14/2012,8:19 AM

## 2012-12-12 ENCOUNTER — Emergency Department (HOSPITAL_COMMUNITY): Payer: Medicare Other

## 2012-12-12 ENCOUNTER — Encounter (HOSPITAL_COMMUNITY): Payer: Self-pay | Admitting: Emergency Medicine

## 2012-12-12 ENCOUNTER — Inpatient Hospital Stay (HOSPITAL_COMMUNITY)
Admission: EM | Admit: 2012-12-12 | Discharge: 2012-12-15 | DRG: 638 | Disposition: A | Discharge status: Home Health | Payer: Medicare Other | Attending: Internal Medicine | Admitting: Internal Medicine

## 2012-12-12 DIAGNOSIS — L988 Other specified disorders of the skin and subcutaneous tissue: Secondary | ICD-10-CM

## 2012-12-12 DIAGNOSIS — Z7901 Long term (current) use of anticoagulants: Secondary | ICD-10-CM

## 2012-12-12 DIAGNOSIS — L03116 Cellulitis of left lower limb: Secondary | ICD-10-CM

## 2012-12-12 DIAGNOSIS — Z8249 Family history of ischemic heart disease and other diseases of the circulatory system: Secondary | ICD-10-CM

## 2012-12-12 DIAGNOSIS — F4310 Post-traumatic stress disorder, unspecified: Secondary | ICD-10-CM | POA: Diagnosis present

## 2012-12-12 DIAGNOSIS — E11622 Type 2 diabetes mellitus with other skin ulcer: Secondary | ICD-10-CM | POA: Diagnosis present

## 2012-12-12 DIAGNOSIS — E1142 Type 2 diabetes mellitus with diabetic polyneuropathy: Secondary | ICD-10-CM | POA: Diagnosis present

## 2012-12-12 DIAGNOSIS — J449 Chronic obstructive pulmonary disease, unspecified: Secondary | ICD-10-CM | POA: Diagnosis present

## 2012-12-12 DIAGNOSIS — Z8051 Family history of malignant neoplasm of kidney: Secondary | ICD-10-CM

## 2012-12-12 DIAGNOSIS — I482 Chronic atrial fibrillation, unspecified: Secondary | ICD-10-CM | POA: Diagnosis present

## 2012-12-12 DIAGNOSIS — I4891 Unspecified atrial fibrillation: Secondary | ICD-10-CM | POA: Diagnosis present

## 2012-12-12 DIAGNOSIS — E1149 Type 2 diabetes mellitus with other diabetic neurological complication: Secondary | ICD-10-CM | POA: Diagnosis present

## 2012-12-12 DIAGNOSIS — Z8042 Family history of malignant neoplasm of prostate: Secondary | ICD-10-CM

## 2012-12-12 DIAGNOSIS — I83009 Varicose veins of unspecified lower extremity with ulcer of unspecified site: Secondary | ICD-10-CM

## 2012-12-12 DIAGNOSIS — L97929 Non-pressure chronic ulcer of unspecified part of left lower leg with unspecified severity: Secondary | ICD-10-CM

## 2012-12-12 DIAGNOSIS — L02419 Cutaneous abscess of limb, unspecified: Secondary | ICD-10-CM

## 2012-12-12 DIAGNOSIS — E11628 Type 2 diabetes mellitus with other skin complications: Secondary | ICD-10-CM | POA: Diagnosis present

## 2012-12-12 DIAGNOSIS — M4800 Spinal stenosis, site unspecified: Secondary | ICD-10-CM | POA: Diagnosis present

## 2012-12-12 DIAGNOSIS — E039 Hypothyroidism, unspecified: Secondary | ICD-10-CM

## 2012-12-12 DIAGNOSIS — E1169 Type 2 diabetes mellitus with other specified complication: Principal | ICD-10-CM

## 2012-12-12 DIAGNOSIS — J4489 Other specified chronic obstructive pulmonary disease: Secondary | ICD-10-CM | POA: Diagnosis present

## 2012-12-12 DIAGNOSIS — E119 Type 2 diabetes mellitus without complications: Secondary | ICD-10-CM

## 2012-12-12 DIAGNOSIS — IMO0001 Reserved for inherently not codable concepts without codable children: Secondary | ICD-10-CM

## 2012-12-12 DIAGNOSIS — Z79899 Other long term (current) drug therapy: Secondary | ICD-10-CM

## 2012-12-12 DIAGNOSIS — L97909 Non-pressure chronic ulcer of unspecified part of unspecified lower leg with unspecified severity: Secondary | ICD-10-CM

## 2012-12-12 DIAGNOSIS — I872 Venous insufficiency (chronic) (peripheral): Secondary | ICD-10-CM | POA: Diagnosis present

## 2012-12-12 LAB — COMPREHENSIVE METABOLIC PANEL
BUN: 19 mg/dL (ref 6–23)
CO2: 29 mEq/L (ref 19–32)
Chloride: 98 mEq/L (ref 96–112)
Creatinine, Ser: 1.23 mg/dL (ref 0.50–1.35)
GFR calc Af Amer: 68 mL/min — ABNORMAL LOW (ref >90)
GFR calc non Af Amer: 59 mL/min — ABNORMAL LOW (ref >90)
Total Bilirubin: 0.7 mg/dL (ref 0.3–1.2)

## 2012-12-12 LAB — CBC WITH DIFFERENTIAL/PLATELET
HCT: 42.1 % (ref 39.0–52.0)
Hemoglobin: 14.7 g/dL (ref 13.0–17.0)
Lymphocytes Relative: 22 % (ref 12–46)
MCHC: 34.9 g/dL (ref 30.0–36.0)
Monocytes Absolute: 0.5 10*3/uL (ref 0.1–1.0)
Monocytes Relative: 8 % (ref 3–12)
Neutro Abs: 4.2 10*3/uL (ref 1.7–7.7)

## 2012-12-12 LAB — PROTIME-INR
INR: 1.24 (ref 0.00–1.49)
Prothrombin Time: 15.3 seconds — ABNORMAL HIGH (ref 11.6–15.2)

## 2012-12-12 MED ORDER — ONDANSETRON HCL 4 MG/2ML IJ SOLN: 4.0000 mg | Freq: Four times a day (QID) | INTRAMUSCULAR | Status: DC | PRN

## 2012-12-12 MED ORDER — TRAZODONE HCL 50 MG PO TABS
25.0000 mg | ORAL_TABLET | Freq: Every day | ORAL | Status: DC
  Administered 2012-12-13 – 2012-12-14 (×3): 25 mg via ORAL
  Filled 2012-12-12 (×3): qty 1

## 2012-12-12 MED ORDER — VANCOMYCIN HCL IN DEXTROSE 1-5 GM/200ML-% IV SOLN
1000.0000 mg | Freq: Once | INTRAVENOUS | Status: AC
  Administered 2012-12-12: 1000 mg via INTRAVENOUS
  Filled 2012-12-12: qty 200

## 2012-12-12 MED ORDER — FUROSEMIDE 20 MG PO TABS
20.0000 mg | ORAL_TABLET | Freq: Every day | ORAL | Status: DC
  Administered 2012-12-13 – 2012-12-15 (×3): 20 mg via ORAL
  Filled 2012-12-12 (×3): qty 1

## 2012-12-12 MED ORDER — ALLOPURINOL 300 MG PO TABS: 300.0000 mg | ORAL_TABLET | Freq: Every day | ORAL | Status: DC

## 2012-12-12 MED ORDER — GABAPENTIN 400 MG PO CAPS
400.0000 mg | ORAL_CAPSULE | Freq: Three times a day (TID) | ORAL | Status: DC | PRN
  Administered 2012-12-13 – 2012-12-14 (×2): 400 mg via ORAL
  Filled 2012-12-12 (×3): qty 1

## 2012-12-12 MED ORDER — LEVOTHYROXINE SODIUM 100 MCG PO TABS
200.0000 ug | ORAL_TABLET | Freq: Every day | ORAL | Status: DC
  Administered 2012-12-13 – 2012-12-15 (×3): 200 ug via ORAL
  Filled 2012-12-12 (×3): qty 2

## 2012-12-12 MED ORDER — ONDANSETRON HCL 4 MG PO TABS
4.0000 mg | ORAL_TABLET | Freq: Four times a day (QID) | ORAL | Status: DC | PRN
  Administered 2012-12-14: 4 mg via ORAL
  Filled 2012-12-12: qty 1

## 2012-12-12 MED ORDER — ALBUTEROL SULFATE HFA 108 (90 BASE) MCG/ACT IN AERS: 2.0000 | INHALATION_SPRAY | Freq: Four times a day (QID) | RESPIRATORY_TRACT | Status: DC | PRN

## 2012-12-12 MED ORDER — DABIGATRAN ETEXILATE MESYLATE 150 MG PO CAPS
150.0000 mg | ORAL_CAPSULE | Freq: Two times a day (BID) | ORAL | Status: DC
  Filled 2012-12-12 (×4): qty 1

## 2012-12-12 MED ORDER — PANTOPRAZOLE SODIUM 40 MG PO TBEC: 40.0000 mg | DELAYED_RELEASE_TABLET | Freq: Every day | ORAL | Status: DC

## 2012-12-12 MED ORDER — FINASTERIDE 5 MG PO TABS
5.0000 mg | ORAL_TABLET | Freq: Every day | ORAL | Status: DC
  Administered 2012-12-13 – 2012-12-15 (×3): 5 mg via ORAL
  Filled 2012-12-12 (×4): qty 1

## 2012-12-12 MED ORDER — SODIUM CHLORIDE 0.9 % IV BOLUS (SEPSIS)
1000.0000 mL | Freq: Once | INTRAVENOUS | Status: AC
  Administered 2012-12-12: 1000 mL via INTRAVENOUS

## 2012-12-12 MED ORDER — ATORVASTATIN CALCIUM 40 MG PO TABS: 40.0000 mg | ORAL_TABLET | Freq: Every day | ORAL | Status: DC

## 2012-12-12 MED ORDER — INSULIN ASPART 100 UNIT/ML ~~LOC~~ SOLN
0.0000 [IU] | Freq: Three times a day (TID) | SUBCUTANEOUS | Status: DC
  Administered 2012-12-13 (×2): 1 [IU] via SUBCUTANEOUS
  Administered 2012-12-13 – 2012-12-14 (×3): 2 [IU] via SUBCUTANEOUS
  Administered 2012-12-14 – 2012-12-15 (×2): 1 [IU] via SUBCUTANEOUS
  Administered 2012-12-15: 2 [IU] via SUBCUTANEOUS

## 2012-12-12 MED ORDER — SENNOSIDES 8.6 MG PO TABS
2.0000 | ORAL_TABLET | Freq: Two times a day (BID) | ORAL | Status: DC
  Filled 2012-12-12 (×4): qty 2

## 2012-12-12 MED ORDER — SODIUM CHLORIDE 0.9 % IV SOLN
INTRAVENOUS | Status: DC
  Administered 2012-12-13 – 2012-12-14 (×4): via INTRAVENOUS

## 2012-12-12 MED ORDER — COLCHICINE 0.6 MG PO TABS
0.6000 mg | ORAL_TABLET | Freq: Every day | ORAL | Status: DC
  Administered 2012-12-13 – 2012-12-15 (×3): 0.6 mg via ORAL
  Filled 2012-12-12 (×3): qty 1

## 2012-12-12 MED ORDER — PIPERACILLIN-TAZOBACTAM 3.375 G IVPB
3.3750 g | Freq: Once | INTRAVENOUS | Status: AC
  Administered 2012-12-12: 3.375 g via INTRAVENOUS
  Filled 2012-12-12: qty 50

## 2012-12-12 MED ORDER — ASPIRIN EC 81 MG PO TBEC
81.0000 mg | DELAYED_RELEASE_TABLET | Freq: Every day | ORAL | Status: DC
  Administered 2012-12-13 – 2012-12-15 (×3): 81 mg via ORAL
  Filled 2012-12-12 (×3): qty 1

## 2012-12-12 MED ORDER — OXYCODONE HCL 5 MG PO TABS
10.0000 mg | ORAL_TABLET | Freq: Three times a day (TID) | ORAL | Status: DC | PRN
  Administered 2012-12-13 – 2012-12-15 (×7): 10 mg via ORAL
  Filled 2012-12-12 (×8): qty 2

## 2012-12-12 NOTE — ED Notes (Signed)
Pt c/o left lower leg oozing fluid. Pt has had this problem before. Pt does have a registered service animal with him.

## 2012-12-12 NOTE — H&P (Signed)
PCP:   [facility], [place]   Chief Complaint:  Left leg ulcer  HPI: 67 year old male with a history of diabetes mellitus, chronic left leg ulcer, COPD, diabetic neuropathy, PTSD atrial fibrillation on chronic anticoagulation with Pradaxa, who came to the hospital after patient developed worsening of left leg ulcer this morning. Patient denies any fever, no chills, denies any injury to the leg. Patient says that he has had this ulcer in March at that time arterial Dopplers were done which did not show significant peripheral arterial disease. Patient has history of blisters which developed acutely and burst and drained clear fluid and is have been a recurrent problem. Patient denies any pain in the leg though he feels uncomfortable. He denies chest pain no shortness of breath no nausea vomiting or diarrhea. Patient has a history of diabetes mellitus and he feels that his blood sugars are well controlled. X-ray of the tibia-fibula done in the ED does not show any acute osseous involvement.  Allergies:   Allergies  Allergen Reactions  . Terazosin     Patient is not familiar with this medication as an allergy      Past Medical History  Diagnosis Date  . Neuropathy   . COPD (chronic obstructive pulmonary disease)   . Diabetes mellitus   . Spinal stenosis   . Atrial fibrillation   . PTSD (post-traumatic stress disorder)     Past Surgical History  Procedure Laterality Date  . Cholecystectomy      Prior to Admission medications   Medication Sig Start Date End Date Taking? Authorizing Provider  albuterol (PROVENTIL HFA;VENTOLIN HFA) 108 (90 BASE) MCG/ACT inhaler Inhale 2 puffs into the lungs every 6 (six) hours as needed. Shortness of breath    Yes Historical Provider, MD  allopurinol (ZYLOPRIM) 300 MG tablet Take 300 mg by mouth daily.     Yes Historical Provider, MD  aspirin EC 81 MG tablet Take 81 mg by mouth daily.     Yes Historical Provider, MD  atorvastatin (LIPITOR) 80 MG  tablet Take 40 mg by mouth daily.   Yes Historical Provider, MD  dabigatran (PRADAXA) 150 MG CAPS capsule Take 150 mg by mouth every 12 (twelve) hours.   Yes Historical Provider, MD  furosemide (LASIX) 20 MG tablet Take 1 tablet (20 mg total) by mouth daily. 05/14/12  Yes Nimish Normajean Glasgow, MD  gabapentin (NEURONTIN) 400 MG capsule Take 400 mg by mouth 3 (three) times daily as needed (Pain). 04/04/12  Yes Hollice Espy, MD  levothyroxine (SYNTHROID, LEVOTHROID) 100 MCG tablet Take 200 mcg by mouth every morning.    Yes Historical Provider, MD  metFORMIN (GLUCOPHAGE) 500 MG tablet Take 500 mg by mouth 2 (two) times daily.    Yes Historical Provider, MD  omeprazole (PRILOSEC) 20 MG capsule Take 20 mg by mouth at bedtime.    Yes Historical Provider, MD  oxyCODONE (OXY IR/ROXICODONE) 5 MG immediate release tablet Take 10 mg by mouth every 8 (eight) hours as needed for pain.    Yes Historical Provider, MD  senna (SENOKOT) 8.6 MG tablet Take 2 tablets by mouth 2 (two) times daily. Takes 2 tablets in the morning and 3 at bedtime. For constipation   Yes Historical Provider, MD  traZODone (DESYREL) 50 MG tablet Take 25 mg by mouth at bedtime.   Yes Historical Provider, MD  colchicine 0.6 MG tablet Take 0.6 mg by mouth daily.     Historical Provider, MD  finasteride (PROSCAR) 5 MG  tablet Take 5 mg by mouth daily.      Historical Provider, MD  glipiZIDE (GLUCOTROL XL) 5 MG 24 hr tablet Take 2.5 mg by mouth every morning.     Historical Provider, MD  warfarin (COUMADIN) 5 MG tablet Take 7.5 mg by mouth daily.    Historical Provider, MD    Social History:  reports that he has never smoked. He does not have any smokeless tobacco history on file. He reports that he does not drink alcohol or use illicit drugs.  Family History  Problem Relation Age of Onset  . Heart disease Mother   . Prostate cancer Brother   . Kidney cancer Brother      All the positives are listed in BOLD  Review of Systems:  HEENT:  Headache, blurred vision, runny nose, sore throat Neck: Hypothyroidism, hyperthyroidism,,lymphadenopathy Chest : Shortness of breath, history of COPD, Asthma Heart : Chest pain, history of coronary arterey disease GI:  Nausea, vomiting, diarrhea, constipation, GERD GU: Dysuria, urgency, frequency of urination, hematuria Neuro: Stroke, seizures, syncope Psych: Depression, anxiety, hallucinations   Physical Exam: Blood pressure 120/63, pulse 53, temperature 98.1 F (36.7 C), temperature source Oral, resp. rate 20, height 6' (1.829 m), weight 100.88 kg (222 lb 6.4 oz), SpO2 100.00%. Constitutional:   Patient is a well-developed and well-nourished male in no acute distress and cooperative with exam. Head: Normocephalic and atraumatic Mouth: Mucus membranes moist Eyes: PERRL, EOMI, conjunctivae normal Neck: Supple, No Thyromegaly Cardiovascular: RRR, S1 normal, S2 normal Pulmonary/Chest: CTAB, no wheezes, rales, or rhonchi Abdominal: Soft. Non-tender, non-distended, bowel sounds are normal, no masses, organomegaly, or guarding present.  Neurological: A&O x3, Strenght is normal and symmetric bilaterally, cranial nerve II-XII are grossly intact, no focal motor deficit, sensory intact to light touch bilaterally.  Extremities : A large bulla and an open ulcer noted in the lateral aspect of the left leg. There is surrounding erythema around the ulcer, the skin is warm to touch and clear fluid is draining from the opening in the bulla.   Labs on Admission:  Results for orders placed during the hospital encounter of 12/12/12 (from the past 48 hour(s))  CBC WITH DIFFERENTIAL     Status: Abnormal   Collection Time    12/12/12  8:59 PM      Result Value Range   WBC 6.2  4.0 - 10.5 K/uL   RBC 4.37  4.22 - 5.81 MIL/uL   Hemoglobin 14.7  13.0 - 17.0 g/dL   HCT 40.9  81.1 - 91.4 %   MCV 96.3  78.0 - 100.0 fL   MCH 33.6  26.0 - 34.0 pg   MCHC 34.9  30.0 - 36.0 g/dL   RDW 78.2  95.6 - 21.3 %    Platelets 133 (*) 150 - 400 K/uL   Neutrophils Relative % 67  43 - 77 %   Neutro Abs 4.2  1.7 - 7.7 K/uL   Lymphocytes Relative 22  12 - 46 %   Lymphs Abs 1.4  0.7 - 4.0 K/uL   Monocytes Relative 8  3 - 12 %   Monocytes Absolute 0.5  0.1 - 1.0 K/uL   Eosinophils Relative 2  0 - 5 %   Eosinophils Absolute 0.1  0.0 - 0.7 K/uL   Basophils Relative 0  0 - 1 %   Basophils Absolute 0.0  0.0 - 0.1 K/uL  COMPREHENSIVE METABOLIC PANEL     Status: Abnormal   Collection Time    12/12/12  8:59 PM      Result Value Range   Sodium 136  135 - 145 mEq/L   Potassium 4.0  3.5 - 5.1 mEq/L   Chloride 98  96 - 112 mEq/L   CO2 29  19 - 32 mEq/L   Glucose, Bld 140 (*) 70 - 99 mg/dL   BUN 19  6 - 23 mg/dL   Creatinine, Ser 6.96  0.50 - 1.35 mg/dL   Calcium 9.9  8.4 - 29.5 mg/dL   Total Protein 7.2  6.0 - 8.3 g/dL   Albumin 4.4  3.5 - 5.2 g/dL   AST 17  0 - 37 U/L   ALT 17  0 - 53 U/L   Alkaline Phosphatase 76  39 - 117 U/L   Total Bilirubin 0.7  0.3 - 1.2 mg/dL   GFR calc non Af Amer 59 (*) >90 mL/min   GFR calc Af Amer 68 (*) >90 mL/min   Comment: (NOTE)     The eGFR has been calculated using the CKD EPI equation.     This calculation has not been validated in all clinical situations.     eGFR's persistently <90 mL/min signify possible Chronic Kidney     Disease.  PROTIME-INR     Status: Abnormal   Collection Time    12/12/12  8:59 PM      Result Value Range   Prothrombin Time 15.3 (*) 11.6 - 15.2 seconds   INR 1.24  0.00 - 1.49    Radiological Exams on Admission: Dg Tibia/fibula Left  12/12/2012   CLINICAL DATA:  Leg pain.  Draining wound.  EXAM: LEFT TIBIA AND FIBULA - 2 VIEW  COMPARISON:  04/02/2012.  FINDINGS: There is infiltration of subcutaneous tissues along with the leg, more prominent laterally and distally. Tibia and fibula intact. There is no osteolysis. No radiopaque foreign body or gas within the soft tissues. Wound appears present with skin irregularity evident on the lateral  view in the distal posterior leg.  IMPRESSION: No acute osseous abnormality. Infiltration of the soft tissues consistent with edema, possibly representing cellulitis. Wound or ulceration in the posterior distal leg.   Electronically Signed   By: Andreas Newport M.D.   On: 12/12/2012 22:31    Assessment/Plan Active Problems:   A-fib   Hypothyroidism   Diabetes type 2, controlled   Diabetic ulcer of lower extremity   Bullosis diabeticorum  67 year old male with a history of blisters left leg ulcer now admitted with possibly infected ulcer of the left leg. The bulla on the left leg appears to be bullosis diabeticorum, but he does have surrounding erythema and warmth suggesting infection. We'll start him on vancomycin and Zosyn to cover both MRSA as well as pseudomonas as patient does have a history of diabetes mellitus. X-ray of the left leg does not reveal any acute osseous development. And the development of the bulla and the ulcer in 1 day suggests acute infection.  As per uptodate, bullous diabeticorum is a term used to describe the abrupt development of noninflammatory, tense, subepidermal bullae in patients with diabetes in sites of otherwise normal appearing skinlower legs and may be up to several centimeters in diameter. The bullae spontaneously resolve over the course of a few weeks.   We'll also obtain wound care consultation in the morning. Once patient starts to improve he can be discharged on by mouth antibiotics. Will continue patient on anticoagulation with pradaxa for atrial fibrillation, patient does have a history of bradycardia so  he was taken off the metoprolol.   Code status: Patient is full code  Family discussion: No family bedside, discussed in detail with patient   Time Spent on Admission: 75 min  Adolph Clutter S [facility] Pager: [phone] 12/12/2012, 10:50 PM  If 7PM-7AM, please contact night-coverage  [email or web address]  Password [email or web address]

## 2012-12-12 NOTE — ED Provider Notes (Signed)
CSN: [id number]     Arrival date & time 12/12/12  2003 History   First MD Initiated Contact with Patient 12/12/12 2033    Scribed for Meredeth Ide, MD, the patient was seen in room A328/A328-01. This chart was scribed by Lewanda Rife, ED scribe. Patient's care was started at 1:37 AM  Chief Complaint  Patient presents with  . Leg Pain   (Consider location/radiation/quality/duration/timing/severity/associated sxs/prior Treatment) The history is provided by the patient. No language interpreter was used.   HPI Comments: Tony [name] is a 67 y.o. male who presents to the Emergency Department with PMHx of DM and neuropathy complaining of constant mild left lower leg pain onset 2 days. Reports associated wound over affected leg is "oozing fluid", redness, and blistering (which started today). Reports wound on left lower leg is has been worsening. Denies any aggravating or alleviating factors. Denies associated fever, chest pain, shortness of breath, weakness, and (new) numbness. Reports using mupirocin and vaseline with no relief of symptoms. Reports hx of the same and was inpatient for 2 days for cellulitis of left lower leg 05/12/2012.    Past Medical History  Diagnosis Date  . Neuropathy   . COPD (chronic obstructive pulmonary disease)   . Diabetes mellitus   . Spinal stenosis   . Atrial fibrillation   . PTSD (post-traumatic stress disorder)    Past Surgical History  Procedure Laterality Date  . Cholecystectomy     Family History  Problem Relation Age of Onset  . Heart disease Mother   . Prostate cancer Brother   . Kidney cancer Brother    History  Substance Use Topics  . Smoking status: Never Smoker   . Smokeless tobacco: Never Used  . Alcohol Use: No    Review of Systems  Musculoskeletal: Positive for myalgias.  Skin: Positive for wound.  All other systems reviewed and are negative.   A complete 10 system review of systems was obtained and all systems are negative  except as noted in the HPI and PMHx.    Allergies  Terazosin  Home Medications   No current outpatient prescriptions on file. BP 147/82  Pulse 61  Temp(Src) 97.5 F (36.4 C) (Oral)  Resp 20  Ht 6' (1.829 m)  Wt 221 lb 9 oz (100.5 kg)  BMI 30.04 kg/m2  SpO2 100% Physical Exam  Nursing note and vitals reviewed. Constitutional: He is oriented to person, place, and time. He appears well-developed and well-nourished. No distress.  Obese   HENT:  Head: Normocephalic and atraumatic.  Mouth/Throat: Oropharynx is clear and moist. No oropharyngeal exudate.  Eyes: Conjunctivae and EOM are normal. Pupils are equal, round, and reactive to light. Right eye exhibits no discharge.  Neck: Normal range of motion. Neck supple. No tracheal deviation present.  Cardiovascular: Normal rate, regular rhythm, intact distal pulses and normal pulses.   No murmur heard. Pulses:      Dorsalis pedis pulses are 2+ on the left side.       Posterior tibial pulses are 2+ on the left side.  Pulmonary/Chest: Effort normal and breath sounds normal. No respiratory distress. He has no wheezes.  Abdominal: Soft. Bowel sounds are normal. There is no tenderness. There is no rebound and no guarding.  Musculoskeletal: Normal range of motion. He exhibits edema and tenderness.  Neurological: He is alert and oriented to person, place, and time. No cranial nerve deficit. He exhibits normal muscle tone. Coordination normal.  Skin: Skin is warm and dry. There  is erythema.  Severe venous changes bilaterally, intact DP/PT pulses  lateral left lower leg 1 by 2 cm ulceration and  3 by 6 cm bullae draining clear fluid. Mild surrounding erythema   Psychiatric: He has a normal mood and affect. His behavior is normal.    ED Course  Procedures (including critical care time) COORDINATION OF CARE:  Nursing notes reviewed. Vital signs reviewed. Initial pt interview and examination performed.   1:37 AM-Discussed work up plan with  pt at bedside, which includes CBC with diff panel, CMP, and Protime-INR . Pt agrees with plan. 1:37 AM Nursing Notes Reviewed/ Care Coordinated Applicable Imaging Reviewed  Interpretation of Laboratory Data incorporated into ED treatment Discussed results and treatment plan with pt. Pt demonstrates understanding and agrees with plan for admission.   Treatment plan initiated: Medications  furosemide (LASIX) tablet 20 mg (not administered)  gabapentin (NEURONTIN) capsule 400 mg (not administered)  oxyCODONE (Oxy IR/ROXICODONE) immediate release tablet 10 mg (10 mg Oral Given 12/13/12 0120)  traZODone (DESYREL) tablet 25 mg (25 mg Oral Given 12/13/12 0118)  albuterol (PROVENTIL HFA;VENTOLIN HFA) 108 (90 BASE) MCG/ACT inhaler 2 puff (not administered)  aspirin EC tablet 81 mg (not administered)  colchicine tablet 0.6 mg (not administered)  finasteride (PROSCAR) tablet 5 mg (not administered)  levothyroxine (SYNTHROID, LEVOTHROID) tablet 200 mcg (not administered)  senna (SENOKOT) tablet 17.2 mg ( Oral Canceled Entry 12/13/12 0127)  0.9 %  sodium chloride infusion ( Intravenous New Bag/Given 12/13/12 0119)  ondansetron (ZOFRAN) tablet 4 mg (not administered)    Or  ondansetron (ZOFRAN) injection 4 mg (not administered)  insulin aspart (novoLOG) injection 0-9 Units (not administered)  piperacillin-tazobactam (ZOSYN) IVPB 3.375 g (not administered)  allopurinol (ZYLOPRIM) tablet 300 mg (300 mg Oral Given 12/13/12 0118)  atorvastatin (LIPITOR) tablet 40 mg (40 mg Oral Given 12/13/12 0118)  pantoprazole (PROTONIX) EC tablet 40 mg (40 mg Oral Given 12/13/12 0118)  dabigatran (PRADAXA) capsule 150 mg (not administered)  MUSCLE RUB CREA ( Topical Given 12/13/12 0118)  sodium chloride 0.9 % bolus 1,000 mL (1,000 mLs Intravenous New Bag/Given 12/12/12 2120)  vancomycin (VANCOCIN) IVPB 1000 mg/200 mL premix (1,000 mg Intravenous New Bag/Given 12/12/12 2155)  piperacillin-tazobactam (ZOSYN) IVPB  3.375 g (0 g Intravenous Stopped 12/12/12 2154)     Initial diagnostic testing ordered.    Labs Review Labs Reviewed  CBC WITH DIFFERENTIAL - Abnormal; Notable for the following:    Platelets 133 (*)    All other components within normal limits  COMPREHENSIVE METABOLIC PANEL - Abnormal; Notable for the following:    Glucose, Bld 140 (*)    GFR calc non Af Amer 59 (*)    GFR calc Af Amer 68 (*)    All other components within normal limits  PROTIME-INR - Abnormal; Notable for the following:    Prothrombin Time 15.3 (*)    All other components within normal limits  CBC  COMPREHENSIVE METABOLIC PANEL   Imaging Review Dg Tibia/fibula Left  12/12/2012   CLINICAL DATA:  Leg pain.  Draining wound.  EXAM: LEFT TIBIA AND FIBULA - 2 VIEW  COMPARISON:  04/02/2012.  FINDINGS: There is infiltration of subcutaneous tissues along with the leg, more prominent laterally and distally. Tibia and fibula intact. There is no osteolysis. No radiopaque foreign body or gas within the soft tissues. Wound appears present with skin irregularity evident on the lateral view in the distal posterior leg.  IMPRESSION: No acute osseous abnormality. Infiltration of the soft tissues consistent with  edema, possibly representing cellulitis. Wound or ulceration in the posterior distal leg.   Electronically Signed   By: Andreas Newport M.D.   On: 12/12/2012 22:31    EKG Interpretation     Ventricular Rate:    PR Interval:    QRS Duration:   QT Interval:    QTC Calculation:   R Axis:     Text Interpretation:              MDM   1. Bullosis diabeticorum   2. Cellulitis of left lower extremity   3. Diabetes type 2, controlled   4. Diabetic ulcer of lower extremity   5. Hypothyroidism   6. Venous stasis ulcer, left   7. Leg ulcer, left, with unspecified severity    history of diabetes mellitus, chronic left leg ulcer, COPD, diabetic neuropathy, PTSD atrial fibrillation on chronic anticoagulation with  Pradaxa.  Worsening of L leg ulcer today with drainage of clear fluid, no fever.  Labs unremarkable. Patient empirically started on vanco and zosyn for cellulitis, hx DM and need for pseudomonas coverage. D/w Dr. Sharl Ma who believes patient has component of bullous diabetcorum.  He agrees with IV antibiotics and observation for possible superinfection.  I personally performed the services described in this documentation, which was scribed in my presence. The recorded information has been reviewed and is accurate.    Glynn Octave, MD 12/13/12 [phone]

## 2012-12-13 ENCOUNTER — Encounter (HOSPITAL_COMMUNITY): Payer: Self-pay | Admitting: *Deleted

## 2012-12-13 DIAGNOSIS — I4891 Unspecified atrial fibrillation: Secondary | ICD-10-CM

## 2012-12-13 LAB — COMPREHENSIVE METABOLIC PANEL WITH GFR
ALT: 15 U/L (ref 0–53)
AST: 17 U/L (ref 0–37)
Albumin: 3.6 g/dL (ref 3.5–5.2)
Alkaline Phosphatase: 63 U/L (ref 39–117)
BUN: 18 mg/dL (ref 6–23)
CO2: 29 meq/L (ref 19–32)
Calcium: 9.3 mg/dL (ref 8.4–10.5)
Chloride: 102 meq/L (ref 96–112)
Creatinine, Ser: 0.98 mg/dL (ref 0.50–1.35)
GFR calc Af Amer: >90 mL/min
GFR calc non Af Amer: 83 mL/min — ABNORMAL LOW
Glucose, Bld: 144 mg/dL — ABNORMAL HIGH (ref 70–99)
Potassium: 3.6 meq/L (ref 3.5–5.1)
Sodium: 140 meq/L (ref 135–145)
Total Bilirubin: 0.9 mg/dL (ref 0.3–1.2)
Total Protein: 6 g/dL (ref 6.0–8.3)

## 2012-12-13 LAB — GLUCOSE, CAPILLARY
Glucose-Capillary: 137 mg/dL — ABNORMAL HIGH (ref 70–99)
Glucose-Capillary: 188 mg/dL — ABNORMAL HIGH (ref 70–99)
Glucose-Capillary: 150 mg/dL — ABNORMAL HIGH (ref 70–99)
Glucose-Capillary: 135 mg/dL — ABNORMAL HIGH (ref 70–99)

## 2012-12-13 LAB — CBC
HCT: 38.8 % — ABNORMAL LOW (ref 39.0–52.0)
Hemoglobin: 13.3 g/dL (ref 13.0–17.0)
MCH: 33.2 pg (ref 26.0–34.0)
MCHC: 34.3 g/dL (ref 30.0–36.0)
MCV: 96.8 fL (ref 78.0–100.0)
Platelets: 113 10*3/uL — ABNORMAL LOW (ref 150–400)
RBC: 4.01 MIL/uL — ABNORMAL LOW (ref 4.22–5.81)
RDW: 13.4 % (ref 11.5–15.5)
WBC: 5 10*3/uL (ref 4.0–10.5)

## 2012-12-13 MED ORDER — ALLOPURINOL 300 MG PO TABS
300.0000 mg | ORAL_TABLET | Freq: Every day | ORAL | Status: DC
  Administered 2012-12-13 – 2012-12-14 (×3): 300 mg via ORAL
  Filled 2012-12-13 (×3): qty 1

## 2012-12-13 MED ORDER — PIPERACILLIN-TAZOBACTAM 3.375 G IVPB
3.3750 g | Freq: Once | INTRAVENOUS | Status: AC
  Administered 2012-12-13: 3.375 g via INTRAVENOUS
  Filled 2012-12-13: qty 50

## 2012-12-13 MED ORDER — PIPERACILLIN-TAZOBACTAM 3.375 G IVPB
INTRAVENOUS | Status: AC
  Filled 2012-12-13: qty 50

## 2012-12-13 MED ORDER — DABIGATRAN ETEXILATE MESYLATE 150 MG PO CAPS: 150.0000 mg | ORAL_CAPSULE | Freq: Two times a day (BID) | ORAL | Status: DC

## 2012-12-13 MED ORDER — ALBUTEROL SULFATE HFA 108 (90 BASE) MCG/ACT IN AERS
2.0000 | INHALATION_SPRAY | Freq: Three times a day (TID) | RESPIRATORY_TRACT | Status: DC
  Administered 2012-12-13 – 2012-12-15 (×6): 2 via RESPIRATORY_TRACT
  Filled 2012-12-13: qty 6.7

## 2012-12-13 MED ORDER — DABIGATRAN ETEXILATE MESYLATE 150 MG PO CAPS
150.0000 mg | ORAL_CAPSULE | Freq: Two times a day (BID) | ORAL | Status: DC
  Administered 2012-12-13: 150 mg via ORAL
  Filled 2012-12-13 (×3): qty 1

## 2012-12-13 MED ORDER — DABIGATRAN ETEXILATE MESYLATE 150 MG PO CAPS
150.0000 mg | ORAL_CAPSULE | Freq: Two times a day (BID) | ORAL | Status: DC
  Administered 2012-12-13 – 2012-12-15 (×4): 150 mg via ORAL
  Filled 2012-12-13 (×6): qty 1

## 2012-12-13 MED ORDER — ATORVASTATIN CALCIUM 40 MG PO TABS
40.0000 mg | ORAL_TABLET | Freq: Every day | ORAL | Status: DC
  Administered 2012-12-13 – 2012-12-14 (×3): 40 mg via ORAL
  Filled 2012-12-13 (×3): qty 1

## 2012-12-13 MED ORDER — VANCOMYCIN HCL IN DEXTROSE 1-5 GM/200ML-% IV SOLN
1000.0000 mg | Freq: Once | INTRAVENOUS | Status: AC
  Administered 2012-12-13: 1000 mg via INTRAVENOUS
  Filled 2012-12-13: qty 200

## 2012-12-13 MED ORDER — VANCOMYCIN HCL 10 G IV SOLR
1500.0000 mg | INTRAVENOUS | Status: DC
  Administered 2012-12-14: 1500 mg via INTRAVENOUS
  Filled 2012-12-13 (×2): qty 1500

## 2012-12-13 MED ORDER — MUSCLE RUB 10-15 % EX CREA
TOPICAL_CREAM | CUTANEOUS | Status: DC | PRN
  Administered 2012-12-13: 01:00:00 via TOPICAL
  Filled 2012-12-13: qty 85

## 2012-12-13 MED ORDER — SENNA 8.6 MG PO TABS
2.0000 | ORAL_TABLET | Freq: Two times a day (BID) | ORAL | Status: DC
  Administered 2012-12-13: 25.8 mg via ORAL
  Administered 2012-12-13 – 2012-12-15 (×4): 17.2 mg via ORAL
  Filled 2012-12-13 (×4): qty 2
  Filled 2012-12-13: qty 3

## 2012-12-13 MED ORDER — PIPERACILLIN-TAZOBACTAM 3.375 G IVPB
3.3750 g | Freq: Three times a day (TID) | INTRAVENOUS | Status: DC
  Administered 2012-12-13 – 2012-12-15 (×6): 3.375 g via INTRAVENOUS
  Filled 2012-12-13 (×7): qty 50

## 2012-12-13 MED ORDER — FLUTICASONE PROPIONATE 50 MCG/ACT NA SUSP
1.0000 | Freq: Two times a day (BID) | NASAL | Status: DC | PRN
  Filled 2012-12-13: qty 16

## 2012-12-13 MED ORDER — PANTOPRAZOLE SODIUM 40 MG PO TBEC
40.0000 mg | DELAYED_RELEASE_TABLET | Freq: Every day | ORAL | Status: DC
  Administered 2012-12-13 – 2012-12-14 (×3): 40 mg via ORAL
  Filled 2012-12-13 (×3): qty 1

## 2012-12-13 MED ORDER — MOMETASONE FUROATE 220 MCG/INH IN AEPB
2.0000 | INHALATION_SPRAY | Freq: Two times a day (BID) | RESPIRATORY_TRACT | Status: DC
  Administered 2012-12-13 – 2012-12-15 (×3): 2 via RESPIRATORY_TRACT

## 2012-12-13 NOTE — Consult Note (Signed)
WOC wound consult note Reason for Consult:LLE Chronic venous insufficiency with ulceration  Wound type:Venous insufficiency Pressure Ulcer POA: No Measurement:10cm x 10cm field of erythema with bullae.  In the center is a 1cm x 3.5cm x 0.5cm full thickness ulceration Wound WRU:EAVWU, pink, moist. Drainage (amount, consistency, odor) moderate amount of serous fluid on old dressing Periwound:macerated, intact Dressing procedure/placement/frequency:I have consulted with Dr. Janee Morn on this patient and we will implement a POC that includes systemic antibiotics, elevation and topical care for the LLE ulcer.  We will cover with a silicone foam dressing for three days, topping with a Kerlix and Coban compression wrap.  On Tuesday, we will ask PT to wrap the LE with a multilayer compression dressing (Profore) following application of the topical silicone foam dressing.  That system will be changed twice weekly on Tuesdays and Fridays.  A referral to home care for an [facility] to perform this treatment is indicated. WOC nursing team will not follow, but will remain available to this patient, the nursing and medical team.  Please re-consult if needed. Thanks, Ladona Mow, MSN, RN, GNP, [place], [facility]AP [phone])

## 2012-12-13 NOTE — Progress Notes (Signed)
ANTIBIOTIC CONSULT NOTE-Preliminary  Pharmacy Consult for Vancomycin and Zosyn Indication: Cellulitis   Allergies  Allergen Reactions  . Terazosin     Patient is not familiar with this medication as an allergy    Patient Measurements: Height: 6' (182.9 cm) Weight: 222 lb 6.4 oz (100.88 kg) IBW/kg (Calculated) : 77.6   Vital Signs: Temp: 97.5 F (36.4 C) (10/25 2333) Temp src: Oral (10/25 2023) BP: 147/82 mmHg (10/25 2333) Pulse Rate: 61 (10/25 2333)  Labs:  Recent Labs  12/12/12 2059  WBC 6.2  HGB 14.7  PLT 133*  CREATININE 1.23    Estimated Creatinine Clearance: 71.6 ml/min (by C-G formula based on Cr of 1.23).  No results found for this basename: VANCOTROUGH, VANCOPEAK, VANCORANDOM, GENTTROUGH, GENTPEAK, GENTRANDOM, TOBRATROUGH, TOBRAPEAK, TOBRARND, AMIKACINPEAK, AMIKACINTROU, AMIKACIN,  in the last 72 hours   Microbiology: No results found for this or any previous visit (from the past 720 hour(s)).  Medical History: Past Medical History  Diagnosis Date  . Neuropathy   . COPD (chronic obstructive pulmonary disease)   . Diabetes mellitus   . Spinal stenosis   . Atrial fibrillation   . PTSD (post-traumatic stress disorder)     Medications:  Zosyn 3.375 Gm IV x 1 dose is the ED Vancomycin 1 GM IV in the ED  Assessment: 67 yo diabetic male with worsening LLE ulcer. X-ray shows no osseous involvement at this time. Empiric coverage for MRSA/Pseudomonas  Goal of Therapy:  Vancomycin troughs 10-15 mcg/ml Eradication of infection  Plan:  Preliminary review of pertinent patient information completed.  Protocol will be initiated with a one-time dose of Zosyn 3.375 GM IV 8 hours after the initial dose given in the ED.  Jeani Hawking clinical pharmacist will complete review during morning rounds to assess patient and finalize treatment regimen.  Arelia Sneddon, [facility] 12/13/2012,12:23 AM

## 2012-12-13 NOTE — Progress Notes (Signed)
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 Wound care is following and appreciate input and recommendations.  #2 atrial fibrillation Currently rate controlled. Bibasilar for anticoagulations.  #3 hypothyroidism Continue Synthroid.  #4 type 2 diabetes Sliding scale insulin.   Code Status: Full. Family Communication: Updated patient at bedside. Disposition Plan: Home in medically stable.   Consultants:  Wound care  Procedures:  None  Antibiotics:  IV Zosyn 12/13/2012  On IV vancomycin 12/13/2012  HPI/Subjective: Patient with no complaints.  Objective: Filed Vitals:   12/13/12 0644  BP: 108/65  Pulse: 52  Temp: 97.3 F (36.3 C)  Resp: 20    Intake/Output Summary (Last 24 hours) at 12/13/12 1337 Last data filed at 12/13/12 0952  Gross per 24 hour  Intake 1921.25 ml  Output    950 ml  Net 971.25 ml   Filed Weights   12/12/12 2023 12/12/12 2333  Weight: 100.88 kg (222 lb 6.4 oz) 100.5 kg (221 lb 9 oz)    Exam:   General:  NAD  Cardiovascular: RRR  Respiratory: CTAB  Abdomen: Soft, nontender, nondistended, positive bowel sounds.  Musculoskeletal: No clubbing or cyanosis. Left lower extremity with chronic venous insufficiency with ulceration. 10 cm x 10 cm field of erythema with bullae. In the center is a 1 cm x 3.5 cm x 0.5 cm full-thickness ulceration.  Data Reviewed: Basic Metabolic Panel:  Recent Labs Lab 12/12/12 2059 12/13/12 0713  NA 136 140  K 4.0 3.6  CL 98 102  CO2 29 29  GLUCOSE 140* 144*  BUN 19 18  CREATININE 1.23 0.98  CALCIUM 9.9 9.3   Liver Function Tests:  Recent Labs Lab 12/12/12 2059 12/13/12 0713  AST 17 17  ALT 17 15  ALKPHOS 76 63  BILITOT 0.7 0.9  PROT 7.2 6.0  ALBUMIN 4.4 3.6   No results found for this  basename: LIPASE, AMYLASE,  in the last 168 hours No results found for this basename: AMMONIA,  in the last 168 hours CBC:  Recent Labs Lab 12/12/12 2059 12/13/12 0713  WBC 6.2 5.0  NEUTROABS 4.2  --   HGB 14.7 13.3  HCT 42.1 38.8*  MCV 96.3 96.8  PLT 133* 113*   Cardiac Enzymes: No results found for this basename: CKTOTAL, CKMB, CKMBINDEX, TROPONINI,  in the last 168 hours BNP (last 3 results) No results found for this basename: PROBNP,  in the last 8760 hours CBG:  Recent Labs Lab 12/13/12 0710 12/13/12 1206  GLUCAP 137* 188*    No results found for this or any previous visit (from the past 240 hour(s)).   Studies: Dg Tibia/fibula Left  12/12/2012   CLINICAL DATA:  Leg pain.  Draining wound.  EXAM: LEFT TIBIA AND FIBULA - 2 VIEW  COMPARISON:  04/02/2012.  FINDINGS: There is infiltration of subcutaneous tissues along with the leg, more prominent laterally and distally. Tibia and fibula intact. There is no osteolysis. No radiopaque foreign body or gas within the soft tissues. Wound appears present with skin irregularity evident on the lateral view in the distal posterior leg.  IMPRESSION: No acute osseous abnormality. Infiltration of the soft tissues consistent with edema, possibly representing cellulitis. Wound or ulceration in the posterior distal leg.   Electronically Signed   By: Andreas Newport M.D.   On: 12/12/2012 22:31    Scheduled Meds: . allopurinol  300 mg Oral Daily  . aspirin EC  81 mg Oral Daily  . atorvastatin  40 mg Oral Daily  . colchicine  0.6 mg Oral Daily  . dabigatran  150 mg Oral Q12H  . finasteride  5 mg Oral Daily  . furosemide  20 mg Oral Daily  . insulin aspart  0-9 Units Subcutaneous TID WC  . levothyroxine  200 mcg Oral QAC breakfast  . mometasone  2 puff Inhalation BID  . pantoprazole  40 mg Oral Daily  . piperacillin-tazobactam (ZOSYN)  IV  3.375 g Intravenous Q8H  . senna  2-3 tablet Oral BID  . traZODone  25 mg Oral QHS  . [START ON  12/14/2012] vancomycin  1,500 mg Intravenous Q24H   Continuous Infusions: . sodium chloride 75 mL/hr at 12/13/12 0119    Active Problems:   A-fib   Hypothyroidism   Diabetes type 2, controlled   Diabetic ulcer of lower extremity   Bullosis diabeticorum    Time spent: 25 mins    [facility]  [facility] Pager [phone]. If 7PM-7AM, please contact night-coverage at [email or web address], password [facility] 12/13/2012, 1:37 PM  LOS: 1 day

## 2012-12-13 NOTE — Progress Notes (Addendum)
ANTIBIOTIC CONSULT NOTE  Pharmacy Consult for Vancomycin and Zosyn Indication: cellulitis  Allergies  Allergen Reactions  . Terazosin     Patient is not familiar with this medication as an allergy    Patient Measurements: Height: 6' (182.9 cm) Weight: 221 lb 9 oz (100.5 kg) IBW/kg (Calculated) : 77.6  Vital Signs: Temp: 97.3 F (36.3 C) (10/26 0644) Temp src: Oral (10/25 2023) BP: 108/65 mmHg (10/26 0644) Pulse Rate: 52 (10/26 0644) Intake/Output from previous day: 10/25 0701 - 10/26 0700 In: 1431.3 [P.O.:480; I.V.:201.3; IV Piggyback:750] Out: 200 [Urine:200] Intake/Output from this shift: Total I/O In: 50 [IV Piggyback:50] Out: 200 [Urine:200]  Labs:  Recent Labs  12/12/12 2059  WBC 6.2  HGB 14.7  PLT 133*  CREATININE 1.23   Estimated Creatinine Clearance: 71.5 ml/min (by C-G formula based on Cr of 1.23). No results found for this basename: VANCOTROUGH, VANCOPEAK, VANCORANDOM, GENTTROUGH, GENTPEAK, GENTRANDOM, TOBRATROUGH, TOBRAPEAK, TOBRARND, AMIKACINPEAK, AMIKACINTROU, AMIKACIN,  in the last 72 hours   Microbiology: No results found for this or any previous visit (from the past 720 hour(s)).  Medical History: Past Medical History  Diagnosis Date  . Neuropathy   . COPD (chronic obstructive pulmonary disease)   . Diabetes mellitus   . Spinal stenosis   . Atrial fibrillation   . PTSD (post-traumatic stress disorder)     Medications:  Prescriptions prior to admission  Medication Sig Dispense Refill  . albuterol (PROVENTIL HFA;VENTOLIN HFA) 108 (90 BASE) MCG/ACT inhaler Inhale 2 puffs into the lungs every 6 (six) hours as needed. Shortness of breath       . allopurinol (ZYLOPRIM) 300 MG tablet Take 300 mg by mouth daily.        Marland Kitchen aspirin EC 81 MG tablet Take 81 mg by mouth daily.        Marland Kitchen atorvastatin (LIPITOR) 80 MG tablet Take 40 mg by mouth daily.      . colchicine 0.6 MG tablet Take 0.6 mg by mouth daily.       . dabigatran (PRADAXA) 150 MG CAPS  capsule Take 150 mg by mouth every 12 (twelve) hours.      . furosemide (LASIX) 20 MG tablet Take 1 tablet (20 mg total) by mouth daily.  30 tablet  0  . gabapentin (NEURONTIN) 400 MG capsule Take 400 mg by mouth 3 (three) times daily as needed (Pain).      Marland Kitchen levothyroxine (SYNTHROID, LEVOTHROID) 100 MCG tablet Take 200 mcg by mouth every morning.       . metFORMIN (GLUCOPHAGE) 500 MG tablet Take 500 mg by mouth 2 (two) times daily.       . mometasone (ASMANEX) 220 MCG/INH inhaler Inhale 2 puffs into the lungs 2 (two) times daily. May use at home medication      . omeprazole (PRILOSEC) 20 MG capsule Take 20 mg by mouth at bedtime.       Marland Kitchen oxyCODONE (OXY IR/ROXICODONE) 5 MG immediate release tablet Take 10 mg by mouth every 8 (eight) hours as needed for pain.       Marland Kitchen senna (SENOKOT) 8.6 MG tablet Take 2 tablets by mouth 2 (two) times daily. Takes 2 tablets in the morning and 3 at bedtime. For constipation      . traZODone (DESYREL) 50 MG tablet Take 25 mg by mouth at bedtime.       Assessment: Okay for Protocol Obese male with an estimated normalized CrCL = (140-67.381)/(1.23)*(1-0.15*(2-2 )) = 59.04  Patient received Vancomycin 1000mg  last  evening and two doses of Zosyn 3.375gm.  Vancomycin 10/25 >> Zosyn 10/25 >>  Goal of Therapy:  Vancomycin trough level 10-15 mcg/ml  Plan:  Will give an additional Vancomycin 1000mg  this AM for a total loading dose of 2000mg  and then continue with 1500mg  IV every 24 hours. Zosyn 3.375gm IV every 8 hours. Measure antibiotic drug levels at steady state Follow up culture results  Lamonte Richer R 12/13/2012,7:30 AM

## 2012-12-14 LAB — BASIC METABOLIC PANEL
BUN: 15 mg/dL (ref 6–23)
CO2: 29 mEq/L (ref 19–32)
Calcium: 9.7 mg/dL (ref 8.4–10.5)
Chloride: 102 mEq/L (ref 96–112)
Creatinine, Ser: 1.06 mg/dL (ref 0.50–1.35)
GFR calc non Af Amer: 71 mL/min — ABNORMAL LOW (ref >90)
Glucose, Bld: 143 mg/dL — ABNORMAL HIGH (ref 70–99)

## 2012-12-14 LAB — CBC
Hemoglobin: 12.7 g/dL — ABNORMAL LOW (ref 13.0–17.0)
MCH: 33.2 pg (ref 26.0–34.0)
MCHC: 34.3 g/dL (ref 30.0–36.0)
MCV: 96.6 fL (ref 78.0–100.0)
Platelets: 105 10*3/uL — ABNORMAL LOW (ref 150–400)

## 2012-12-14 LAB — GLUCOSE, CAPILLARY
Glucose-Capillary: 162 mg/dL — ABNORMAL HIGH (ref 70–99)
Glucose-Capillary: 179 mg/dL — ABNORMAL HIGH (ref 70–99)

## 2012-12-14 MED ORDER — KETOROLAC TROMETHAMINE 30 MG/ML IJ SOLN
15.0000 mg | Freq: Four times a day (QID) | INTRAMUSCULAR | Status: DC | PRN
  Administered 2012-12-14: 15 mg via INTRAVENOUS
  Filled 2012-12-14: qty 1

## 2012-12-14 MED ORDER — GABAPENTIN 300 MG PO CAPS
600.0000 mg | ORAL_CAPSULE | Freq: Three times a day (TID) | ORAL | Status: DC
  Administered 2012-12-15: 600 mg via ORAL
  Filled 2012-12-14 (×2): qty 2

## 2012-12-14 MED ORDER — KETOROLAC TROMETHAMINE 30 MG/ML IJ SOLN: 30.0000 mg | Freq: Four times a day (QID) | INTRAMUSCULAR | Status: DC | PRN

## 2012-12-14 MED ORDER — KETOROLAC TROMETHAMINE 30 MG/ML IJ SOLN
30.0000 mg | Freq: Once | INTRAMUSCULAR | Status: AC
  Administered 2012-12-14: 30 mg via INTRAVENOUS
  Filled 2012-12-14: qty 1

## 2012-12-14 MED ORDER — ACETAMINOPHEN 325 MG PO TABS: 650.0000 mg | ORAL_TABLET | ORAL | Status: DC | PRN

## 2012-12-14 NOTE — Progress Notes (Signed)
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 Wound care is following and appreciate input and recommendations.  #2 atrial fibrillation Currently rate controlled. pradaxa for anticoagulation.  #3 hypothyroidism Continue Synthroid.  #4 type 2 diabetes Sliding scale insulin.   Code Status: Full. Family Communication: Updated patient at bedside. Disposition Plan: Home in medically stable.   Consultants:  Wound care  Procedures:  None  Antibiotics:  IV Zosyn 12/13/2012  On IV vancomycin 12/13/2012  HPI/Subjective: Patient with no complaints.  Objective: Filed Vitals:   12/14/12 1359  BP: 106/63  Pulse: 67  Temp: 98.1 F (36.7 C)  Resp: 20    Intake/Output Summary (Last 24 hours) at 12/14/12 1435 Last data filed at 12/14/12 0934  Gross per 24 hour  Intake 3171.25 ml  Output   2000 ml  Net 1171.25 ml   Filed Weights   12/12/12 2023 12/12/12 2333  Weight: 100.88 kg (222 lb 6.4 oz) 100.5 kg (221 lb 9 oz)    Exam:   General:  NAD  Cardiovascular: RRR  Respiratory: CTAB  Abdomen: Soft, nontender, nondistended, positive bowel sounds. Musculoskeletal: No clubbing or cyanosis. Left lower extremity wrapped.  Data Reviewed: Basic Metabolic Panel:  Recent Labs Lab 12/12/12 2059 12/13/12 0713 12/14/12 0456  NA 136 140 140  K 4.0 3.6 3.8  CL 98 102 102  CO2 29 29 29   GLUCOSE 140* 144* 143*  BUN 19 18 15   CREATININE 1.23 0.98 1.06  CALCIUM 9.9 9.3 9.7   Liver Function Tests:  Recent Labs Lab 12/12/12 2059 12/13/12 0713  AST 17 17  ALT 17 15  ALKPHOS 76 63  BILITOT 0.7 0.9  PROT 7.2 6.0  ALBUMIN 4.4 3.6   No results found for this basename: LIPASE, AMYLASE,  in the last 168 hours No results found for this basename: AMMONIA,  in the last 168  hours CBC:  Recent Labs Lab 12/12/12 2059 12/13/12 0713 12/14/12 0456  WBC 6.2 5.0 6.8  NEUTROABS 4.2  --   --   HGB 14.7 13.3 12.7*  HCT 42.1 38.8* 37.0*  MCV 96.3 96.8 96.6  PLT 133* 113* 105*   Cardiac Enzymes: No results found for this basename: CKTOTAL, CKMB, CKMBINDEX, TROPONINI,  in the last 168 hours BNP (last 3 results) No results found for this basename: PROBNP,  in the last 8760 hours CBG:  Recent Labs Lab 12/13/12 1206 12/13/12 1634 12/13/12 2129 12/14/12 0820 12/14/12 1150  GLUCAP 188* 150* 135* 149* 162*    No results found for this or any previous visit (from the past 240 hour(s)).   Studies: Dg Tibia/fibula Left  12/12/2012   CLINICAL DATA:  Leg pain.  Draining wound.  EXAM: LEFT TIBIA AND FIBULA - 2 VIEW  COMPARISON:  04/02/2012.  FINDINGS: There is infiltration of subcutaneous tissues along with the leg, more prominent laterally and distally. Tibia and fibula intact. There is no osteolysis. No radiopaque foreign body or gas within the soft tissues. Wound appears present with skin irregularity evident on the lateral view in the distal posterior leg.  IMPRESSION: No acute osseous abnormality. Infiltration of the soft tissues consistent with edema, possibly representing cellulitis. Wound or ulceration in the posterior distal leg.   Electronically Signed   By: Andreas Newport M.D.   On: 12/12/2012 22:31    Scheduled Meds: . albuterol  2 puff  Inhalation TID  . allopurinol  300 mg Oral Daily  . aspirin EC  81 mg Oral Daily  . atorvastatin  40 mg Oral Daily  . colchicine  0.6 mg Oral Daily  . dabigatran  150 mg Oral Q12H  . finasteride  5 mg Oral Daily  . furosemide  20 mg Oral Daily  . insulin aspart  0-9 Units Subcutaneous TID WC  . levothyroxine  200 mcg Oral QAC breakfast  . mometasone  2 puff Inhalation BID  . pantoprazole  40 mg Oral Daily  . piperacillin-tazobactam (ZOSYN)  IV  3.375 g Intravenous Q8H  . senna  2-3 tablet Oral BID  . traZODone   25 mg Oral QHS  . vancomycin  1,500 mg Intravenous Q24H   Continuous Infusions: . sodium chloride 75 mL/hr at 12/14/12 1610    Active Problems:   A-fib   Hypothyroidism   Diabetes type 2, controlled   Diabetic ulcer of lower extremity   Bullosis diabeticorum    Time spent: 25 mins    [facility]  [facility] Pager [phone]. If 7PM-7AM, please contact night-coverage at [email or web address], password [facility] 12/14/2012, 2:35 PM  LOS: 2 days

## 2012-12-14 NOTE — Care Management Note (Signed)
    Page 1 of 1   12/15/2012     3:24:47 PM   CARE MANAGEMENT NOTE 12/15/2012  Patient:  Tony [name], Tony [name]   Account Number:  [id number]  Date Initiated:  12/14/2012  Documentation initiated by:  Rosemary Holms  Subjective/Objective Assessment:   Pt from home. PCP at the [facility]. Elected to transfer to [place] but hopeful that he will not be transfered due to travel and expense. Pt may need to have HH RN at discharge. Previously had [facility] set up through the [place]. Per....     Action/Plan:   [facility], they do not have RN coverage in [place] at this time. Will follow up with PCP.   Anticipated DC Date:  12/15/2012   Anticipated DC Plan:  HOME W HOME HEALTH SERVICES         Choice offered to / List presented to:          [facility] arranged  HH-1 RN      [facility] agency  [facility]   Status of service:  Completed, signed off Medicare Important Message given?   (If response is "NO", the following Medicare IM given date fields will be blank) Date Medicare IM given:   Date Additional Medicare IM given:    Discharge Disposition:  HOME W HOME HEALTH SERVICES  Per UR Regulation:    If discussed at Long Length of Stay Meetings, dates discussed:    Comments:  12/15/12 Rosemary Holms RN BNS CM Call placed to RN [name] with the [facility] for f/u appt with PCP. [name] MD with [facility]. Telephone # [phone]. Per [name]) at [facility], Dr. will sign [facility] RN orders even if using Medicare benefits. No prior approval. [facility] approved for [facility]. [name] with [facility] notified of referral and PCP contact information.  12/14/12 [name] RN BSN CM Pt's transfer form faxed to [facility]

## 2012-12-14 NOTE — Progress Notes (Signed)
UR Chart Review Completed  

## 2012-12-15 LAB — BASIC METABOLIC PANEL
CO2: 29 mEq/L (ref 19–32)
Calcium: 9.1 mg/dL (ref 8.4–10.5)
Creatinine, Ser: 1.23 mg/dL (ref 0.50–1.35)
GFR calc non Af Amer: 59 mL/min — ABNORMAL LOW (ref >90)
Glucose, Bld: 168 mg/dL — ABNORMAL HIGH (ref 70–99)
Sodium: 139 mEq/L (ref 135–145)

## 2012-12-15 LAB — GLUCOSE, CAPILLARY: Glucose-Capillary: 163 mg/dL — ABNORMAL HIGH (ref 70–99)

## 2012-12-15 MED ORDER — SULFAMETHOXAZOLE-TMP DS 800-160 MG PO TABS
1.0000 | ORAL_TABLET | Freq: Two times a day (BID) | ORAL | Status: AC
Start: 2012-12-15 — End: 2012-12-20

## 2012-12-15 MED ORDER — SULFAMETHOXAZOLE-TMP DS 800-160 MG PO TABS
1.0000 | ORAL_TABLET | Freq: Two times a day (BID) | ORAL | Status: DC
  Administered 2012-12-15: 1 via ORAL
  Filled 2012-12-15: qty 1

## 2012-12-15 NOTE — Evaluation (Signed)
Physical Therapy Evaluation Patient Details Name: DERRIUS FURTICK MRN: [id number] DOB: 12/09/1945 Today's Date: 12/15/2012 Time: 4098-1191 PT Time Calculation (min): 35 min  PT Assessment / Plan / Recommendation History of Present Illness  Pt is admitted for tx of a LLE ulcer due to venous insufficiency.  He has DM, peripheral neuropathy, COPD, Afib, and chronic problems with LE ulcers.  The open wound is surrounded by a large blister.  Clinical Impression   Pt was seen for evaluation and application of Profore to the LLE.  He is alert and oriented, lives with his wife and is independent at home.  This large blister on the distal lateral aspect of his LLE just "appeared" and pt states the he came immediately to the hospital.  The blister measures 10 cm x 10 cm with an open area measuring 1cmx 4 cm x 0.5 cm.  The wound is draining a minimal amount of serous fluid and is pink in color.  The skin to the periphery of the blister is intact and healthy in appearance.   A foam dressing was applied to the wound followed by application of Profore.  Pt was instructed to elevate the LLE as much as possible and to let nursing know if it is painful in any way.  It is ordered to be changed each Tuesday and Friday.  We will follow while in the hospital.    PT Assessment  Patient needs continued PT services    Follow Up Recommendations  No PT follow up (nursing for wound care or wound care clinic)    Does the patient have the potential to tolerate intense rehabilitation      Barriers to Discharge        Equipment Recommendations  None recommended by PT    Recommendations for Other Services     Frequency Min 2X/week    Precautions / Restrictions Precautions Precautions: None Restrictions Weight Bearing Restrictions: No   Pertinent Vitals/Pain       Mobility       Exercises     PT Diagnosis: Other (comment) (non healing wound)  PT Problem List: Decreased skin integrity PT Treatment  Interventions: Other (comment) (application of Profore)     PT Goals(Current goals can be found in the care plan section) Acute Rehab PT Goals Patient Stated Goal: nne stated PT Goal Formulation: With patient Time For Goal Achievement: 12/29/12 Potential to Achieve Goals: Fair  Visit Information  Last PT Received On: 12/15/12 History of Present Illness: Pt is admitted for tx of a LLE ulcer due to venous insufficiency.  He has DM, peripheral neuropathy, COPD, Afib, and chronic problems with LE ulcers.  The open wound is surrounded by a large blister.       Prior Functioning  Home Living Family/patient expects to be discharged to:: Private residence Living Arrangements: Spouse/significant other Available Help at Discharge: Family Home Layout: One level Home Equipment: Chartered certified accountant - quad Prior Function Level of Independence: Independent with assistive device(s) Communication Communication: No difficulties    Cognition  Cognition Arousal/Alertness: Awake/alert Behavior During Therapy: WFL for tasks assessed/performed Overall Cognitive Status: Within Functional Limits for tasks assessed    Extremity/Trunk Assessment     Balance    End of Session PT - End of Session Activity Tolerance: Patient tolerated treatment well Patient left: in bed  GP     Myrlene Broker L 12/15/2012, 10:19 AM

## 2012-12-15 NOTE — Discharge Summary (Signed)
Physician Discharge Summary  Tony [name] NFA:[id number] DOB: Mar 21, 1945 DOA: 12/12/2012  PCP: [facility], MD  Admit date: 12/12/2012 Discharge date: 12/15/2012  Time spent: 65 minutes  Recommendations for Outpatient Follow-up:  1. Followup with [facility], MD in 1 week. Followup patient will need to be reassessed.  Discharge Diagnoses:  Principal Problem:   Diabetic ulcer of lower extremity Active Problems:   Bullosis diabeticorum   A-fib   Hypothyroidism   Diabetes type 2, controlled   Discharge Condition: Stable and improved  Diet recommendation: Heart healthy.  Filed Weights   12/12/12 2023 12/12/12 2333  Weight: 100.88 kg (222 lb 6.4 oz) 100.5 kg (221 lb 9 oz)    History of present illness:  67 year old male with a history of diabetes mellitus, chronic left leg ulcer, COPD, diabetic neuropathy, PTSD atrial fibrillation on chronic anticoagulation with Pradaxa, who came to the hospital after patient developed worsening of left leg ulcer this morning. Patient denies any fever, no chills, denies any injury to the leg. Patient says that he has had this ulcer in March at that time arterial Dopplers were done which did not show significant peripheral arterial disease. Patient has history of blisters which developed acutely and burst and drained clear fluid and is have been a recurrent problem. Patient denies any pain in the leg though he feels uncomfortable. He denies chest pain no shortness of breath no nausea vomiting or diarrhea. Patient has a history of diabetes mellitus and he feels that his blood sugars are well controlled. X-ray of the tibia-fibula done in the ED does not show any acute osseous involvement   Hospital Course:  #1 left lower extremity diabetic ulcer/bullous diabeticorum/cellulitis Patient was admitted with left leg ulcer, with a history of DM and chronic venous stasis changes. Patient was placed empirically on IV vancomycin and IV  Zosyn. Patient has had arterial Dopplers done which did not show significant peripheral arterial disease. Wound care consultation was obtained and it was noted that patient had a left lower extremity chronic venous insufficiency with ulceration which was 10 cm x 10 cm field of erythema with bullae. It was recommended that patient receive silicone foam dressing for 3 days supple with Kerlix and Coban compression wrap which patient obtained. On the day of discharge patient had his lower extremity wrapped with a multilayer compression dressing following application of topical silicone foam dressing. Patient was subsequently transitioned to oral Bactrim which she tolerated. Patient will be discharged on 5 more days of oral Bactrim to complete a one-week course of antibiotic therapy. Patient has been set up with home health RN for dressing changes twice weekly on Tuesdays and Fridays. Patient will followup with PCP as outpatient. #2 atrial fibrillation  Patient remained rate controlled during the hospitalization. He was also maintained on pradaxa for anticoagulation.  #3 hypothyroidism  Continued on home dose Synthroid.  #4 type 2 diabetes  Patient was maintained on sliding scale insulin.   Procedures:  None  Consultations: Wound care     Discharge Exam: Filed Vitals:   12/15/12 1319  BP: 104/60  Pulse:   Temp:   Resp:     General: NAD Cardiovascular: RRR Respiratory: CTAB  Discharge Instructions      Discharge Orders   Future Orders Complete By Expires   Diet - low sodium heart healthy  As directed    Discharge instructions  As directed    Comments:     Follow up with [facility], MD in 1 week.   Increase activity slowly  As directed        Medication List         albuterol 108 (90 BASE) MCG/ACT inhaler  Commonly known as:  PROVENTIL HFA;VENTOLIN HFA  Inhale 2 puffs into the lungs every 6 (six) hours as needed. Shortness of breath     allopurinol 300 MG  tablet  Commonly known as:  ZYLOPRIM  Take 300 mg by mouth daily.     aspirin EC 81 MG tablet  Take 81 mg by mouth daily.     atorvastatin 80 MG tablet  Commonly known as:  LIPITOR  Take 40 mg by mouth daily.     colchicine 0.6 MG tablet  Take 0.6 mg by mouth daily.     dabigatran 150 MG Caps capsule  Commonly known as:  PRADAXA  Take 150 mg by mouth every 12 (twelve) hours.     furosemide 20 MG tablet  Commonly known as:  LASIX  Take 1 tablet (20 mg total) by mouth daily.     gabapentin 400 MG capsule  Commonly known as:  NEURONTIN  Take 400 mg by mouth 3 (three) times daily as needed (Pain).     levothyroxine 100 MCG tablet  Commonly known as:  SYNTHROID, LEVOTHROID  Take 200 mcg by mouth every morning.     metFORMIN 500 MG tablet  Commonly known as:  GLUCOPHAGE  Take 500 mg by mouth 2 (two) times daily.     mometasone 220 MCG/INH inhaler  Commonly known as:  ASMANEX  Inhale 2 puffs into the lungs 2 (two) times daily. May use at home medication     omeprazole 20 MG capsule  Commonly known as:  PRILOSEC  Take 20 mg by mouth at bedtime.     oxyCODONE 5 MG immediate release tablet  Commonly known as:  Oxy IR/ROXICODONE  Take 10 mg by mouth every 8 (eight) hours as needed for pain.     senna 8.6 MG tablet  Commonly known as:  SENOKOT  Take 2 tablets by mouth 2 (two) times daily. Takes 2 tablets in the morning and 3 at bedtime. For constipation     sulfamethoxazole-trimethoprim 800-160 MG per tablet  Commonly known as:  BACTRIM DS  Take 1 tablet by mouth every 12 (twelve) hours. Take for 5 days then stop.     traZODone 50 MG tablet  Commonly known as:  DESYREL  Take 25 mg by mouth at bedtime.       Allergies  Allergen Reactions  . Terazosin     Patient is not familiar with this medication as an allergy   Follow-up Information   Follow up with [facility], [place]. Schedule an appointment as soon as possible for a visit in 1 week.   Specialty:   General Practice   Contact information:   [address]  [facility] [place] [address] [phone]        The results of significant diagnostics from this hospitalization (including imaging, microbiology, ancillary and laboratory) are listed below for reference.    Significant Diagnostic Studies: Dg Tibia/fibula Left  12/12/2012   CLINICAL DATA:  Leg pain.  Draining wound.  EXAM: LEFT TIBIA AND FIBULA - 2 VIEW  COMPARISON:  04/02/2012.  FINDINGS: There is infiltration of subcutaneous tissues along with the leg, more prominent laterally and distally. Tibia and fibula intact. There is no osteolysis. No radiopaque foreign body or gas within the soft  tissues. Wound appears present with skin irregularity evident on the lateral view in the distal posterior leg.  IMPRESSION: No acute osseous abnormality. Infiltration of the soft tissues consistent with edema, possibly representing cellulitis. Wound or ulceration in the posterior distal leg.   Electronically Signed   By: Andreas Newport M.D.   On: 12/12/2012 22:31    Microbiology: No results found for this or any previous visit (from the past 240 hour(s)).   Labs: Basic Metabolic Panel:  Recent Labs Lab 12/12/12 2059 12/13/12 0713 12/14/12 0456 12/15/12 0526  NA 136 140 140 139  K 4.0 3.6 3.8 4.0  CL 98 102 102 101  CO2 29 29 29 29   GLUCOSE 140* 144* 143* 168*  BUN 19 18 15 17   CREATININE 1.23 0.98 1.06 1.23  CALCIUM 9.9 9.3 9.7 9.1   Liver Function Tests:  Recent Labs Lab 12/12/12 2059 12/13/12 0713  AST 17 17  ALT 17 15  ALKPHOS 76 63  BILITOT 0.7 0.9  PROT 7.2 6.0  ALBUMIN 4.4 3.6   No results found for this basename: LIPASE, AMYLASE,  in the last 168 hours No results found for this basename: AMMONIA,  in the last 168 hours CBC:  Recent Labs Lab 12/12/12 2059 12/13/12 0713 12/14/12 0456  WBC 6.2 5.0 6.8  NEUTROABS 4.2  --   --   HGB 14.7 13.3 12.7*  HCT 42.1 38.8* 37.0*  MCV 96.3 96.8 96.6  PLT 133* 113* 105*    Cardiac Enzymes: No results found for this basename: CKTOTAL, CKMB, CKMBINDEX, TROPONINI,  in the last 168 hours BNP: BNP (last 3 results) No results found for this basename: PROBNP,  in the last 8760 hours CBG:  Recent Labs Lab 12/14/12 1150 12/14/12 1652 12/14/12 2146 12/15/12 0803 12/15/12 1107  GLUCAP 162* 179* 150* 136* 163*       Signed:  Idalis Hoelting  [facility] 12/15/2012, 1:50 PM

## 2013-06-12 ENCOUNTER — Emergency Department (HOSPITAL_COMMUNITY): Payer: Medicare Other

## 2013-06-12 ENCOUNTER — Emergency Department (HOSPITAL_COMMUNITY)
Admission: EM | Admit: 2013-06-12 | Discharge: 2013-06-12 | Disposition: A | Discharge status: Home / Self Care | Payer: Medicare Other | Attending: Emergency Medicine | Admitting: Emergency Medicine

## 2013-06-12 ENCOUNTER — Encounter (HOSPITAL_COMMUNITY): Payer: Self-pay | Admitting: Emergency Medicine

## 2013-06-12 DIAGNOSIS — E119 Type 2 diabetes mellitus without complications: Secondary | ICD-10-CM | POA: Insufficient documentation

## 2013-06-12 DIAGNOSIS — R55 Syncope and collapse: Secondary | ICD-10-CM | POA: Insufficient documentation

## 2013-06-12 DIAGNOSIS — J4489 Other specified chronic obstructive pulmonary disease: Secondary | ICD-10-CM | POA: Insufficient documentation

## 2013-06-12 DIAGNOSIS — Y929 Unspecified place or not applicable: Secondary | ICD-10-CM | POA: Insufficient documentation

## 2013-06-12 DIAGNOSIS — S1093XA Contusion of unspecified part of neck, initial encounter: Principal | ICD-10-CM

## 2013-06-12 DIAGNOSIS — S20229A Contusion of unspecified back wall of thorax, initial encounter: Secondary | ICD-10-CM | POA: Insufficient documentation

## 2013-06-12 DIAGNOSIS — Z8679 Personal history of other diseases of the circulatory system: Secondary | ICD-10-CM | POA: Insufficient documentation

## 2013-06-12 DIAGNOSIS — S0093XA Contusion of unspecified part of head, initial encounter: Secondary | ICD-10-CM

## 2013-06-12 DIAGNOSIS — Z8739 Personal history of other diseases of the musculoskeletal system and connective tissue: Secondary | ICD-10-CM | POA: Insufficient documentation

## 2013-06-12 DIAGNOSIS — Y9301 Activity, walking, marching and hiking: Secondary | ICD-10-CM | POA: Insufficient documentation

## 2013-06-12 DIAGNOSIS — J449 Chronic obstructive pulmonary disease, unspecified: Secondary | ICD-10-CM | POA: Insufficient documentation

## 2013-06-12 DIAGNOSIS — W108XXA Fall (on) (from) other stairs and steps, initial encounter: Secondary | ICD-10-CM | POA: Insufficient documentation

## 2013-06-12 DIAGNOSIS — Z7982 Long term (current) use of aspirin: Secondary | ICD-10-CM | POA: Insufficient documentation

## 2013-06-12 DIAGNOSIS — IMO0002 Reserved for concepts with insufficient information to code with codable children: Secondary | ICD-10-CM | POA: Insufficient documentation

## 2013-06-12 DIAGNOSIS — Z8659 Personal history of other mental and behavioral disorders: Secondary | ICD-10-CM | POA: Insufficient documentation

## 2013-06-12 DIAGNOSIS — S0083XA Contusion of other part of head, initial encounter: Principal | ICD-10-CM | POA: Insufficient documentation

## 2013-06-12 DIAGNOSIS — W19XXXA Unspecified fall, initial encounter: Secondary | ICD-10-CM

## 2013-06-12 DIAGNOSIS — Z79899 Other long term (current) drug therapy: Secondary | ICD-10-CM | POA: Insufficient documentation

## 2013-06-12 DIAGNOSIS — G589 Mononeuropathy, unspecified: Secondary | ICD-10-CM | POA: Insufficient documentation

## 2013-06-12 DIAGNOSIS — S0003XA Contusion of scalp, initial encounter: Secondary | ICD-10-CM | POA: Insufficient documentation

## 2013-06-12 LAB — CBG MONITORING, ED: Glucose-Capillary: 121 mg/dL — ABNORMAL HIGH (ref 70–99)

## 2013-06-12 MED ORDER — ONDANSETRON 8 MG PO TBDP
8.0000 mg | ORAL_TABLET | Freq: Once | ORAL | Status: AC
  Administered 2013-06-12: 8 mg via ORAL
  Filled 2013-06-12: qty 1

## 2013-06-12 NOTE — ED Notes (Signed)
Pt states he fell while walking up steps this afternoon. Pt states "I just lost my grip and fell". Pt states he fell down 6-7 steps. Pt's wife states "after that he passed out when we were going to the truck".  Pt does not recall this incident. Pt was found in ER lobby restroom lying on the floor. Pt is unsure if he passed out. Pt is A&Ox4 and c/o back pain, dizziness and states "I'm incredibly tired".

## 2013-06-12 NOTE — ED Provider Notes (Signed)
CSN: [id number]     Arrival date & time 06/12/13  1311 History  This chart was scribed for Maudry Diego, MD by Roxan Diesel, ED scribe.  This patient was seen in room APA11/APA11 and the patient's care was started at 3:06 PM.   Chief Complaint  Patient presents with  . Fall    Patient is a 68 y.o. male presenting with fall. The history is provided by the patient. No language interpreter was used.  Fall This is a new problem. The current episode started 3 to 5 hours ago. The problem occurs constantly. The problem has not changed since onset.Pertinent negatives include no chest pain, no abdominal pain and no headaches. Exacerbated by: certain movements. Nothing relieves the symptoms. He has tried nothing for the symptoms.    HPI Comments: Tony [name] is a 68 y.o. male who presents to the Emergency Department complaining of a fall that occurred 3 hours ago.  Pt states he lost his grip while walking up steps this afternoon and fell down 6-7 steps.  He thinks that he hit his head on the steps and lost consciousness, because the next thing he remembers was EMS personnel picking him up.  Currently he complains of throbbing pain to "almost my entire spinal column."  Pain is worsened by certain movements.  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  He is on Pradaxa.   Past Medical History  Diagnosis Date  . Neuropathy   . COPD (chronic obstructive pulmonary disease)   . Diabetes mellitus   . Spinal stenosis   . Atrial fibrillation   . PTSD (post-traumatic stress disorder)     Past Surgical History  Procedure Laterality Date  . Cholecystectomy      Family History  Problem Relation Age of Onset  . Heart disease Mother   . Prostate cancer Brother   . Kidney cancer Brother     History  Substance Use Topics  . Smoking status: Never Smoker   . Smokeless tobacco: Never Used  . Alcohol Use: No     Review of Systems   Constitutional: Negative for appetite change and fatigue.  HENT: Negative for congestion, ear discharge and sinus pressure.   Eyes: Negative for discharge.  Respiratory: Negative for cough.   Cardiovascular: Negative for chest pain.  Gastrointestinal: Negative for abdominal pain and diarrhea.  Genitourinary: Negative for frequency and hematuria.  Musculoskeletal: Positive for back pain.  Skin: Negative for rash.  Neurological: Positive for syncope. Negative for seizures and headaches.  Psychiatric/Behavioral: Negative for hallucinations.      Allergies  Terazosin  Home Medications   Prior to Admission medications   Medication Sig Start Date End Date Taking? Authorizing Provider  albuterol (PROVENTIL HFA;VENTOLIN HFA) 108 (90 BASE) MCG/ACT inhaler Inhale 2 puffs into the lungs every 6 (six) hours as needed. Shortness of breath     Historical Provider, MD  allopurinol (ZYLOPRIM) 300 MG tablet Take 300 mg by mouth daily.      Historical Provider, MD  aspirin EC 81 MG tablet Take 81 mg by mouth daily.      Historical Provider, MD  atorvastatin (LIPITOR) 80 MG tablet Take 40 mg by mouth daily.    Historical Provider, MD  colchicine 0.6 MG tablet Take 0.6 mg by mouth daily.     Historical Provider, MD  dabigatran (PRADAXA) 150 MG CAPS capsule Take 150 mg by mouth every 12 (twelve) hours.    Historical  Provider, MD  furosemide (LASIX) 20 MG tablet Take 1 tablet (20 mg total) by mouth daily. 05/14/12   Nimish Luther Parody, MD  gabapentin (NEURONTIN) 400 MG capsule Take 400 mg by mouth 3 (three) times daily as needed (Pain). 04/04/12   Annita Brod, MD  levothyroxine (SYNTHROID, LEVOTHROID) 100 MCG tablet Take 200 mcg by mouth every morning.     Historical Provider, MD  metFORMIN (GLUCOPHAGE) 500 MG tablet Take 500 mg by mouth 2 (two) times daily.     Historical Provider, MD  mometasone [facility]) 220 MCG/INH inhaler Inhale 2 puffs into the lungs 2 (two) times daily. May use at home  medication    Historical Provider, MD  omeprazole (PRILOSEC) 20 MG capsule Take 20 mg by mouth at bedtime.     Historical Provider, MD  oxyCODONE (OXY IR/ROXICODONE) 5 MG immediate release tablet Take 10 mg by mouth every 8 (eight) hours as needed for pain.     Historical Provider, MD  senna (SENOKOT) 8.6 MG tablet Take 2 tablets by mouth 2 (two) times daily. Takes 2 tablets in the morning and 3 at bedtime. For constipation    Historical Provider, MD  traZODone (DESYREL) 50 MG tablet Take 25 mg by mouth at bedtime.    Historical Provider, MD   BP 147/59  Pulse 49  Temp(Src) 98.3 F (36.8 C) (Oral)  Resp 18  SpO2 99%  Physical Exam  Constitutional: He is oriented to person, place, and time. He appears well-developed.  HENT:  Head: Normocephalic.  Eyes: Conjunctivae and EOM are normal. No scleral icterus.  Neck: Neck supple. No thyromegaly present.  Tender to posterior neck  Cardiovascular: Normal rate and regular rhythm.  Exam reveals no gallop and no friction rub.   No murmur heard. Pulmonary/Chest: No stridor. He has no wheezes. He has no rales. He exhibits no tenderness.  Abdominal: He exhibits no distension. There is no tenderness. There is no rebound.  Musculoskeletal: Normal range of motion. He exhibits tenderness. He exhibits no edema.  Tender to thoracic and lumbar spine  Lymphadenopathy:    He has no cervical adenopathy.  Neurological: He is oriented to person, place, and time. He exhibits normal muscle tone. Coordination normal.  Skin: No rash noted. No erythema.  Psychiatric: He has a normal mood and affect. His behavior is normal.    ED Course  Procedures (including critical care time)  DIAGNOSTIC STUDIES: Oxygen Saturation is 99% on room air, normal by my interpretation.    COORDINATION OF CARE: 3:12 PM-Discussed treatment plan which includes imaging with pt at bedside and pt agreed to plan.     Labs Review Labs Reviewed  CBG MONITORING, ED - Abnormal;  Notable for the following:    Glucose-Capillary 121 (*)    All other components within normal limits    Imaging Review No results found.   EKG Interpretation None      MDM   Final diagnoses:  None    The chart was scribed for me under my direct supervision.  I personally performed the history, physical, and medical decision making and all procedures in the evaluation of this patient.Maudry Diego, MD 06/12/13 [phone]

## 2013-06-12 NOTE — Discharge Instructions (Signed)
Rest at home and follow up with your md as needed

## 2013-10-21 DIAGNOSIS — Z8679 Personal history of other diseases of the circulatory system: Secondary | ICD-10-CM | POA: Insufficient documentation

## 2013-10-21 DIAGNOSIS — I495 Sick sinus syndrome: Secondary | ICD-10-CM | POA: Insufficient documentation

## 2014-01-04 ENCOUNTER — Emergency Department (HOSPITAL_COMMUNITY): Payer: Non-veteran care

## 2014-01-04 ENCOUNTER — Emergency Department (HOSPITAL_COMMUNITY)
Admission: EM | Admit: 2014-01-04 | Discharge: 2014-01-04 | Disposition: A | Discharge status: Home / Self Care | Payer: Non-veteran care | Attending: Emergency Medicine | Admitting: Emergency Medicine

## 2014-01-04 ENCOUNTER — Encounter (HOSPITAL_COMMUNITY): Payer: Self-pay | Admitting: *Deleted

## 2014-01-04 DIAGNOSIS — Z79899 Other long term (current) drug therapy: Secondary | ICD-10-CM | POA: Insufficient documentation

## 2014-01-04 DIAGNOSIS — R350 Frequency of micturition: Secondary | ICD-10-CM | POA: Diagnosis not present

## 2014-01-04 DIAGNOSIS — R319 Hematuria, unspecified: Secondary | ICD-10-CM

## 2014-01-04 DIAGNOSIS — F4310 Post-traumatic stress disorder, unspecified: Secondary | ICD-10-CM | POA: Insufficient documentation

## 2014-01-04 DIAGNOSIS — G629 Polyneuropathy, unspecified: Secondary | ICD-10-CM | POA: Insufficient documentation

## 2014-01-04 DIAGNOSIS — J449 Chronic obstructive pulmonary disease, unspecified: Secondary | ICD-10-CM | POA: Diagnosis not present

## 2014-01-04 DIAGNOSIS — I4891 Unspecified atrial fibrillation: Secondary | ICD-10-CM | POA: Diagnosis not present

## 2014-01-04 DIAGNOSIS — Z7982 Long term (current) use of aspirin: Secondary | ICD-10-CM | POA: Diagnosis not present

## 2014-01-04 DIAGNOSIS — Z950 Presence of cardiac pacemaker: Secondary | ICD-10-CM | POA: Insufficient documentation

## 2014-01-04 DIAGNOSIS — E119 Type 2 diabetes mellitus without complications: Secondary | ICD-10-CM | POA: Diagnosis not present

## 2014-01-04 DIAGNOSIS — R300 Dysuria: Secondary | ICD-10-CM | POA: Diagnosis not present

## 2014-01-04 DIAGNOSIS — R310 Gross hematuria: Secondary | ICD-10-CM | POA: Insufficient documentation

## 2014-01-04 HISTORY — DX: Presence of cardiac pacemaker: Z95.0

## 2014-01-04 LAB — URINALYSIS, ROUTINE W REFLEX MICROSCOPIC
BILIRUBIN URINE: NEGATIVE
GLUCOSE, UA: NEGATIVE mg/dL
Ketones, ur: NEGATIVE mg/dL
Leukocytes, UA: NEGATIVE
Nitrite: NEGATIVE
Protein, ur: 100 mg/dL — AB
Specific Gravity, Urine: 1.025 (ref 1.005–1.030)
UROBILINOGEN UA: 0.2 mg/dL (ref 0.0–1.0)
pH: 6.5 (ref 5.0–8.0)

## 2014-01-04 LAB — URINE MICROSCOPIC-ADD ON

## 2014-01-04 MED ORDER — CEPHALEXIN 500 MG PO CAPS: 500.0000 mg | ORAL_CAPSULE | Freq: Three times a day (TID) | ORAL | Status: DC

## 2014-01-04 NOTE — ED Provider Notes (Signed)
CSN: [id number]     Arrival date & time 01/04/14  1143 History  This chart was scribed for Janice Norrie, MD by Edison Simon, ED Scribe. This patient was seen in room APA11/APA11 and the patient's care was started at 1:43 PM.    Chief Complaint  Patient presents with  . Hematuria   The history is provided by the patient. No language interpreter was used.    HPI Comments: JENE ORAVEC is a 68 y.o. male who presents to the Emergency Department complaining of hematuria with onset this morning. He reports associated dysuria and frequency. He states he urinated during the night without problems and states he felt fine this morning until he went to urinate. He states that he has had hematuria every time he has urinated today. He denies abdominal pain, back pain, nausea, vomiting, or fever. He states he has been using Pradaxa for 1 year and used Coumadin before that; he denies having hematuria previously. He is using blood thinners for atrial fibrillation but notes he recently had a pacemaker put in a few months ago. He receives health care at the [place]. He states he does not have a urologist, but has had a cystoscopy with benign results about 4-5 years ago.  PCP [facility] in [place]  Past Medical History  Diagnosis Date  . Neuropathy   . COPD (chronic obstructive pulmonary disease)   . Diabetes mellitus   . Spinal stenosis   . Atrial fibrillation   . PTSD (post-traumatic stress disorder)   . Pacemaker    Past Surgical History  Procedure Laterality Date  . Cholecystectomy     Family History  Problem Relation Age of Onset  . Heart disease Mother   . Prostate cancer Brother   . Kidney cancer Brother    History  Substance Use Topics  . Smoking status: Never Smoker   . Smokeless tobacco: Never Used  . Alcohol Use: No  lives at home Lives with spouse Uses a wheelchair  Review of Systems  Constitutional: Negative for fever.  Gastrointestinal: Negative for nausea, vomiting and abdominal pain.   Genitourinary: Positive for dysuria, frequency and hematuria.  Musculoskeletal: Negative for back pain.  All other systems reviewed and are negative.     Allergies  Terazosin  Home Medications   Prior to Admission medications   Medication Sig Start Date End Date Taking? Authorizing Provider  pregabalin (LYRICA) 100 MG capsule Take 200 mg by mouth 3 (three) times daily.   Yes Historical Provider, MD  albuterol (PROVENTIL HFA;VENTOLIN HFA) 108 (90 BASE) MCG/ACT inhaler Inhale 2 puffs into the lungs every 6 (six) hours as needed. Shortness of breath     Historical Provider, MD  allopurinol (ZYLOPRIM) 300 MG tablet Take 300 mg by mouth daily.      Historical Provider, MD  aspirin EC 81 MG tablet Take 81 mg by mouth daily.      Historical Provider, MD  atorvastatin (LIPITOR) 80 MG tablet Take 40 mg by mouth daily.    Historical Provider, MD  colchicine 0.6 MG tablet Take 0.6 mg by mouth daily.     Historical Provider, MD  dabigatran (PRADAXA) 150 MG CAPS capsule Take 150 mg by mouth every 12 (twelve) hours.    Historical Provider, MD  diphenhydramine-acetaminophen (TYLENOL PM) 25-500 MG TABS Take 2 tablets by mouth at bedtime.    Historical Provider, MD  fluticasone (FLONASE) 50 MCG/ACT nasal spray Place 1 spray into both nostrils daily as needed for allergies or  rhinitis.    Historical Provider, MD  furosemide (LASIX) 20 MG tablet Take 20 mg by mouth every Monday, Wednesday, and Friday. 05/14/12   Nimish Luther Parody, MD  gabapentin (NEURONTIN) 300 MG capsule Take 900 mg by mouth 3 (three) times daily.    Historical Provider, MD  levothyroxine (SYNTHROID, LEVOTHROID) 100 MCG tablet Take 200 mcg by mouth every morning.     Historical Provider, MD  metFORMIN (GLUCOPHAGE) 500 MG tablet Take 500 mg by mouth 2 (two) times daily.     Historical Provider, MD  mometasone [facility]) 220 MCG/INH inhaler Inhale 2 puffs into the lungs daily. May use at home medication    Historical Provider, MD  omeprazole  (PRILOSEC) 20 MG capsule Take 20 mg by mouth at bedtime.     Historical Provider, MD  oxyCODONE (OXY IR/ROXICODONE) 5 MG immediate release tablet Take 5 mg by mouth every 4 (four) hours as needed (pain).     Historical Provider, MD  senna (SENOKOT) 8.6 MG tablet Take 2-3 tablets by mouth 2 (two) times daily. Takes 2 tablets in the morning and 3 at bedtime. For constipation    Historical Provider, MD  traZODone (DESYREL) 50 MG tablet Take 50 mg by mouth at bedtime.     Historical Provider, MD   BP 131/80 mmHg  Pulse 68  Temp(Src) 97.8 F (36.6 C) (Oral)  Resp 20  Ht 6\' 1"  (1.854 m)  Wt 220 lb (99.791 kg)  BMI 29.03 kg/m2  SpO2 100%  Vital signs normal   Physical Exam  Constitutional: He is oriented to person, place, and time. He appears well-developed and well-nourished.  Non-toxic appearance. He does not appear ill. No distress.  HENT:  Head: Normocephalic and atraumatic.  Right Ear: External ear normal.  Left Ear: External ear normal.  Nose: Nose normal. No mucosal edema or rhinorrhea.  Mouth/Throat: Oropharynx is clear and moist and mucous membranes are normal. No dental abscesses or uvula swelling.  Eyes: Conjunctivae and EOM are normal. Pupils are equal, round, and reactive to light.  Neck: Normal range of motion and full passive range of motion without pain. Neck supple.  Cardiovascular: Normal rate, regular rhythm and normal heart sounds.  Exam reveals no gallop and no friction rub.   No murmur heard. Pulmonary/Chest: Effort normal and breath sounds normal. No respiratory distress. He has no wheezes. He has no rhonchi. He has no rales. He exhibits no tenderness and no crepitus.  Abdominal: Soft. Normal appearance and bowel sounds are normal. He exhibits no distension. There is no tenderness. There is no rebound and no guarding.  Musculoskeletal: Normal range of motion. He exhibits no edema or tenderness.  Moves all extremities well.   Neurological: He is alert and oriented to  person, place, and time. He has normal strength. No cranial nerve deficit.  Skin: Skin is warm, dry and intact. No rash noted. No erythema. No pallor.  Psychiatric: He has a normal mood and affect. His speech is normal and behavior is normal. His mood appears not anxious.  Nursing note and vitals reviewed.   ED Course  Procedures (including critical care time)  DIAGNOSTIC STUDIES: Oxygen Saturation is 100% on room air, normal by my interpretation.    COORDINATION OF CARE: 1:51 PM Discussed treatment plan with patient at beside, the patient agrees with the plan and has no further questions at this time.  Pt and wife given test results. Urine culture sent. Pt to f/u at the [facility] this week with urology.  To return if he feels worse. Will start on antibiotics for possible cystitis. Should stop his pradaxa for a couple of days.   Labs Review Results for orders placed or performed during the hospital encounter of 01/04/14  Urinalysis, Routine w reflex microscopic  Result Value Ref Range   Color, Urine RED (A) YELLOW   APPearance CLOUDY (A) CLEAR   Specific Gravity, Urine 1.025 1.005 - 1.030   pH 6.5 5.0 - 8.0   Glucose, UA NEGATIVE NEGATIVE mg/dL   Hgb urine dipstick LARGE (A) NEGATIVE   Bilirubin Urine NEGATIVE NEGATIVE   Ketones, ur NEGATIVE NEGATIVE mg/dL   Protein, ur 100 (A) NEGATIVE mg/dL   Urobilinogen, UA 0.2 0.0 - 1.0 mg/dL   Nitrite NEGATIVE NEGATIVE   Leukocytes, UA NEGATIVE NEGATIVE  Urine microscopic-add on  Result Value Ref Range   RBC / HPF TOO NUMEROUS TO COUNT <3 RBC/hpf   Urine-Other URINALYSIS PERFORMED ON SUPERNATANT    Laboratory interpretation all normal except hematuria   Imaging Review Ct Renal Stone Study  01/04/2014   CLINICAL DATA:  Painless hematuria, pain with urination.  On Pradaxa  EXAM: CT ABDOMEN AND PELVIS WITHOUT CONTRAST  TECHNIQUE: Multidetector CT imaging of the abdomen and pelvis was performed following the standard protocol without IV  contrast.  COMPARISON:  05/07/2007  FINDINGS: There is old right posterior rib fracture. Sagittal images of the spine shows disc space flattening with vacuum disc phenomenon at L4-L5 and L5-S1 level.  Unenhanced liver shows no biliary ductal dilatation. The patient is status postcholecystectomy. Unenhanced pancreas, spleen and adrenal glands are unremarkable. Unenhanced kidneys shows no nephrolithiasis. Bilateral multiple probable renal cysts. Largest cyst in midpole of the right kidney measures 3.7 cm. Largest cyst in upper pole of the left kidney measures 1.9 cm. Cortical cyst in lower pole of the left kidney measures 1 cm. No hydronephrosis or hydroureter.  Atherosclerotic calcifications of abdominal aorta and iliac arteries. No aortic aneurysm.  Moderate stool in distal colon. No pericecal inflammation. Multiple sigmoid colon diverticula. No evidence of acute diverticulitis. No calcified ureteral calculi are noted bilaterally. There is thickening of urinary bladder wall. Findings are suspicious for cystitis. Clinical correlation is necessary. Prostate gland and seminal vesicles are unremarkable. No inguinal adenopathy. No destructive bony lesions are noted within pelvis.  No pericecal inflammation.  Normal appendix.  IMPRESSION: 1. There is old right posterior rib fracture. 2. No nephrolithiasis. No hydronephrosis or hydroureter. Probable bilateral multiple renal cysts as described above. 3. There is thickening of urinary bladder wall. Cystitis cannot be excluded. Clinical correlation is necessary. 4. Moderate colonic stool in distal colon. Sigmoid colon diverticula. No evidence of acute diverticulitis.   Electronically Signed   By: Lahoma Crocker M.D.   On: 01/04/2014 15:24     EKG Interpretation None      MDM   Final diagnoses:  Gross hematuria   New Prescriptions   CEPHALEXIN (KEFLEX) 500 MG CAPSULE    Take 1 capsule (500 mg total) by mouth 3 (three) times daily.    Plan discharge  Rolland Porter,  MD, FACEP    I personally performed the services described in this documentation, which was scribed in my presence. The recorded information has been reviewed and considered.  Rolland Porter, MD, FACEP   Janice Norrie, MD 01/04/14 [phone]

## 2014-01-04 NOTE — ED Notes (Addendum)
Hematuria , onset this am.  , dysuria  Pt uses a scooter and has a service dog with him

## 2014-01-04 NOTE — Discharge Instructions (Signed)
Drink plenty of fluids to keep your urine dilute so you don't form a clot that will obstruct you from urinating. Take the antibiotics until gone. Call the [facility] today to get an appointment to see a urologist this week. Return to the ED if you get fever, vomiting, start feeling weak or dizzy. Stop your pradaxa for the next 2 days, call your doctor if the bleeding doesn't get better to see if you should continue the pradaxa.    Hematuria Hematuria is blood in your urine. It can be caused by a bladder infection, kidney infection, prostate infection, kidney stone, or cancer of your urinary tract. Infections can usually be treated with medicine, and a kidney stone usually will pass through your urine. If neither of these is the cause of your hematuria, further workup to find out the reason may be needed. It is very important that you tell your health care provider about any blood you see in your urine, even if the blood stops without treatment or happens without causing pain. Blood in your urine that happens and then stops and then happens again can be a symptom of a very serious condition. Also, pain is not a symptom in the initial stages of many urinary cancers. HOME CARE INSTRUCTIONS   Drink lots of fluid, 3-4 quarts a day. If you have been diagnosed with an infection, cranberry juice is especially recommended, in addition to large amounts of water.  Avoid caffeine, tea, and carbonated beverages because they tend to irritate the bladder.  Avoid alcohol because it may irritate the prostate.  Take all medicines as directed by your health care provider.  If you were prescribed an antibiotic medicine, finish it all even if you start to feel better.  If you have been diagnosed with a kidney stone, follow your health care provider's instructions regarding straining your urine to catch the stone.  Empty your bladder often. Avoid holding urine for long periods of time.  After a bowel movement, women should  cleanse front to back. Use each tissue only once.  Empty your bladder before and after sexual intercourse if you are a male. SEEK MEDICAL CARE IF:  You develop back pain.  You have a fever.  You have a feeling of sickness in your stomach (nausea) or vomiting.  Your symptoms are not better in 3 days. Return sooner if you are getting worse. SEEK IMMEDIATE MEDICAL CARE IF:   You develop severe vomiting and are unable to keep the medicine down.  You develop severe back or abdominal pain despite taking your medicines.  You begin passing a large amount of blood or clots in your urine.  You feel extremely weak or faint, or you pass out. MAKE SURE YOU:   Understand these instructions.  Will watch your condition.  Will get help right away if you are not doing well or get worse. Document Released: 02/04/2005 Document Revised: 06/21/2013 Document Reviewed: 10/05/2012 [facility] Patient Information 2015 [place], [place]. This information is not intended to replace advice given to you by your health care provider. Make sure you discuss any questions you have with your health care provider.

## 2014-01-04 NOTE — ED Notes (Signed)
Second toe to left foot red and tender.  Pt is diabetic.

## 2014-01-07 LAB — URINE CULTURE

## 2014-01-09 ENCOUNTER — Telehealth (HOSPITAL_COMMUNITY): Payer: Self-pay

## 2014-01-09 NOTE — Telephone Encounter (Signed)
Post ED Visit - Positive Culture Follow-up  Culture report reviewed by antimicrobial stewardship pharmacist: []  Wes Dulaney, Pharm.D., BCPS []  Heide Guile, Pharm.D., BCPS [x]  Alycia Rossetti, Pharm.D., BCPS []  [place], [place].D., BCPS, AAHIVP []  Legrand Como, Pharm.D., BCPS, AAHIVP []  Elicia Lamp, Pharm.D.   Positive Urine culture, >/= 100,000 colonies -> proteus mirabilis Treated with Cephalexin, organism sensitive to the same and no further patient follow-up is required at this time.  Dortha Kern 01/09/2014, 4:48 AM

## 2014-05-28 ENCOUNTER — Encounter (HOSPITAL_COMMUNITY): Payer: Self-pay | Admitting: Emergency Medicine

## 2014-05-28 ENCOUNTER — Emergency Department (HOSPITAL_COMMUNITY): Payer: Medicare HMO

## 2014-05-28 ENCOUNTER — Emergency Department (HOSPITAL_COMMUNITY)
Admission: EM | Admit: 2014-05-28 | Discharge: 2014-05-28 | Disposition: A | Discharge status: Home / Self Care | Payer: Medicare HMO | Attending: Emergency Medicine | Admitting: Emergency Medicine

## 2014-05-28 DIAGNOSIS — Z8669 Personal history of other diseases of the nervous system and sense organs: Secondary | ICD-10-CM | POA: Diagnosis not present

## 2014-05-28 DIAGNOSIS — R52 Pain, unspecified: Secondary | ICD-10-CM | POA: Diagnosis present

## 2014-05-28 DIAGNOSIS — R0602 Shortness of breath: Secondary | ICD-10-CM

## 2014-05-28 DIAGNOSIS — L03116 Cellulitis of left lower limb: Secondary | ICD-10-CM | POA: Diagnosis not present

## 2014-05-28 DIAGNOSIS — Z7982 Long term (current) use of aspirin: Secondary | ICD-10-CM | POA: Insufficient documentation

## 2014-05-28 DIAGNOSIS — R197 Diarrhea, unspecified: Secondary | ICD-10-CM | POA: Diagnosis not present

## 2014-05-28 DIAGNOSIS — Z79899 Other long term (current) drug therapy: Secondary | ICD-10-CM | POA: Diagnosis not present

## 2014-05-28 DIAGNOSIS — M791 Myalgia: Secondary | ICD-10-CM | POA: Insufficient documentation

## 2014-05-28 DIAGNOSIS — R112 Nausea with vomiting, unspecified: Secondary | ICD-10-CM | POA: Diagnosis not present

## 2014-05-28 DIAGNOSIS — Z8659 Personal history of other mental and behavioral disorders: Secondary | ICD-10-CM | POA: Diagnosis not present

## 2014-05-28 DIAGNOSIS — J441 Chronic obstructive pulmonary disease with (acute) exacerbation: Secondary | ICD-10-CM | POA: Diagnosis not present

## 2014-05-28 DIAGNOSIS — E119 Type 2 diabetes mellitus without complications: Secondary | ICD-10-CM | POA: Diagnosis not present

## 2014-05-28 DIAGNOSIS — Z950 Presence of cardiac pacemaker: Secondary | ICD-10-CM | POA: Diagnosis not present

## 2014-05-28 DIAGNOSIS — R531 Weakness: Secondary | ICD-10-CM | POA: Insufficient documentation

## 2014-05-28 DIAGNOSIS — L03032 Cellulitis of left toe: Secondary | ICD-10-CM

## 2014-05-28 DIAGNOSIS — I4891 Unspecified atrial fibrillation: Secondary | ICD-10-CM | POA: Insufficient documentation

## 2014-05-28 LAB — CBC WITH DIFFERENTIAL/PLATELET
Basophils Absolute: 0 10*3/uL (ref 0.0–0.1)
Basophils Relative: 0 % (ref 0–1)
EOS ABS: 0 10*3/uL (ref 0.0–0.7)
EOS PCT: 1 % (ref 0–5)
HCT: 40.3 % (ref 39.0–52.0)
Hemoglobin: 13.3 g/dL (ref 13.0–17.0)
Lymphocytes Relative: 13 % (ref 12–46)
Lymphs Abs: 1 10*3/uL (ref 0.7–4.0)
MCH: 31.1 pg (ref 26.0–34.0)
MCHC: 33 g/dL (ref 30.0–36.0)
MCV: 94.2 fL (ref 78.0–100.0)
MONOS PCT: 7 % (ref 3–12)
Monocytes Absolute: 0.5 10*3/uL (ref 0.1–1.0)
Neutro Abs: 5.7 10*3/uL (ref 1.7–7.7)
Neutrophils Relative %: 79 % — ABNORMAL HIGH (ref 43–77)
Platelets: 141 10*3/uL — ABNORMAL LOW (ref 150–400)
RBC: 4.28 MIL/uL (ref 4.22–5.81)
RDW: 13.9 % (ref 11.5–15.5)
WBC: 7.2 10*3/uL (ref 4.0–10.5)

## 2014-05-28 LAB — URINALYSIS, ROUTINE W REFLEX MICROSCOPIC
BILIRUBIN URINE: NEGATIVE
Glucose, UA: NEGATIVE mg/dL
Ketones, ur: NEGATIVE mg/dL
Leukocytes, UA: NEGATIVE
NITRITE: NEGATIVE
PH: 6 (ref 5.0–8.0)
PROTEIN: NEGATIVE mg/dL
Specific Gravity, Urine: 1.015 (ref 1.005–1.030)
Urobilinogen, UA: 0.2 mg/dL (ref 0.0–1.0)

## 2014-05-28 LAB — COMPREHENSIVE METABOLIC PANEL
ALT: 24 U/L (ref 0–53)
AST: 28 U/L (ref 0–37)
Albumin: 4.2 g/dL (ref 3.5–5.2)
Alkaline Phosphatase: 70 U/L (ref 39–117)
Anion gap: 11 (ref 5–15)
BUN: 15 mg/dL (ref 6–23)
CALCIUM: 9.3 mg/dL (ref 8.4–10.5)
CHLORIDE: 102 mmol/L (ref 96–112)
CO2: 25 mmol/L (ref 19–32)
Creatinine, Ser: 1.05 mg/dL (ref 0.50–1.35)
GFR calc Af Amer: 82 mL/min — ABNORMAL LOW (ref >90)
GFR, EST NON AFRICAN AMERICAN: 71 mL/min — AB (ref >90)
Glucose, Bld: 195 mg/dL — ABNORMAL HIGH (ref 70–99)
Potassium: 4.3 mmol/L (ref 3.5–5.1)
Sodium: 138 mmol/L (ref 135–145)
TOTAL PROTEIN: 7 g/dL (ref 6.0–8.3)
Total Bilirubin: 1.3 mg/dL — ABNORMAL HIGH (ref 0.3–1.2)

## 2014-05-28 LAB — INFLUENZA PANEL BY PCR (TYPE A & B)
H1N1 flu by pcr: NOT DETECTED
INFLAPCR: NEGATIVE
INFLBPCR: NEGATIVE

## 2014-05-28 LAB — BRAIN NATRIURETIC PEPTIDE: B Natriuretic Peptide: 174 pg/mL — ABNORMAL HIGH (ref 0.0–100.0)

## 2014-05-28 LAB — CBG MONITORING, ED: GLUCOSE-CAPILLARY: 188 mg/dL — AB (ref 70–99)

## 2014-05-28 LAB — URINE MICROSCOPIC-ADD ON

## 2014-05-28 LAB — TROPONIN I: Troponin I: <0.03 ng/mL (ref <0.031)

## 2014-05-28 LAB — I-STAT CG4 LACTIC ACID, ED: Lactic Acid, Venous: 2.84 mmol/L (ref 0.5–2.0)

## 2014-05-28 MED ORDER — CIPROFLOXACIN HCL 250 MG PO TABS
500.0000 mg | ORAL_TABLET | Freq: Once | ORAL | Status: AC
  Administered 2014-05-28: 500 mg via ORAL
  Filled 2014-05-28: qty 2

## 2014-05-28 MED ORDER — SULFAMETHOXAZOLE-TRIMETHOPRIM 800-160 MG PO TABS: 1.0000 | ORAL_TABLET | Freq: Two times a day (BID) | ORAL | Status: DC

## 2014-05-28 MED ORDER — ONDANSETRON HCL 4 MG/2ML IJ SOLN
4.0000 mg | Freq: Once | INTRAMUSCULAR | Status: AC
  Administered 2014-05-28: 4 mg via INTRAVENOUS
  Filled 2014-05-28: qty 2

## 2014-05-28 MED ORDER — MORPHINE SULFATE 4 MG/ML IJ SOLN
4.0000 mg | Freq: Once | INTRAMUSCULAR | Status: AC
  Administered 2014-05-28: 4 mg via INTRAVENOUS
  Filled 2014-05-28: qty 1

## 2014-05-28 MED ORDER — SULFAMETHOXAZOLE-TRIMETHOPRIM 800-160 MG PO TABS
1.0000 | ORAL_TABLET | Freq: Once | ORAL | Status: AC
  Administered 2014-05-28: 1 via ORAL
  Filled 2014-05-28: qty 1

## 2014-05-28 MED ORDER — CIPROFLOXACIN HCL 500 MG PO TABS: 500.0000 mg | ORAL_TABLET | Freq: Two times a day (BID) | ORAL | Status: DC

## 2014-05-28 NOTE — ED Notes (Addendum)
CRITICAL VALUE ALERT  Critical value received:  Lactic Acid 2.84-data did not transmitt  Date of notification:  05/28/2014  Time of notification: 3943  Critical value read back:Yes.    Nurse who received alert:  Domenica Reamer RN  MD notified (1st page):  Dr Betsey Holiday  Time of first page:  67  MD notified (2nd page):  Time of second page:  Responding MD:  Dr Betsey Holiday   Time MD responded:  [phone]

## 2014-05-28 NOTE — ED Provider Notes (Signed)
CSN: [id number]     Arrival date & time 05/28/14  1634 History   First MD Initiated Contact with Patient 05/28/14 1649     Chief Complaint  Patient presents with  . Generalized Body Aches     (Consider location/radiation/quality/duration/timing/severity/associated sxs/prior Treatment) HPI Comments: Patient presents to the ER for evaluation of multiple problems. Patient reports that he had significant trouble sleeping last night. He reports that he woke up several times "startled" and then felt like he was having trouble breathing. He thought he might be having a panic attack. He did not sleep well last night and then through the course of today he has developed generalized body aches, diarrhea with nausea and vomiting. He has not had a cough. He has not documented any fevers. There is no chest pain.   Past Medical History  Diagnosis Date  . Neuropathy   . COPD (chronic obstructive pulmonary disease)   . Diabetes mellitus   . Spinal stenosis   . Atrial fibrillation   . PTSD (post-traumatic stress disorder)   . Pacemaker    Past Surgical History  Procedure Laterality Date  . Cholecystectomy     Family History  Problem Relation Age of Onset  . Heart disease Mother   . Prostate cancer Brother   . Kidney cancer Brother    History  Substance Use Topics  . Smoking status: Never Smoker   . Smokeless tobacco: Never Used  . Alcohol Use: No    Review of Systems  Constitutional: Positive for fatigue.  Respiratory: Positive for shortness of breath.   Gastrointestinal: Positive for nausea, vomiting and diarrhea.  Musculoskeletal: Positive for myalgias.  All other systems reviewed and are negative.     Allergies  Terazosin  Home Medications   Prior to Admission medications   Medication Sig Start Date End Date Taking? Authorizing Provider  acetaminophen (TYLENOL) 500 MG tablet Take 1,000 mg by mouth every 6 (six) hours as needed for mild pain.   Yes Historical Provider, MD    albuterol (PROVENTIL HFA;VENTOLIN HFA) 108 (90 BASE) MCG/ACT inhaler Inhale 2 puffs into the lungs every 6 (six) hours as needed. Shortness of breath    Yes Historical Provider, MD  allopurinol (ZYLOPRIM) 300 MG tablet Take 300 mg by mouth daily.     Yes Historical Provider, MD  ammonium lactate (LAC-HYDRIN) 12 % lotion Apply 1 application topically as needed for dry skin.   Yes Historical Provider, MD  aspirin EC 81 MG tablet Take 81 mg by mouth daily.     Yes Historical Provider, MD  atorvastatin (LIPITOR) 80 MG tablet Take 40 mg by mouth daily.   Yes Historical Provider, MD  capsaicin (ZOSTRIX) 0.025 % cream Apply 1 application topically 2 (two) times daily.   Yes Historical Provider, MD  dabigatran (PRADAXA) 150 MG CAPS capsule Take 150 mg by mouth every 12 (twelve) hours.   Yes Historical Provider, MD  finasteride (PROSCAR) 5 MG tablet Take 5 mg by mouth daily.   Yes Historical Provider, MD  fluticasone (FLONASE) 50 MCG/ACT nasal spray Place 1 spray into both nostrils daily as needed for allergies or rhinitis.   Yes Historical Provider, MD  levothyroxine (SYNTHROID, LEVOTHROID) 100 MCG tablet Take 200 mcg by mouth every morning.    Yes Historical Provider, MD  metFORMIN (GLUCOPHAGE) 500 MG tablet Take 500 mg by mouth 2 (two) times daily.    Yes Historical Provider, MD  mometasone [facility]) 220 MCG/INH inhaler Inhale 2 puffs into the lungs daily.  May use at home medication   Yes Historical Provider, MD  omeprazole (PRILOSEC) 20 MG capsule Take 20 mg by mouth at bedtime.    Yes Historical Provider, MD  oxyCODONE (OXY IR/ROXICODONE) 5 MG immediate release tablet Take 5-10 mg by mouth every 8 (eight) hours as needed for moderate pain.    Yes Historical Provider, MD  pregabalin (LYRICA) 100 MG capsule Take 200 mg by mouth 3 (three) times daily.   Yes Historical Provider, MD  sennosides-docusate sodium (SENOKOT-S) 8.6-50 MG tablet Take 2 tablets by mouth 2 (two) times daily.   Yes Historical  Provider, MD  traZODone (DESYREL) 50 MG tablet Take 50 mg by mouth at bedtime.    Yes Historical Provider, MD  ciprofloxacin (CIPRO) 500 MG tablet Take 1 tablet (500 mg total) by mouth 2 (two) times daily. 05/28/14   Orpah Greek, MD  diphenhydramine-acetaminophen (TYLENOL PM) 25-500 MG TABS Take 2 tablets by mouth at bedtime.    Historical Provider, MD  sulfamethoxazole-trimethoprim (SEPTRA DS) 800-160 MG per tablet Take 1 tablet by mouth every 12 (twelve) hours. 05/28/14   Orpah Greek, MD   BP 100/84 mmHg  Pulse 69  Temp(Src) 98.2 F (36.8 C) (Oral)  Resp 15  Ht 6\' 1"  (1.854 m)  Wt 228 lb (103.42 kg)  BMI 30.09 kg/m2  SpO2 100% Physical Exam  Constitutional: He is oriented to person, place, and time. He appears well-developed and well-nourished. No distress.  HENT:  Head: Normocephalic and atraumatic.  Right Ear: Hearing normal.  Left Ear: Hearing normal.  Nose: Nose normal.  Mouth/Throat: Oropharynx is clear and moist and mucous membranes are normal.  Eyes: Conjunctivae and EOM are normal. Pupils are equal, round, and reactive to light.  Neck: Normal range of motion. Neck supple.  Cardiovascular: Regular rhythm, S1 normal and S2 normal.  Exam reveals no gallop and no friction rub.   No murmur heard. Pulmonary/Chest: Effort normal and breath sounds normal. No respiratory distress. He exhibits no tenderness.  Abdominal: Soft. Normal appearance and bowel sounds are normal. There is no hepatosplenomegaly. There is no tenderness. There is no rebound, no guarding, no tenderness at McBurney's point and negative Murphy's sign. No hernia.  Musculoskeletal: Normal range of motion.  Neurological: He is alert and oriented to person, place, and time. He has normal strength. No cranial nerve deficit or sensory deficit. Coordination normal. GCS eye subscore is 4. GCS verbal subscore is 5. GCS motor subscore is 6.  Skin: Skin is warm, dry and intact. No rash noted. No cyanosis.      Psychiatric: He has a normal mood and affect. His speech is normal and behavior is normal. Thought content normal.  Nursing note and vitals reviewed.   ED Course  Procedures (including critical care time) Labs Review Labs Reviewed  CBC WITH DIFFERENTIAL/PLATELET - Abnormal; Notable for the following:    Platelets 141 (*)    Neutrophils Relative % 79 (*)    All other components within normal limits  COMPREHENSIVE METABOLIC PANEL - Abnormal; Notable for the following:    Glucose, Bld 195 (*)    Total Bilirubin 1.3 (*)    GFR calc non Af Amer 71 (*)    GFR calc Af Amer 82 (*)    All other components within normal limits  URINALYSIS, ROUTINE W REFLEX MICROSCOPIC - Abnormal; Notable for the following:    Hgb urine dipstick LARGE (*)    All other components within normal limits  BRAIN NATRIURETIC PEPTIDE -  Abnormal; Notable for the following:    B Natriuretic Peptide 174.0 (*)    All other components within normal limits  CBG MONITORING, ED - Abnormal; Notable for the following:    Glucose-Capillary 188 (*)    All other components within normal limits  I-STAT CG4 LACTIC ACID, ED - Abnormal; Notable for the following:    Lactic Acid, Venous 2.84 (*)    All other components within normal limits  TROPONIN I  INFLUENZA PANEL BY PCR (TYPE A & B, H1N1)  URINE MICROSCOPIC-ADD ON    Imaging Review Dg Chest 2 View  05/28/2014   CLINICAL DATA:  Shortness of breath, panic attack  EXAM: CHEST  2 VIEW  COMPARISON:  06/12/2013  FINDINGS: Cardiomediastinal silhouette is stable. No acute infiltrate or pleural effusion. No pulmonary edema. Bony thorax is unremarkable. There is dual lead cardiac pacemaker with leads in right atrium and right ventricle.  IMPRESSION: No active cardiopulmonary disease.   Electronically Signed   By: Lahoma Crocker M.D.   On: 05/28/2014 18:20   Dg Foot Complete Left  05/28/2014   CLINICAL DATA:  Second toes swelling and redness.  Left foot pain.  EXAM: LEFT FOOT - COMPLETE 3+  VIEW  COMPARISON:  April 02, 2012.  FINDINGS: No fracture or dislocation is noted. Peripheral erosions with sclerotic margins are seen involving multiple joints, including the distal portions of the first and second metatarsals as well as the first proximal and distal phalanges. These are not significantly changed compared to prior exam and are suspicious for gout. No lytic destruction is seen to suggest osteomyelitis.  IMPRESSION: Multiple peripheral erosions with sclerotic margins are seen involving multiple joints suggesting gout. This is unchanged compared to prior exam. No acute abnormality is noted in the left foot.   Electronically Signed   By: Marijo Conception, M.D.   On: 05/28/2014 18:27     EKG Interpretation   Date/Time:  Saturday May 28 2014 17:06:03 EDT Ventricular Rate:  76 PR Interval:    QRS Duration: 102 QT Interval:  403 QTC Calculation: 453 R Axis:   66 Text Interpretation:  Atrial fibrillation Premature ventricular complexes  Nonspecific ST and T wave abnormality Confirmed by Osmara Drummonds  MD,  Emerson Schreifels [phone]) on 05/28/2014 6:29:30 PM      MDM   Final diagnoses:  Shortness of breath  Weakness  Cellulitis of toe of left foot    Patient presented to the ER for evaluation of multiple problems. Patient reports that he had difficulty sleeping last night he woke up with shortness of breath and feeling very anxious. He believes this might abandon anxiety attack. His breathing has improved here in the ER, but he has been feeling malaise and has developed nausea, vomiting and diarrhea. He is a diabetic, glucose is fairly well-controlled. Lab work was unremarkable. Examination did reveal erythema and swelling of the left second toe. He has an associated chronic ulceration of the distal aspect of the toe. This is consistent with early cellulitis. No evidence of osteomyelitis.  Patient is not expressing shortness of breath currently. Oxygenation is 100%. Chest x-ray is clear.  Cardiac evaluation is unremarkable.  Patient is appropriate for discharge. He'll be treated empirically with Cipro and Bactrim for cellulitis, follow-up with his primary doctor. Return to the ER if symptoms worsen.    Orpah Greek, MD 05/28/14 2013

## 2014-05-28 NOTE — Discharge Instructions (Signed)

## 2014-05-28 NOTE — ED Notes (Addendum)
Pt reports "panic attack" early this am. Pt reports generalized body aches,nausea, malaise today.

## 2014-08-23 ENCOUNTER — Inpatient Hospital Stay (HOSPITAL_COMMUNITY): Payer: Medicare PPO

## 2014-08-23 ENCOUNTER — Inpatient Hospital Stay (HOSPITAL_COMMUNITY)
Admission: EM | Admit: 2014-08-23 | Discharge: 2014-08-27 | DRG: 603 | Disposition: A | Discharge status: Home Health | Payer: Medicare PPO | Attending: Family Medicine | Admitting: Family Medicine

## 2014-08-23 ENCOUNTER — Encounter (HOSPITAL_COMMUNITY): Payer: Self-pay | Admitting: Emergency Medicine

## 2014-08-23 DIAGNOSIS — I482 Chronic atrial fibrillation, unspecified: Secondary | ICD-10-CM | POA: Diagnosis present

## 2014-08-23 DIAGNOSIS — Z8249 Family history of ischemic heart disease and other diseases of the circulatory system: Secondary | ICD-10-CM | POA: Diagnosis not present

## 2014-08-23 DIAGNOSIS — I4891 Unspecified atrial fibrillation: Secondary | ICD-10-CM | POA: Diagnosis present

## 2014-08-23 DIAGNOSIS — E1142 Type 2 diabetes mellitus with diabetic polyneuropathy: Secondary | ICD-10-CM | POA: Diagnosis present

## 2014-08-23 DIAGNOSIS — Z950 Presence of cardiac pacemaker: Secondary | ICD-10-CM | POA: Diagnosis not present

## 2014-08-23 DIAGNOSIS — L03115 Cellulitis of right lower limb: Principal | ICD-10-CM | POA: Diagnosis present

## 2014-08-23 DIAGNOSIS — Z8042 Family history of malignant neoplasm of prostate: Secondary | ICD-10-CM | POA: Diagnosis not present

## 2014-08-23 DIAGNOSIS — L97519 Non-pressure chronic ulcer of other part of right foot with unspecified severity: Secondary | ICD-10-CM | POA: Diagnosis present

## 2014-08-23 DIAGNOSIS — E119 Type 2 diabetes mellitus without complications: Secondary | ICD-10-CM

## 2014-08-23 DIAGNOSIS — J449 Chronic obstructive pulmonary disease, unspecified: Secondary | ICD-10-CM | POA: Diagnosis present

## 2014-08-23 DIAGNOSIS — L03031 Cellulitis of right toe: Secondary | ICD-10-CM | POA: Diagnosis present

## 2014-08-23 DIAGNOSIS — F4310 Post-traumatic stress disorder, unspecified: Secondary | ICD-10-CM | POA: Diagnosis present

## 2014-08-23 LAB — CBC WITH DIFFERENTIAL/PLATELET
BASOS ABS: 0 10*3/uL (ref 0.0–0.1)
BASOS PCT: 0 % (ref 0–1)
EOS PCT: 1 % (ref 0–5)
Eosinophils Absolute: 0.1 10*3/uL (ref 0.0–0.7)
HCT: 39.7 % (ref 39.0–52.0)
HEMOGLOBIN: 13.1 g/dL (ref 13.0–17.0)
Lymphocytes Relative: 18 % (ref 12–46)
Lymphs Abs: 1.2 10*3/uL (ref 0.7–4.0)
MCH: 30.2 pg (ref 26.0–34.0)
MCHC: 33 g/dL (ref 30.0–36.0)
MCV: 91.5 fL (ref 78.0–100.0)
MONO ABS: 0.6 10*3/uL (ref 0.1–1.0)
Monocytes Relative: 8 % (ref 3–12)
Neutro Abs: 5.1 10*3/uL (ref 1.7–7.7)
Neutrophils Relative %: 73 % (ref 43–77)
PLATELETS: 188 10*3/uL (ref 150–400)
RBC: 4.34 MIL/uL (ref 4.22–5.81)
RDW: 13.9 % (ref 11.5–15.5)
WBC: 7 10*3/uL (ref 4.0–10.5)

## 2014-08-23 LAB — COMPREHENSIVE METABOLIC PANEL
ALBUMIN: 3.9 g/dL (ref 3.5–5.0)
ALT: 19 U/L (ref 17–63)
AST: 19 U/L (ref 15–41)
Alkaline Phosphatase: 61 U/L (ref 38–126)
Anion gap: 9 (ref 5–15)
BILIRUBIN TOTAL: 0.8 mg/dL (ref 0.3–1.2)
BUN: 15 mg/dL (ref 6–20)
CALCIUM: 8.9 mg/dL (ref 8.9–10.3)
CO2: 26 mmol/L (ref 22–32)
Chloride: 102 mmol/L (ref 101–111)
Creatinine, Ser: 1.02 mg/dL (ref 0.61–1.24)
GFR calc Af Amer: >60 mL/min (ref >60)
Glucose, Bld: 166 mg/dL — ABNORMAL HIGH (ref 65–99)
Potassium: 4.2 mmol/L (ref 3.5–5.1)
Sodium: 137 mmol/L (ref 135–145)
TOTAL PROTEIN: 7.3 g/dL (ref 6.5–8.1)

## 2014-08-23 LAB — CBG MONITORING, ED: Glucose-Capillary: 170 mg/dL — ABNORMAL HIGH (ref 65–99)

## 2014-08-23 LAB — GLUCOSE, CAPILLARY: Glucose-Capillary: 242 mg/dL — ABNORMAL HIGH (ref 65–99)

## 2014-08-23 MED ORDER — DOCUSATE SODIUM 100 MG PO CAPS
100.0000 mg | ORAL_CAPSULE | Freq: Two times a day (BID) | ORAL | Status: DC
  Administered 2014-08-24 – 2014-08-27 (×8): 100 mg via ORAL
  Filled 2014-08-23 (×8): qty 1

## 2014-08-23 MED ORDER — ACETAMINOPHEN 500 MG PO TABS: 1000.0000 mg | ORAL_TABLET | Freq: Four times a day (QID) | ORAL | Status: DC | PRN

## 2014-08-23 MED ORDER — PIPERACILLIN-TAZOBACTAM 3.375 G IVPB 30 MIN
3.3750 g | Freq: Once | INTRAVENOUS | Status: AC
  Administered 2014-08-24: 3.375 g via INTRAVENOUS
  Filled 2014-08-23 (×2): qty 50

## 2014-08-23 MED ORDER — INSULIN ASPART 100 UNIT/ML ~~LOC~~ SOLN
0.0000 [IU] | Freq: Three times a day (TID) | SUBCUTANEOUS | Status: DC
  Administered 2014-08-24: 7 [IU] via SUBCUTANEOUS
  Administered 2014-08-24 – 2014-08-25 (×3): 4 [IU] via SUBCUTANEOUS
  Administered 2014-08-25: 7 [IU] via SUBCUTANEOUS
  Administered 2014-08-26 (×2): 4 [IU] via SUBCUTANEOUS
  Administered 2014-08-26 – 2014-08-27 (×2): 7 [IU] via SUBCUTANEOUS
  Administered 2014-08-27: 4 [IU] via SUBCUTANEOUS

## 2014-08-23 MED ORDER — BUDESONIDE 0.5 MG/2ML IN SUSP
0.5000 mg | Freq: Two times a day (BID) | RESPIRATORY_TRACT | Status: DC | PRN
  Filled 2014-08-23: qty 2

## 2014-08-23 MED ORDER — DABIGATRAN ETEXILATE MESYLATE 150 MG PO CAPS
ORAL_CAPSULE | ORAL | Status: AC
  Filled 2014-08-23: qty 1

## 2014-08-23 MED ORDER — VANCOMYCIN HCL IN DEXTROSE 1-5 GM/200ML-% IV SOLN
1000.0000 mg | Freq: Once | INTRAVENOUS | Status: AC
  Administered 2014-08-23: 1000 mg via INTRAVENOUS
  Filled 2014-08-23: qty 200

## 2014-08-23 MED ORDER — TRAZODONE HCL 50 MG PO TABS: 50.0000 mg | ORAL_TABLET | Freq: Every evening | ORAL | Status: DC | PRN

## 2014-08-23 MED ORDER — ALBUTEROL SULFATE HFA 108 (90 BASE) MCG/ACT IN AERS
2.0000 | INHALATION_SPRAY | Freq: Four times a day (QID) | RESPIRATORY_TRACT | Status: DC | PRN
  Filled 2014-08-23: qty 6.7

## 2014-08-23 MED ORDER — AMMONIUM LACTATE 12 % EX LOTN
1.0000 "application " | TOPICAL_LOTION | CUTANEOUS | Status: DC | PRN
  Filled 2014-08-23: qty 400

## 2014-08-23 MED ORDER — FLUTICASONE PROPIONATE 50 MCG/ACT NA SUSP: 1.0000 | Freq: Every day | NASAL | Status: DC | PRN

## 2014-08-23 MED ORDER — MOMETASONE FUROATE 220 MCG/INH IN AEPB: 2.0000 | INHALATION_SPRAY | Freq: Two times a day (BID) | RESPIRATORY_TRACT | Status: DC | PRN

## 2014-08-23 MED ORDER — INSULIN ASPART 100 UNIT/ML ~~LOC~~ SOLN
0.0000 [IU] | Freq: Every day | SUBCUTANEOUS | Status: DC
  Administered 2014-08-23 – 2014-08-26 (×3): 2 [IU] via SUBCUTANEOUS

## 2014-08-23 MED ORDER — ATORVASTATIN CALCIUM 40 MG PO TABS
40.0000 mg | ORAL_TABLET | Freq: Every day | ORAL | Status: DC
  Administered 2014-08-24 – 2014-08-26 (×4): 40 mg via ORAL
  Filled 2014-08-23 (×4): qty 1

## 2014-08-23 MED ORDER — ALBUTEROL SULFATE (2.5 MG/3ML) 0.083% IN NEBU: 3.0000 mL | INHALATION_SOLUTION | Freq: Four times a day (QID) | RESPIRATORY_TRACT | Status: DC | PRN

## 2014-08-23 MED ORDER — CAPSAICIN 0.025 % EX CREA
1.0000 "application " | TOPICAL_CREAM | Freq: Two times a day (BID) | CUTANEOUS | Status: DC
  Administered 2014-08-24 – 2014-08-27 (×7): 1 via TOPICAL
  Filled 2014-08-23: qty 56.6

## 2014-08-23 MED ORDER — VANCOMYCIN HCL IN DEXTROSE 750-5 MG/150ML-% IV SOLN
750.0000 mg | Freq: Two times a day (BID) | INTRAVENOUS | Status: DC
  Administered 2014-08-24 – 2014-08-27 (×6): 750 mg via INTRAVENOUS
  Filled 2014-08-23 (×11): qty 150

## 2014-08-23 MED ORDER — PREGABALIN 75 MG PO CAPS
200.0000 mg | ORAL_CAPSULE | Freq: Three times a day (TID) | ORAL | Status: DC
  Administered 2014-08-24 – 2014-08-27 (×11): 200 mg via ORAL
  Filled 2014-08-23: qty 2
  Filled 2014-08-23: qty 1
  Filled 2014-08-23 (×3): qty 2
  Filled 2014-08-23 (×2): qty 1
  Filled 2014-08-23: qty 2
  Filled 2014-08-23: qty 1
  Filled 2014-08-23: qty 2
  Filled 2014-08-23 (×2): qty 1
  Filled 2014-08-23 (×2): qty 2
  Filled 2014-08-23: qty 1
  Filled 2014-08-23 (×2): qty 2
  Filled 2014-08-23: qty 1
  Filled 2014-08-23 (×2): qty 2
  Filled 2014-08-23 (×2): qty 1

## 2014-08-23 MED ORDER — ONDANSETRON HCL 4 MG PO TABS: 4.0000 mg | ORAL_TABLET | Freq: Four times a day (QID) | ORAL | Status: DC | PRN

## 2014-08-23 MED ORDER — OXYCODONE HCL 5 MG PO TABS
5.0000 mg | ORAL_TABLET | Freq: Three times a day (TID) | ORAL | Status: DC | PRN
  Administered 2014-08-24 – 2014-08-27 (×6): 5 mg via ORAL
  Filled 2014-08-23 (×6): qty 1

## 2014-08-23 MED ORDER — DABIGATRAN ETEXILATE MESYLATE 150 MG PO CAPS
150.0000 mg | ORAL_CAPSULE | Freq: Two times a day (BID) | ORAL | Status: DC
  Administered 2014-08-24 – 2014-08-27 (×8): 150 mg via ORAL
  Filled 2014-08-23 (×12): qty 1

## 2014-08-23 MED ORDER — PIPERACILLIN-TAZOBACTAM 3.375 G IVPB
3.3750 g | Freq: Three times a day (TID) | INTRAVENOUS | Status: DC
  Administered 2014-08-24 – 2014-08-26 (×7): 3.375 g via INTRAVENOUS
  Filled 2014-08-23 (×17): qty 50

## 2014-08-23 MED ORDER — SENNOSIDES-DOCUSATE SODIUM 8.6-50 MG PO TABS
2.0000 | ORAL_TABLET | Freq: Two times a day (BID) | ORAL | Status: DC
  Administered 2014-08-24 – 2014-08-27 (×8): 2 via ORAL
  Filled 2014-08-23 (×8): qty 2

## 2014-08-23 MED ORDER — FINASTERIDE 5 MG PO TABS
5.0000 mg | ORAL_TABLET | Freq: Every day | ORAL | Status: DC
  Administered 2014-08-24 – 2014-08-26 (×4): 5 mg via ORAL
  Filled 2014-08-23 (×6): qty 1

## 2014-08-23 MED ORDER — PANTOPRAZOLE SODIUM 40 MG PO TBEC
40.0000 mg | DELAYED_RELEASE_TABLET | Freq: Every day | ORAL | Status: DC
  Administered 2014-08-24 – 2014-08-27 (×5): 40 mg via ORAL
  Filled 2014-08-23 (×5): qty 1

## 2014-08-23 MED ORDER — METFORMIN HCL 500 MG PO TABS: 1000.0000 mg | ORAL_TABLET | Freq: Two times a day (BID) | ORAL | Status: DC

## 2014-08-23 MED ORDER — ONDANSETRON HCL 4 MG/2ML IJ SOLN: 4.0000 mg | Freq: Four times a day (QID) | INTRAMUSCULAR | Status: DC | PRN

## 2014-08-23 MED ORDER — METFORMIN HCL 500 MG PO TABS
1000.0000 mg | ORAL_TABLET | Freq: Two times a day (BID) | ORAL | Status: DC
  Administered 2014-08-24: 1000 mg via ORAL
  Filled 2014-08-23: qty 2

## 2014-08-23 MED ORDER — LEVOTHYROXINE SODIUM 100 MCG PO TABS
200.0000 ug | ORAL_TABLET | Freq: Every morning | ORAL | Status: DC
  Administered 2014-08-24 – 2014-08-27 (×4): 200 ug via ORAL
  Filled 2014-08-23 (×5): qty 2

## 2014-08-23 MED ORDER — PIPERACILLIN-TAZOBACTAM 3.375 G IVPB
INTRAVENOUS | Status: AC
  Filled 2014-08-23: qty 50

## 2014-08-23 NOTE — ED Provider Notes (Signed)
CSN: [id number]     Arrival date & time 08/23/14  1648 History   First MD Initiated Contact with Patient 08/23/14 1707     Chief Complaint  Patient presents with  . Cellulitis     (Consider location/radiation/quality/duration/timing/severity/associated sxs/prior Treatment) HPI..... Right lower extremity redness and swelling for several days. Patient has noted an infected fourth digit for several weeks. He was placed on Cipro by an urgent care center. Digit is now becoming more excoriated and draining pus. No fever, chills, sweats. He is diabetic. He typically goes to the [facility] system.  Past Medical History  Diagnosis Date  . Neuropathy   . COPD (chronic obstructive pulmonary disease)   . Diabetes mellitus   . Spinal stenosis   . Atrial fibrillation   . PTSD (post-traumatic stress disorder)   . Pacemaker    Past Surgical History  Procedure Laterality Date  . Cholecystectomy     Family History  Problem Relation Age of Onset  . Heart disease Mother   . Prostate cancer Brother   . Kidney cancer Brother    History  Substance Use Topics  . Smoking status: Never Smoker   . Smokeless tobacco: Never Used  . Alcohol Use: No    Review of Systems  All other systems reviewed and are negative.     Allergies  Terazosin  Home Medications   Prior to Admission medications   Medication Sig Start Date End Date Taking? Authorizing Provider  acetaminophen (TYLENOL) 500 MG tablet Take 1,000 mg by mouth every 6 (six) hours as needed for mild pain.   Yes Historical Provider, MD  albuterol (PROVENTIL HFA;VENTOLIN HFA) 108 (90 BASE) MCG/ACT inhaler Inhale 2 puffs into the lungs every 6 (six) hours as needed. Shortness of breath    Yes Historical Provider, MD  ammonium lactate (LAC-HYDRIN) 12 % lotion Apply 1 application topically as needed for dry skin.   Yes Historical Provider, MD  atorvastatin (LIPITOR) 80 MG tablet Take 40 mg by mouth daily.   Yes Historical Provider, MD   capsaicin (ZOSTRIX) 0.025 % cream Apply 1 application topically 2 (two) times daily.   Yes Historical Provider, MD  dabigatran (PRADAXA) 150 MG CAPS capsule Take 150 mg by mouth every 12 (twelve) hours.   Yes Historical Provider, MD  diphenhydramine-acetaminophen (TYLENOL PM) 25-500 MG TABS Take 2 tablets by mouth at bedtime.   Yes Historical Provider, MD  docusate sodium (COLACE) 100 MG capsule Take 100 mg by mouth 2 (two) times daily.   Yes Historical Provider, MD  finasteride (PROSCAR) 5 MG tablet Take 5 mg by mouth at bedtime.    Yes Historical Provider, MD  fluticasone (FLONASE) 50 MCG/ACT nasal spray Place 1 spray into both nostrils daily as needed for allergies or rhinitis.   Yes Historical Provider, MD  levothyroxine (SYNTHROID, LEVOTHROID) 100 MCG tablet Take 200 mcg by mouth every morning.    Yes Historical Provider, MD  metFORMIN (GLUCOPHAGE) 500 MG tablet Take 1,000 mg by mouth 2 (two) times daily.    Yes Historical Provider, MD  mometasone [facility]) 220 MCG/INH inhaler Inhale 2 puffs into the lungs 2 (two) times daily as needed. May use at home medication   Yes Historical Provider, MD  omeprazole (PRILOSEC) 20 MG capsule Take 20 mg by mouth at bedtime.    Yes Historical Provider, MD  oxyCODONE (OXY IR/ROXICODONE) 5 MG immediate release tablet Take 5 mg by mouth 3 (three) times daily as needed for moderate pain.    Yes  Historical Provider, MD  pregabalin (LYRICA) 100 MG capsule Take 200 mg by mouth 3 (three) times daily.   Yes Historical Provider, MD  sennosides-docusate sodium (SENOKOT-S) 8.6-50 MG tablet Take 2 tablets by mouth 2 (two) times daily.   Yes Historical Provider, MD  traZODone (DESYREL) 50 MG tablet Take 50 mg by mouth at bedtime as needed for sleep.    Yes Historical Provider, MD  sulfamethoxazole-trimethoprim (SEPTRA DS) 800-160 MG per tablet Take 1 tablet by mouth every 12 (twelve) hours. Patient not taking: Reported on 08/23/2014 05/28/14   Orpah Greek, MD   BP  114/80 mmHg  Pulse 108  Temp(Src) 98.5 F (36.9 C) (Oral)  Resp 18  Ht 6\' 1"  (1.854 m)  Wt 232 lb (105.235 kg)  BMI 30.62 kg/m2  SpO2 99% Physical Exam  Constitutional: He is oriented to person, place, and time. He appears well-developed and well-nourished.  HENT:  Head: Normocephalic and atraumatic.  Eyes: Conjunctivae and EOM are normal. Pupils are equal, round, and reactive to light.  Neck: Normal range of motion. Neck supple.  Cardiovascular: Normal rate and regular rhythm.   Pulmonary/Chest: Effort normal and breath sounds normal.  Abdominal: Soft. Bowel sounds are normal.  Musculoskeletal: Normal range of motion.  Neurological: He is alert and oriented to person, place, and time.  Skin:  Right lower extremity: Erythema and, tenderness, swelling from the knee distally. Fourth toe is excoriated with erythema and exudate both dorsal and plantar aspect  Psychiatric: He has a normal mood and affect. His behavior is normal.  Nursing note and vitals reviewed.   ED Course  Procedures (including critical care time) Labs Review Labs Reviewed  COMPREHENSIVE METABOLIC PANEL - Abnormal; Notable for the following:    Glucose, Bld 166 (*)    All other components within normal limits  CBG MONITORING, ED - Abnormal; Notable for the following:    Glucose-Capillary 170 (*)    All other components within normal limits  CBC WITH DIFFERENTIAL/PLATELET    Imaging Review No results found.   EKG Interpretation None      MDM   Final diagnoses:  Cellulitis of right lower extremity    Patient is a diabetic with a cellulitic right lower extremity, especially the fourth toe. IV vancomycin. Admit to general medicine.    Nat Christen, MD 08/23/14 [phone]

## 2014-08-23 NOTE — Progress Notes (Signed)
Pharmacy Note:  Initial antibiotics for Vancomycin ordered by EDP for cellulitis .  CrCl cannot be calculated (Patient has no serum creatinine result on file.).   Allergies  Allergen Reactions  . Terazosin Other (See Comments)    Patient is not familiar with this medication as an allergy    Filed Vitals:   08/23/14 1656  BP: 114/80  Pulse: 108  Temp: 98.5 F (36.9 C)  Resp: 18    Anti-infectives    None      Plan: Initial doses of Vancomycin 2gm X 1 ordered. F/U admission orders for further dosing if therapy continued.  Pricilla Larsson, [facility] 08/23/2014 5:57 PM

## 2014-08-23 NOTE — Progress Notes (Signed)
ANTIBIOTIC CONSULT NOTE - FOLLOW UP  Pharmacy Consult for Vancomycin and Zosyn  Indication: cellulitis   Allergies  Allergen Reactions  . Terazosin Other (See Comments)    Patient is not familiar with this medication as an allergy   Patient Measurements: Height: 6\' 1"  (185.4 cm) Weight: 232 lb (105.235 kg) IBW/kg (Calculated) : 79.9  Vital Signs: Temp: 98.3 F (36.8 C) (07/05 1931) Temp Source: Oral (07/05 1931) BP: 94/63 mmHg (07/05 1931) Pulse Rate: 69 (07/05 1931) Intake/Output from previous day:   Intake/Output from this shift:    Labs:  Recent Labs  08/23/14 1718  WBC 7.0  HGB 13.1  PLT 188  CREATININE 1.02   Estimated Creatinine Clearance: 87 mL/min (by C-G formula based on Cr of 1.02). No results for input(s): VANCOTROUGH, VANCOPEAK, VANCORANDOM, GENTTROUGH, GENTPEAK, GENTRANDOM, TOBRATROUGH, TOBRAPEAK, TOBRARND, AMIKACINPEAK, AMIKACINTROU, AMIKACIN in the last 72 hours.   Microbiology: No results found for this or any previous visit (from the past 720 hour(s)).  Anti-infectives    Start     Dose/Rate Route Frequency Ordered Stop   08/23/14 2030  piperacillin-tazobactam (ZOSYN) IVPB 3.375 g     3.375 g 100 mL/hr over 30 Minutes Intravenous  Once 08/23/14 2015     08/23/14 1900  vancomycin (VANCOCIN) IVPB 1000 mg/200 mL premix     1,000 mg 200 mL/hr over 60 Minutes Intravenous  Once 08/23/14 1759     08/23/14 1800  vancomycin (VANCOCIN) IVPB 1000 mg/200 mL premix     1,000 mg 200 mL/hr over 60 Minutes Intravenous  Once 08/23/14 1759 08/23/14 1906     Assessment: Okay for Protocol, initial doses ordered in ED.  Recent treatment with PO Ciprofloxacin.  Obesity/Normalized CrCl Vancomycin dosing protocol will be initiated with an estimated normalized CrCl = 69.5 ml/min.  Goal of Therapy:  Vancomycin trough level 10-15 mcg/ml  Plan:  Zosyn 3.375gm IV every 8 hours. Follow-up micro data, labs, vitals.  Vancomycin 750mg  IV every 12 hours Measure  antibiotic drug levels at steady state Follow up culture results  Pricilla Larsson 08/23/2014,8:16 PM

## 2014-08-23 NOTE — ED Notes (Signed)
Tony [name], states to hold patient in ED until I hear back from her.

## 2014-08-23 NOTE — ED Notes (Signed)
PT c/o right foot 4th digit swelling and nacrotic tissue noted with drainage x1 week with redness and swelling noted to right calf.

## 2014-08-23 NOTE — ED Notes (Signed)
Pt okay to go to [address] per [place], [facility].

## 2014-08-23 NOTE — H&P (Signed)
[facility] History and Physical  STEDMAN SUMMERVILLE YIR:[id number] DOB: [birth year] DOA: 08/23/2014  Referring physician: ER, Dr. Lacinda Axon PCP: [facility]   Chief Complaint: Redness right lower extremity  HPI: Tony [name] is a 69 y.o. male  This is a 69 year old man, diabetic with neuropathy, history of atrial fibrillation, resent with infected right foot and right lower extremity cellulitis. He started to have drainage of pus and redness in the right foot 2 weeks ago. He went to an urgent care center and was placed on ciprofloxacin. He took the whole course and seemed to improve but when he finished the course, the foot and right lower leg became worse with redness progressing. He went to the urgent care again today and was told to come to the emergency room for admission. He denies fever. There is no nausea, vomiting, abdominal pain, chest pain, dyspnea or dizziness. He is now being admitted for further management.   Review of Systems:  Apart from symptoms above, all systems negative.  Past Medical History  Diagnosis Date  . Neuropathy   . COPD (chronic obstructive pulmonary disease)   . Diabetes mellitus   . Spinal stenosis   . Atrial fibrillation   . PTSD (post-traumatic stress disorder)   . Pacemaker    Past Surgical History  Procedure Laterality Date  . Cholecystectomy     Social History:  reports that he has never smoked. He has never used smokeless tobacco. He reports that he does not drink alcohol or use illicit drugs.  Allergies  Allergen Reactions  . Terazosin Other (See Comments)    Patient is not familiar with this medication as an allergy    Family History  Problem Relation Age of Onset  . Heart disease Mother   . Prostate cancer Brother   . Kidney cancer Brother     Prior to Admission medications   Medication Sig Start Date End Date Taking? Authorizing Provider  acetaminophen (TYLENOL) 500 MG tablet Take 1,000 mg by mouth every 6 (six) hours  as needed for mild pain.   Yes Historical Provider, MD  albuterol (PROVENTIL HFA;VENTOLIN HFA) 108 (90 BASE) MCG/ACT inhaler Inhale 2 puffs into the lungs every 6 (six) hours as needed. Shortness of breath    Yes Historical Provider, MD  ammonium lactate (LAC-HYDRIN) 12 % lotion Apply 1 application topically as needed for dry skin.   Yes Historical Provider, MD  atorvastatin (LIPITOR) 80 MG tablet Take 40 mg by mouth daily.   Yes Historical Provider, MD  capsaicin (ZOSTRIX) 0.025 % cream Apply 1 application topically 2 (two) times daily.   Yes Historical Provider, MD  dabigatran (PRADAXA) 150 MG CAPS capsule Take 150 mg by mouth every 12 (twelve) hours.   Yes Historical Provider, MD  diphenhydramine-acetaminophen (TYLENOL PM) 25-500 MG TABS Take 2 tablets by mouth at bedtime.   Yes Historical Provider, MD  docusate sodium (COLACE) 100 MG capsule Take 100 mg by mouth 2 (two) times daily.   Yes Historical Provider, MD  finasteride (PROSCAR) 5 MG tablet Take 5 mg by mouth at bedtime.    Yes Historical Provider, MD  fluticasone (FLONASE) 50 MCG/ACT nasal spray Place 1 spray into both nostrils daily as needed for allergies or rhinitis.   Yes Historical Provider, MD  levothyroxine (SYNTHROID, LEVOTHROID) 100 MCG tablet Take 200 mcg by mouth every morning.    Yes Historical Provider, MD  metFORMIN (GLUCOPHAGE) 500 MG tablet Take 1,000 mg by mouth 2 (two) times daily.  Yes Historical Provider, MD  mometasone [facility]) 220 MCG/INH inhaler Inhale 2 puffs into the lungs 2 (two) times daily as needed. May use at home medication   Yes Historical Provider, MD  omeprazole (PRILOSEC) 20 MG capsule Take 20 mg by mouth at bedtime.    Yes Historical Provider, MD  oxyCODONE (OXY IR/ROXICODONE) 5 MG immediate release tablet Take 5 mg by mouth 3 (three) times daily as needed for moderate pain.    Yes Historical Provider, MD  pregabalin (LYRICA) 100 MG capsule Take 200 mg by mouth 3 (three) times daily.   Yes Historical  Provider, MD  sennosides-docusate sodium (SENOKOT-S) 8.6-50 MG tablet Take 2 tablets by mouth 2 (two) times daily.   Yes Historical Provider, MD  traZODone (DESYREL) 50 MG tablet Take 50 mg by mouth at bedtime as needed for sleep.    Yes Historical Provider, MD  sulfamethoxazole-trimethoprim (SEPTRA DS) 800-160 MG per tablet Take 1 tablet by mouth every 12 (twelve) hours. Patient not taking: Reported on 08/23/2014 05/28/14   Orpah Greek, MD   Physical Exam: Filed Vitals:   08/23/14 1656 08/23/14 1931  BP: 114/80 94/63  Pulse: 108 69  Temp: 98.5 F (36.9 C) 98.3 F (36.8 C)  TempSrc: Oral Oral  Resp: 18 20  Height: 6\' 1"  (1.854 m)   Weight: 105.235 kg (232 lb)   SpO2: 99% 99%    Wt Readings from Last 3 Encounters:  08/23/14 105.235 kg (232 lb)  05/28/14 103.42 kg (228 lb)  01/04/14 99.791 kg (220 lb)    General:  Appears calm and comfortable. He does not look toxic or septic. Eyes: PERRL, normal lids, irises & conjunctiva ENT: grossly normal hearing, lips & tongue Neck: no LAD, masses or thyromegaly Cardiovascular: RRR, no m/r/g. No LE edema. Telemetry: SR, no arrhythmias  Respiratory: CTA bilaterally, no w/r/r. Normal respiratory effort. Abdomen: soft, ntnd Skin: He has cellulitis in the right lower leg and worse in the right foot especially the fourth toe, where there is erythema and pus exuding. Musculoskeletal: grossly normal tone BUE/BLE Psychiatric: grossly normal mood and affect, speech fluent and appropriate Neurologic: grossly non-focal.          Labs on Admission:  Basic Metabolic Panel:  Recent Labs Lab 08/23/14 1718  NA 137  K 4.2  CL 102  CO2 26  GLUCOSE 166*  BUN 15  CREATININE 1.02  CALCIUM 8.9   Liver Function Tests:  Recent Labs Lab 08/23/14 1718  AST 19  ALT 19  ALKPHOS 61  BILITOT 0.8  PROT 7.3  ALBUMIN 3.9   No results for input(s): LIPASE, AMYLASE in the last 168 hours. No results for input(s): AMMONIA in the last 168  hours. CBC:  Recent Labs Lab 08/23/14 1718  WBC 7.0  NEUTROABS 5.1  HGB 13.1  HCT 39.7  MCV 91.5  PLT 188   Cardiac Enzymes: No results for input(s): CKTOTAL, CKMB, CKMBINDEX, TROPONINI in the last 168 hours.  BNP (last 3 results)  Recent Labs  05/28/14 1719  BNP 174.0*    ProBNP (last 3 results) No results for input(s): PROBNP in the last 8760 hours.  CBG:  Recent Labs Lab 08/23/14 1727  GLUCAP 170*    Radiological Exams on Admission: No results found.    Assessment/Plan   1. Cellulitis of the right lower extremity. I'm also concerned he may have all shown myelitis affecting the toes and the right foot. I will obtain x-ray of the right foot. He will be  treated with broad-spectrum antibiotics with intravenous vancomycin and intravenous Zosyn. He may need further investigation for osteomyelitis depending on x-ray findings. Wound care consultation. 2. Diabetes. Continue with home medications and sliding scale. 3. Atrial fibrillation. He is on chronic and correlation therapy. Continue with therapy.  Further recommendations will depend on patient's hospital progress.   Code Status: Full code.  DVT Prophylaxis: Chronic anticoagulation.  Family Communication: I discussed the plan with the patient at the bedside.   Disposition Plan: Home when medically stable.   Time spent: 45 minutes.  Doree Albee [facility] Pager [phone].

## 2014-08-24 ENCOUNTER — Encounter (HOSPITAL_COMMUNITY): Payer: Self-pay | Admitting: *Deleted

## 2014-08-24 DIAGNOSIS — E119 Type 2 diabetes mellitus without complications: Secondary | ICD-10-CM

## 2014-08-24 DIAGNOSIS — L03115 Cellulitis of right lower limb: Principal | ICD-10-CM

## 2014-08-24 DIAGNOSIS — I482 Chronic atrial fibrillation: Secondary | ICD-10-CM

## 2014-08-24 LAB — COMPREHENSIVE METABOLIC PANEL
ALT: 18 U/L (ref 17–63)
ANION GAP: 9 (ref 5–15)
AST: 19 U/L (ref 15–41)
Albumin: 3.5 g/dL (ref 3.5–5.0)
Alkaline Phosphatase: 54 U/L (ref 38–126)
BUN: 15 mg/dL (ref 6–20)
CO2: 29 mmol/L (ref 22–32)
CREATININE: 1.07 mg/dL (ref 0.61–1.24)
Calcium: 8.7 mg/dL — ABNORMAL LOW (ref 8.9–10.3)
Chloride: 100 mmol/L — ABNORMAL LOW (ref 101–111)
GFR calc Af Amer: >60 mL/min (ref >60)
GFR calc non Af Amer: >60 mL/min (ref >60)
Glucose, Bld: 160 mg/dL — ABNORMAL HIGH (ref 65–99)
Potassium: 4 mmol/L (ref 3.5–5.1)
SODIUM: 138 mmol/L (ref 135–145)
TOTAL PROTEIN: 6.7 g/dL (ref 6.5–8.1)
Total Bilirubin: 1.1 mg/dL (ref 0.3–1.2)

## 2014-08-24 LAB — CBC
HCT: 37.7 % — ABNORMAL LOW (ref 39.0–52.0)
Hemoglobin: 12 g/dL — ABNORMAL LOW (ref 13.0–17.0)
MCH: 29.6 pg (ref 26.0–34.0)
MCHC: 31.8 g/dL (ref 30.0–36.0)
MCV: 92.9 fL (ref 78.0–100.0)
PLATELETS: 202 10*3/uL (ref 150–400)
RBC: 4.06 MIL/uL — ABNORMAL LOW (ref 4.22–5.81)
RDW: 14 % (ref 11.5–15.5)
WBC: 6.1 10*3/uL (ref 4.0–10.5)

## 2014-08-24 LAB — GLUCOSE, CAPILLARY
GLUCOSE-CAPILLARY: 174 mg/dL — AB (ref 65–99)
GLUCOSE-CAPILLARY: 239 mg/dL — AB (ref 65–99)
GLUCOSE-CAPILLARY: 251 mg/dL — AB (ref 65–99)
Glucose-Capillary: 159 mg/dL — ABNORMAL HIGH (ref 65–99)

## 2014-08-24 MED ORDER — PIPERACILLIN-TAZOBACTAM 3.375 G IVPB
INTRAVENOUS | Status: AC
  Filled 2014-08-24: qty 50

## 2014-08-24 MED ORDER — ACETAMINOPHEN 325 MG PO TABS: 650.0000 mg | ORAL_TABLET | Freq: Four times a day (QID) | ORAL | Status: DC | PRN

## 2014-08-24 MED ORDER — FINASTERIDE 5 MG PO TABS
ORAL_TABLET | ORAL | Status: AC
  Filled 2014-08-24: qty 1

## 2014-08-24 MED ORDER — VANCOMYCIN HCL IN DEXTROSE 750-5 MG/150ML-% IV SOLN
INTRAVENOUS | Status: AC
  Filled 2014-08-24: qty 150

## 2014-08-24 NOTE — Progress Notes (Signed)
Inpatient Diabetes Program Recommendations  AACE/ADA: New Consensus Statement on Inpatient Glycemic Control (2013)  Target Ranges:  Prepandial:   less than 140 mg/dL      Peak postprandial:   less than 180 mg/dL (1-2 hours)      Critically ill patients:  140 - 180 mg/dL   Results for Tony [name], Tony [name] (MRN [id number]) as of 08/24/2014 14:45  Ref. Range 08/23/2014 17:27 08/23/2014 21:45 08/24/2014 07:45 08/24/2014 11:30  Glucose-Capillary Latest Ref Range: 65-99 mg/dL 170 (H) 242 (H) 174 (H) 239 (H)   Diabetes history: Type 2 diabetes Outpatient Diabetes medications:  Metformin 1000 mg bid,  Current orders for Inpatient glycemic control:  Metformin 1000 mg bid, Novolog resistant tid with meals and HS  Note that CBG's are greater than goal.  Please consider checking A1C to determine pre-hospitalization glycemic control.  Also consider adding basal insulin while patient is in the hospital.  Consider Levemir 20 units daily.  Thanks, Adah Perl, RN, BC-ADM Inpatient Diabetes Coordinator Pager [phone] (8a-5p)

## 2014-08-24 NOTE — Care Management Note (Signed)
Case Management Note  Patient Details  Name: Tony [name] MRN: [id number] Date of Birth: 05-08-1945  Subjective/Objective:                  Pt admitted from home with cellulitis. Pt lives with his wife and will return home at discharge. Pt has a quad cane, walker, and electric scooter for home use. [facility].  Action[facility]Plan: [facility]. Will continue to follow for discharge planning needs.  Expected Discharge Date:                  Expected Discharge Plan:  Home/Self Care  In-House Referral:  NA  Discharge planning Services  CM Consult  Post Acute Care Choice:  NA Choice offered to:  NA  DME Arranged:    DME Agency:     HH Arranged:    HH Agency:     Status of Service:  In process, will continue to follow  Medicare Important Message Given:    Date Medicare IM Given:    Medicare IM give by:    Date Additional Medicare IM Given:    Additional Medicare Important Message give by:     If discussed at [place] of Stay Meetings, dates discussed:    Additional Comments:  [name], RN 08/24/2014, 3:15 PM

## 2014-08-24 NOTE — Progress Notes (Signed)
PROGRESS NOTE  Tony [name] URK:[id number] DOB: 05/01/1945 DOA: 08/23/2014 PCP: [place]  Summary: 69 year old man with history of diabetes mellitus with neuropathy with history of 2 weeks of redness and drainage from the right foot, treated with ciprofloxacin with some improvement. However after finishing a course of Cipro, he developed increasing foot and leg redness and was admitted for further treatment.  Assessment/Plan: 1. Cellulitis right fourth toe, foot, lower leg. No plain film evidence of osteomyelitis. 2. Diabetes mellitus with peripheral neuropathy. Capillary blood sugar stable. 3. Atrial fibrillation on dabigatran 4. Pacemaker placement   Continue IV antibiotics, monitor for improvement  Code Status: full code DVT prophylaxis: on Pradaxa for afib Family Communication: none present. Patient alert and understands plan. Disposition Plan: home  Murray Hodgkins, MD  [facility]  Pager [phone] If 7PM-7AM, please contact night-coverage at [email or web address], password [facility] 08/24/2014, 3:07 PM  LOS: 1 day   Consultants:    Procedures:    Antibiotics:  Vancomycin 7/6 >>  Zosyn 7/6 >>  HPI/Subjective: Right foot is improving. No pain. Has peripheral neuropathy.  Objective: Filed Vitals:   08/23/14 1656 08/23/14 1931 08/23/14 2119 08/24/14 0552  BP: 114/80 94/63 99/67  110/64  Pulse: 108 69 74 72  Temp: 98.5 F (36.9 C) 98.3 F (36.8 C) 98.2 F (36.8 C) 98.4 F (36.9 C)  TempSrc: Oral Oral Oral Oral  Resp: 18 20 20 21   Height: 6\' 1"  (1.854 m)  6\' 1"  (1.854 m)   Weight: 105.235 kg (232 lb)  108.682 kg (239 lb 9.6 oz)   SpO2: 99% 99% 100% 98%    Intake/Output Summary (Last 24 hours) at 08/24/14 1507 Last data filed at 08/24/14 1408  Gross per 24 hour  Intake    580 ml  Output      7 ml  Net    573 ml     Filed Weights   08/23/14 1656 08/23/14 2119  Weight: 105.235 kg (232 lb) 108.682 kg (239 lb 9.6 oz)    Exam:      Afebrile, VSS, no hypoxia General:  Appears calm and comfortable Cardiovascular: RRR, no m/r/g. No LLE edema. Right lower extremity edema 2-3 plus. Respiratory: CTA bilaterally, no w/r/r. Normal respiratory effort. Abdomen: soft, ntnd Skin: Right fourth toe significant erythema, swelling, large ulcer at the tip. 2-3 plus edema from foot up to the knee. Perfusion appears grossly intact. Dorsalis pedis pulse palpable. Musculoskeletal: grossly normal tone BUE/BLE Psychiatric: grossly normal mood and affect, speech fluent and appropriate  New data reviewed:  Complete metabolic panel unremarkable  CBC unremarkable  Pertinent data since admission:  X-rays right foot: No acute bony abnormalities.  Pending data:    Scheduled Meds: . atorvastatin  40 mg Oral q1800  . capsaicin  1 application Topical BID  . dabigatran  150 mg Oral Q12H  . docusate sodium  100 mg Oral BID  . finasteride  5 mg Oral QHS  . insulin aspart  0-20 Units Subcutaneous TID WC  . insulin aspart  0-5 Units Subcutaneous QHS  . levothyroxine  200 mcg Oral q morning - 10a  . metFORMIN  1,000 mg Oral BID WC  . pantoprazole  40 mg Oral Daily  . piperacillin-tazobactam (ZOSYN)  IV  3.375 g Intravenous Q8H  . pregabalin  200 mg Oral TID  . senna-docusate  2 tablet Oral BID  . vancomycin  750 mg Intravenous Q12H   Continuous Infusions:   Principal Problem:   Cellulitis of right lower  extremity Active Problems:   A-fib   Diabetes type 2, controlled   Time spent 20 minutes

## 2014-08-24 NOTE — Consult Note (Signed)
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  Wound bed: pink, but dirty. The feet are dirty and their is dog hair in the wound bed. No fluctuance, no drainage with palpation. Drainage (amount, consistency, odor) no dressing noted at the time of my visit  Periwound: intact, the right foot is more swollen than the left.  Dressing procedure/placement/frequency: Silver hydrofiber to absorb exudate, treat bioburdan, change every other day.   Recommend follow up with podiatry for long term management of foot ulcerations.   Discussed POC with patient and bedside nurse.  Re consult if needed, will not follow at this time. Thanks  [name], [place] [phone])

## 2014-08-25 LAB — GLUCOSE, CAPILLARY
GLUCOSE-CAPILLARY: 171 mg/dL — AB (ref 65–99)
GLUCOSE-CAPILLARY: 212 mg/dL — AB (ref 65–99)
GLUCOSE-CAPILLARY: 193 mg/dL — AB (ref 65–99)
Glucose-Capillary: 184 mg/dL — ABNORMAL HIGH (ref 65–99)

## 2014-08-25 MED FILL — Capsaicin Cream 0.025%: CUTANEOUS | Qty: 56.6 | Status: AC

## 2014-08-25 NOTE — Progress Notes (Signed)
PROGRESS NOTE  BASEM YANNUZZI XJO:[id number] DOB: 04-19-1945 DOA: 08/23/2014 PCP: [place]  Summary: 69 year old man with history of diabetes mellitus with neuropathy with history of 2 weeks of redness and drainage from the right foot, treated with ciprofloxacin with some improvement. However after finishing a course of Cipro, he developed increasing foot and leg redness and was admitted for further treatment.  Assessment/Plan: 1. Cellulitis right fourth toe, foot, lower leg. Slowly improving. Appreciate wound care recommendations. No plain film evidence of osteomyelitis. 2. Diabetes mellitus with peripheral neuropathy. Blood sugars remained stable 3. Atrial fibrillation on dabigatran 4. Pacemaker placement   Overall improving. Plan to continue IV antibiotics.   Wound care recommendations: Silver hydrofiber to absorb exudate, treat bioburdan, change every other day.   Recommend follow up with podiatry for long term management of foot ulcerations.    Code Status: full code DVT prophylaxis: on Pradaxa for afib Family Communication: none present. Patient alert and understands plan, discussed again 7/7. Disposition Plan: home  Murray Hodgkins, MD  [facility]  Pager [phone] If 7PM-7AM, please contact night-coverage at [email or web address], password [facility] 08/25/2014, 10:01 AM  LOS: 2 days   Consultants:    Procedures:    Antibiotics:  Vancomycin 7/6 >>  Zosyn 7/6 >>  HPI/Subjective: Overall feeling better, less swelling in the right leg.  Objective: Filed Vitals:   08/24/14 1602 08/24/14 2237 08/24/14 2238 08/25/14 0548  BP: 115/74 95/45 88/49  93/57  Pulse: 80 60  60  Temp: 98.2 F (36.8 C) 99.1 F (37.3 C)  98.8 F (37.1 C)  TempSrc:  Oral  Oral  Resp: 20 20  20   Height:      Weight:      SpO2: 98% 100%  99%    Intake/Output Summary (Last 24 hours) at 08/25/14 1001 Last data filed at 08/25/14 0900  Gross per 24 hour  Intake   1300 ml    Output      0 ml  Net   1300 ml     Filed Weights   08/23/14 1656 08/23/14 2119  Weight: 105.235 kg (232 lb) 108.682 kg (239 lb 9.6 oz)    Exam:    Afebrile, vital signs are stable, no hypoxia. General: Appears calm and comfortable Cardiovascular: RRR, no m/r/g. No LLE edema. Decreased right lower extremity edema, 2+, below the knee to the foot. Respiratory: CTA bilaterally, no w/r/r. Normal respiratory effort. Skin: There is decreased edema of the right lower leg and decreased erythema as well. The right foot is dressed. Psychiatric: grossly normal mood and affect, speech fluent and appropriate  New data reviewed: None  Pertinent data since admission:  X-rays right foot: No acute bony abnormalities.  Pending data:    Scheduled Meds: . atorvastatin  40 mg Oral q1800  . capsaicin  1 application Topical BID  . dabigatran  150 mg Oral Q12H  . docusate sodium  100 mg Oral BID  . finasteride  5 mg Oral QHS  . insulin aspart  0-20 Units Subcutaneous TID WC  . insulin aspart  0-5 Units Subcutaneous QHS  . levothyroxine  200 mcg Oral q morning - 10a  . pantoprazole  40 mg Oral Daily  . piperacillin-tazobactam (ZOSYN)  IV  3.375 g Intravenous Q8H  . pregabalin  200 mg Oral TID  . senna-docusate  2 tablet Oral BID  . vancomycin  750 mg Intravenous Q12H   Continuous Infusions:   Principal Problem:   Cellulitis of right lower extremity Active  Problems:   A-fib   Diabetes type 2, controlled   Time spent 15 minutes

## 2014-08-26 LAB — GLUCOSE, CAPILLARY
GLUCOSE-CAPILLARY: 164 mg/dL — AB (ref 65–99)
GLUCOSE-CAPILLARY: 239 mg/dL — AB (ref 65–99)
Glucose-Capillary: 154 mg/dL — ABNORMAL HIGH (ref 65–99)
Glucose-Capillary: 231 mg/dL — ABNORMAL HIGH (ref 65–99)

## 2014-08-26 LAB — VANCOMYCIN, TROUGH: Vancomycin Tr: 11 ug/mL (ref 10.0–20.0)

## 2014-08-26 LAB — BASIC METABOLIC PANEL
Anion gap: 8 (ref 5–15)
BUN: 15 mg/dL (ref 6–20)
CHLORIDE: 101 mmol/L (ref 101–111)
CO2: 29 mmol/L (ref 22–32)
CREATININE: 1.07 mg/dL (ref 0.61–1.24)
Calcium: 8.9 mg/dL (ref 8.9–10.3)
Glucose, Bld: 172 mg/dL — ABNORMAL HIGH (ref 65–99)
POTASSIUM: 4 mmol/L (ref 3.5–5.1)
Sodium: 138 mmol/L (ref 135–145)

## 2014-08-26 MED ORDER — PIPERACILLIN-TAZOBACTAM 3.375 G IVPB
3.3750 g | Freq: Three times a day (TID) | INTRAVENOUS | Status: DC
  Administered 2014-08-26 – 2014-08-27 (×3): 3.375 g via INTRAVENOUS
  Filled 2014-08-26 (×9): qty 50

## 2014-08-26 NOTE — Progress Notes (Signed)
PROGRESS NOTE  Tony [name] XBL:[id number] DOB: [birth year] DOA: 08/23/2014 PCP: [place]  Summary: 69 year old man with history of diabetes mellitus with neuropathy with history of 2 weeks of redness and drainage from the right foot, treated with ciprofloxacin with some improvement. However after finishing a course of Cipro, he developed increasing foot and leg redness and was admitted for further treatment.  Assessment/Plan: 1. Cellulitis right fourth toe, foot, lower leg. Rapidly improving. Appreciate wound care recommendations. No plain film evidence of osteomyelitis. 2. Diabetes mellitus with peripheral neuropathy. Blood sugars stable 3. Atrial fibrillation on dabigatran 4. Pacemaker placement   Continues to improve, continue IV abx today, change to PO in AM  Wound care recommendations: Silver hydrofiber to absorb exudate, treat bioburdan, change every other day.   Recommend follow up with podiatry for long term management of foot ulcerations.   Anticipate home 7/9.   Code Status: full code DVT prophylaxis: on Pradaxa for afib Family Communication: none present. Patient alert and understands plan, discussed again 7/8. Disposition Plan: home  Murray Hodgkins, MD  [facility]  Pager [phone] If 7PM-7AM, please contact night-coverage at [email or web address], password [facility] 08/26/2014, 12:12 PM  LOS: 3 days   Consultants:    Procedures:    Antibiotics:  Vancomycin 7/6 >>  Zosyn 7/6 >>  HPI/Subjective: Overall feeling better. Less swelling. Leg less tight.  Objective: Filed Vitals:   08/24/14 2238 08/25/14 0548 08/25/14 2114 08/26/14 0535  BP: 88/49 93/57 114/68 114/60  Pulse:  60 70 69  Temp:  98.8 F (37.1 C) 98.2 F (36.8 C) 98.6 F (37 C)  TempSrc:  Oral Oral Oral  Resp:  20 20 18   Height:      Weight:      SpO2:  99% 100% 98%    Intake/Output Summary (Last 24 hours) at 08/26/14 1212 Last data filed at 08/26/14 0824  Gross per 24  hour  Intake   1830 ml  Output      0 ml  Net   1830 ml     Filed Weights   08/23/14 1656 08/23/14 2119  Weight: 105.235 kg (232 lb) 108.682 kg (239 lb 9.6 oz)    Exam:    General:  Appears comfortable, calm. Cardiovascular: Regular rate and rhythm, no murmur, rub or gallop.  Respiratory: Clear to auscultation bilaterally, no wheezes, rales or rhonchi. Normal respiratory effort. Skin: much less edema, less erythema Right lower leg, foot appears better, bandaged. Psychiatric: grossly normal mood and affect, speech fluent and appropriate  New data reviewed:  BMP stable  Pertinent data since admission:  X-rays right foot: No acute bony abnormalities.  Pending data:    Scheduled Meds: . atorvastatin  40 mg Oral q1800  . capsaicin  1 application Topical BID  . dabigatran  150 mg Oral Q12H  . docusate sodium  100 mg Oral BID  . finasteride  5 mg Oral QHS  . insulin aspart  0-20 Units Subcutaneous TID WC  . insulin aspart  0-5 Units Subcutaneous QHS  . levothyroxine  200 mcg Oral q morning - 10a  . pantoprazole  40 mg Oral Daily  . piperacillin-tazobactam (ZOSYN)  IV  3.375 g Intravenous Q8H  . pregabalin  200 mg Oral TID  . senna-docusate  2 tablet Oral BID  . vancomycin  750 mg Intravenous Q12H   Continuous Infusions:   Principal Problem:   Cellulitis of right lower extremity Active Problems:   A-fib   Diabetes type 2, controlled  Time spent 20 minutes

## 2014-08-26 NOTE — Care Management Note (Signed)
Case Management Note  Patient Details  Name: Tony [name] MRN: [id number] Date of Birth: Aug 13, 1945  Subjective/Objective:                    Action/Plan:   Expected Discharge Date:                  Expected Discharge Plan:  [place]  In-House Referral:  NA  Discharge planning Services  CM Consult  Post Acute Care Choice:  Home Health Choice offered to:  Patient  DME Arranged:    DME Agency:  [place]:  RN [facility] Agency:     Status of Service:  Completed, signed off  Medicare Important Message Given:    Date Medicare IM Given:    Medicare IM give by:    Date Additional Medicare IM Given:    Additional Medicare Important Message give by:     If discussed at [place] of Stay Meetings, dates discussed:    Additional Comments: Anticipate discharge over the weekend with [facility] RN for wound care (per pts choice). Romualdo Bolk of [facility] is aware and will collect the pts information from the chart. [place] services to start within 48 hours of discharge. Weekend staff to call and fax order at discharge. Pt and pts nurse aware of discharge arrangements. Pts PCP is Dr. Rogue Bussing at [facility]. Christinia Gully [place], RN 08/26/2014, 4:25 PM

## 2014-08-26 NOTE — Progress Notes (Signed)
ANTIBIOTIC CONSULT NOTE - FOLLOW UP  Pharmacy Consult for Vancomycin and Zosyn  Indication: cellulitis   Allergies  Allergen Reactions  . Terazosin Other (See Comments)    Patient is not familiar with this medication as an allergy   Patient Measurements: Height: 6\' 1"  (185.4 cm) Weight: 239 lb 9.6 oz (108.682 kg) IBW/kg (Calculated) : 79.9  Vital Signs: Temp: 98.6 F (37 C) (07/08 0535) Temp Source: Oral (07/08 0535) BP: 114/60 mmHg (07/08 0535) Pulse Rate: 69 (07/08 0535) Intake/Output from previous day: 07/07 0701 - 07/08 0700 In: 1710 [P.O.:960; IV Piggyback:750] Out: -  Intake/Output from this shift: Total I/O In: 600 [P.O.:600] Out: -   Labs:  Recent Labs  08/23/14 1718 08/24/14 0620 08/26/14 0608  WBC 7.0 6.1  --   HGB 13.1 12.0*  --   PLT 188 202  --   CREATININE 1.02 1.07 1.07   Estimated Creatinine Clearance: 84.2 mL/min (by C-G formula based on Cr of 1.07).  Recent Labs  08/26/14 0608  [facility] 11     Microbiology: No results found for this or any previous visit (from the past 720 hour(s)).  Anti-infectives    Start     Dose/Rate Route Frequency Ordered Stop   08/24/14 0600  vancomycin (VANCOCIN) IVPB 750 mg/150 ml premix     750 mg 150 mL/hr over 60 Minutes Intravenous Every 12 hours 08/23/14 2114     08/24/14 0400  piperacillin-tazobactam (ZOSYN) IVPB 3.375 g     3.375 g 12.5 mL/hr over 240 Minutes Intravenous Every 8 hours 08/23/14 2114     08/23/14 2030  piperacillin-tazobactam (ZOSYN) IVPB 3.375 g     3.375 g 100 mL/hr over 30 Minutes Intravenous  Once 08/23/14 2015 08/24/14 0041   08/23/14 1900  vancomycin (VANCOCIN) IVPB 1000 mg/200 mL premix     1,000 mg 200 mL/hr over 60 Minutes Intravenous  Once 08/23/14 1759 08/23/14 2045   08/23/14 1800  vancomycin (VANCOCIN) IVPB 1000 mg/200 mL premix     1,000 mg 200 mL/hr over 60 Minutes Intravenous  Once 08/23/14 1759 08/23/14 1906     Assessment: Okay for Protoco.  Recent treatment  with PO Ciprofloxacin.  Obesity/Normalized CrCl Vancomycin dosing protocol will be initiated with an estimated normalized CrCl = 69.5 ml/min.  Trough at goal.  Goal of Therapy:  Vancomycin trough level 10-15 mcg/ml  Plan:  Continue Zosyn 3.375gm IV every 8 hours. Follow-up micro data, labs, vitals.  Continue Vancomycin 750mg  IV every 12 hours Measure antibiotic drug levels at steady state Follow up culture results  Pricilla Larsson 08/26/2014,1:02 PM

## 2014-08-26 NOTE — Care Management (Signed)
Important Message  Patient Details  Name: Tony [name] MRN: [id number] Date of Birth: 1945-03-31   Medicare Important Message Given:  Yes-second notification given    [name], RN 08/26/2014, 4:27 PM

## 2014-08-27 LAB — GLUCOSE, CAPILLARY
GLUCOSE-CAPILLARY: 194 mg/dL — AB (ref 65–99)
GLUCOSE-CAPILLARY: 208 mg/dL — AB (ref 65–99)

## 2014-08-27 MED ORDER — DOXYCYCLINE HYCLATE 100 MG PO TABS: 100.0000 mg | ORAL_TABLET | Freq: Two times a day (BID) | ORAL | Status: DC

## 2014-08-27 MED ORDER — AMOXICILLIN-POT CLAVULANATE 875-125 MG PO TABS: 1.0000 | ORAL_TABLET | Freq: Two times a day (BID) | ORAL | Status: DC

## 2014-08-27 NOTE — Progress Notes (Signed)
Tony [name] discharged home with wife per MD order.  Discharge instructions reviewed and discussed with the patient and wife at bedside, all questions and concerns answered. Copy of instructions and scripts given to patient.    Medication List    STOP taking these medications        sulfamethoxazole-trimethoprim 800-160 MG per tablet  Commonly known as:  SEPTRA DS      TAKE these medications        acetaminophen 500 MG tablet  Commonly known as:  TYLENOL  Take 1,000 mg by mouth every 6 (six) hours as needed for mild pain.     albuterol 108 (90 BASE) MCG/ACT inhaler  Commonly known as:  PROVENTIL HFA;VENTOLIN HFA  Inhale 2 puffs into the lungs every 6 (six) hours as needed. Shortness of breath     ammonium lactate 12 % lotion  Commonly known as:  LAC-HYDRIN  Apply 1 application topically as needed for dry skin.     amoxicillin-clavulanate 875-125 MG per tablet  Commonly known as:  AUGMENTIN  Take 1 tablet by mouth every 12 (twelve) hours.     atorvastatin 80 MG tablet  Commonly known as:  LIPITOR  Take 40 mg by mouth daily.     capsaicin 0.025 % cream  Commonly known as:  ZOSTRIX  Apply 1 application topically 2 (two) times daily.     dabigatran 150 MG Caps capsule  Commonly known as:  PRADAXA  Take 150 mg by mouth every 12 (twelve) hours.     diphenhydramine-acetaminophen 25-500 MG Tabs  Commonly known as:  TYLENOL PM  Take 2 tablets by mouth at bedtime.     docusate sodium 100 MG capsule  Commonly known as:  COLACE  Take 100 mg by mouth 2 (two) times daily.     doxycycline 100 MG tablet  Commonly known as:  VIBRA-TABS  Take 1 tablet (100 mg total) by mouth every 12 (twelve) hours.     finasteride 5 MG tablet  Commonly known as:  PROSCAR  Take 5 mg by mouth at bedtime.     fluticasone 50 MCG/ACT nasal spray  Commonly known as:  FLONASE  Place 1 spray into both nostrils daily as needed for allergies or rhinitis.     levothyroxine 100 MCG tablet   Commonly known as:  SYNTHROID, LEVOTHROID  Take 200 mcg by mouth every morning.     metFORMIN 500 MG tablet  Commonly known as:  GLUCOPHAGE  Take 1,000 mg by mouth 2 (two) times daily.     mometasone 220 MCG/INH inhaler  Commonly known as:  ASMANEX  Inhale 2 puffs into the lungs 2 (two) times daily as needed. May use at home medication     omeprazole 20 MG capsule  Commonly known as:  PRILOSEC  Take 20 mg by mouth at bedtime.     oxyCODONE 5 MG immediate release tablet  Commonly known as:  Oxy IR/ROXICODONE  Take 5 mg by mouth 3 (three) times daily as needed for moderate pain.     pregabalin 100 MG capsule  Commonly known as:  LYRICA  Take 200 mg by mouth 3 (three) times daily.     sennosides-docusate sodium 8.6-50 MG tablet  Commonly known as:  SENOKOT-S  Take 2 tablets by mouth 2 (two) times daily.     traZODone 50 MG tablet  Commonly known as:  DESYREL  Take 50 mg by mouth at bedtime as needed for sleep.  IV site discontinued and catheter remains intact. Site without signs and symptoms of complications. Dressing and pressure applied.  Patient escorted to car by Lovena Le, RN in a wheelchair,  no distress noted upon discharge.  Regino Bellow 08/27/2014 3:16 PM

## 2014-08-27 NOTE — Discharge Summary (Signed)
Physician Discharge Summary  Tony [name]:[id number] DOB: 07-Aug-1945 DOA: 08/23/2014  PCP: [place] date: 08/23/2014 Discharge date: 08/27/2014  Recommendations for Outpatient Follow-up:  1. Cellulitis right fourth toe, foot, lower leg resolution. 2. Continue wound care, patient will be referred to podiatry for long-term management. 3. Wound care recommendations: Silver hydrofiber to absorb exudate, treat bioburdan, change every other day.  4. Home with [facility] RN for wound care   Follow-up Information    Follow up with [facility].   Contact information:   [address] [place] [address] [phone]       Schedule an appointment as soon as possible for a visit with [facility].   Specialty:  General Practice   Contact information:   [place] [address] [phone]       Follow up with Marcheta Grammes, DPM.   Specialty:  Podiatry   Why:  office will call you with appointment   Contact information:   [place] [address] [phone]      Discharge Diagnoses:  1. Cellulitis right fourth toe, foot, lower leg 2. Diabetes mellitus with peripheral neuropathy 3. Atrial fibrillation  Discharge Condition: Improved Disposition: Home  Diet recommendation: Diabetic diet  Filed Weights   08/23/14 1656 08/23/14 2119  Weight: 105.235 kg (232 lb) 108.682 kg (239 lb 9.6 oz)    History of present illness:  69 year old man with history of diabetes mellitus with neuropathy with history of 2 weeks of redness and drainage from the right foot, treated with ciprofloxacin with some improvement. However after finishing a course of Cipro, he developed increasing foot and leg redness and was admitted for further treatment.  Hospital Course:  Mr. Chittum was treated with empiric antibiotics and wound care as per recommendations with rapid improvement in cellulitis. No radiographic  evidence of osteomyelitis. Given his clinical improvement and he was transitioned to oral antibiotics, with plan to continue as an outpatient, home health for wound care and referral to podiatry for ongoing care. Diabetes remained stable during this hospitalization.  Consultants:  Wound care RN  Procedures:    Antibiotics:  Vancomycin 7/6 >> 7/9  Zosyn 7/6 >> 7/9  Augmentin 7/9 >> 7/19  Doxycycline 7/9 >> 7/19  Discharge Instructions  Discharge Instructions    Activity as tolerated - No restrictions    Complete by:  As directed      Diet Carb Modified    Complete by:  As directed      Discharge instructions    Complete by:  As directed   Call your physician or seek immediate medical attention for fever, redness, swelling, drainage from wound, pain or worsening of condition. Home health nurse will come to your house to assist with wound care. Wound care recommendations: Silver hydrofiber to absorb drainage, change every other day.Keep clean and dry. Do not soak. Recommend follow up with podiatry for long term management of foot ulcerations, will make referral 7/11 and office will call you. Be sure to see your medical doctor regularly for diabetes care.          Current Discharge Medication List    START taking these medications   Details  amoxicillin-clavulanate (AUGMENTIN) 875-125 MG per tablet Take 1 tablet by mouth every 12 (twelve) hours. Qty: 20 tablet, Refills: 0    doxycycline (VIBRA-TABS) 100 MG tablet Take 1 tablet (100 mg total) by mouth every 12 (twelve) hours.  Qty: 20 tablet, Refills: 0      CONTINUE these medications which have NOT CHANGED   Details  acetaminophen (TYLENOL) 500 MG tablet Take 1,000 mg by mouth every 6 (six) hours as needed for mild pain.    albuterol (PROVENTIL HFA;VENTOLIN HFA) 108 (90 BASE) MCG/ACT inhaler Inhale 2 puffs into the lungs every 6 (six) hours as needed. Shortness of breath     ammonium lactate (LAC-HYDRIN) 12 % lotion  Apply 1 application topically as needed for dry skin.    atorvastatin (LIPITOR) 80 MG tablet Take 40 mg by mouth daily.    capsaicin (ZOSTRIX) 0.025 % cream Apply 1 application topically 2 (two) times daily.    dabigatran (PRADAXA) 150 MG CAPS capsule Take 150 mg by mouth every 12 (twelve) hours.    diphenhydramine-acetaminophen (TYLENOL PM) 25-500 MG TABS Take 2 tablets by mouth at bedtime.    docusate sodium (COLACE) 100 MG capsule Take 100 mg by mouth 2 (two) times daily.    finasteride (PROSCAR) 5 MG tablet Take 5 mg by mouth at bedtime.     fluticasone (FLONASE) 50 MCG/ACT nasal spray Place 1 spray into both nostrils daily as needed for allergies or rhinitis.    levothyroxine (SYNTHROID, LEVOTHROID) 100 MCG tablet Take 200 mcg by mouth every morning.     metFORMIN (GLUCOPHAGE) 500 MG tablet Take 1,000 mg by mouth 2 (two) times daily.     mometasone (ASMANEX) 220 MCG/INH inhaler Inhale 2 puffs into the lungs 2 (two) times daily as needed. May use at home medication    omeprazole (PRILOSEC) 20 MG capsule Take 20 mg by mouth at bedtime.     oxyCODONE (OXY IR/ROXICODONE) 5 MG immediate release tablet Take 5 mg by mouth 3 (three) times daily as needed for moderate pain.     pregabalin (LYRICA) 100 MG capsule Take 200 mg by mouth 3 (three) times daily.    sennosides-docusate sodium (SENOKOT-S) 8.6-50 MG tablet Take 2 tablets by mouth 2 (two) times daily.    traZODone (DESYREL) 50 MG tablet Take 50 mg by mouth at bedtime as needed for sleep.       STOP taking these medications     sulfamethoxazole-trimethoprim (SEPTRA DS) 800-160 MG per tablet        Allergies  Allergen Reactions  . Terazosin Other (See Comments)    Patient is not familiar with this medication as an allergy    The results of significant diagnostics from this hospitalization (including imaging, microbiology, ancillary and laboratory) are listed below for reference.    Significant Diagnostic Studies: Dg  Foot 2 Views Right  2014/09/12   CLINICAL DATA:  69 year old male with right fluid swelling and cellulitis.  EXAM: RIGHT FOOT - 2 VIEW  COMPARISON:  Radiograph dated 04/02/2012  FINDINGS: There is stable erosive changes of the first interphalangeal joint with stable lateral subluxation of the distal phalanx of the great toe. Stable erosive changes of the medial head of the first metatarsal. There is stable resection of the distal aspect of the distal phalanx of the third toe. There is no acute fracture. There is diffuse soft tissue swelling of the foot and toes. No radiopaque foreign object identified.  IMPRESSION: Stable erosive changes.  No acute fracture.  Diffuse soft tissue swelling.  No radiopaque foreign object.   Electronically Signed   By: Anner Crete M.D.   On: 12-Sep-2014 21:05    Labs: Basic Metabolic Panel:  Recent Labs Lab 2014-09-12 1718 08/24/14 9678 08/26/14 9381  NA 137 138 138  K 4.2 4.0 4.0  CL 102 100* 101  CO2 26 29 29   GLUCOSE 166* 160* 172*  BUN 15 15 15   CREATININE 1.02 1.07 1.07  CALCIUM 8.9 8.7* 8.9   Liver Function Tests:  Recent Labs Lab 08/23/14 1718 08/24/14 0620  AST 19 19  ALT 19 18  ALKPHOS 61 54  BILITOT 0.8 1.1  PROT 7.3 6.7  ALBUMIN 3.9 3.5   CBC:  Recent Labs Lab 08/23/14 1718 08/24/14 0620  WBC 7.0 6.1  NEUTROABS 5.1  --   HGB 13.1 12.0*  HCT 39.7 37.7*  MCV 91.5 92.9  PLT 188 202   CBG:  Recent Labs Lab 08/26/14 1117 08/26/14 1631 08/26/14 2112 08/27/14 0814 08/27/14 1159  GLUCAP 239* 154* 231* 194* 208*    Principal Problem:   Cellulitis of right lower extremity Active Problems:   A-fib   Diabetes type 2, controlled   Time coordinating discharge: 35 minutes  Signed:  Murray Hodgkins, MD [facility] 08/27/2014, 1:12 PM

## 2014-08-27 NOTE — Progress Notes (Signed)
PROGRESS NOTE  Tony [name]:[id number] DOB: [birth year] DOA: 08/23/2014 PCP: [facility] Dr. Rogue Bussing   Summary: 69 year old man with history of diabetes mellitus with neuropathy with history of 2 weeks of redness and drainage from the right foot, treated with ciprofloxacin with some improvement. However after finishing a course of Cipro, he developed increasing foot and leg redness and was admitted for further treatment.  Assessment/Plan: 1. Cellulitis right fourth toe, foot, lower leg. Continues to improve. Appreciate wound care recommendations. No plain film evidence of osteomyelitis. 2. Diabetes mellitus with peripheral neuropathy. Blood sugars remain stable. 3. Atrial fibrillation on dabigatran 4. Pacemaker placement   Overall continues to improve. Plan to transition to oral antibiotics today, discharge home.  Wound care recommendations: Silver hydrofiber to absorb exudate, treat bioburdan, change every other day.   Recommend follow up with podiatry for long term management of foot ulcerations, will make referral 7/11.   Home with [facility] RN for wound care  Murray Hodgkins, MD  [facility]  Pager [phone] If 7PM-7AM, please contact night-coverage at [email or web address], password [email or web address] 08/27/2014, 11:30 AM  LOS: 4 days   Consultants:    Procedures:    Antibiotics:  Vancomycin 7/6 >> 7/9  Zosyn 7/6 >> 7/9  Augmentin 7/9 >> 7/19  Doxycycline 7/9 >> 7/19  HPI/Subjective: Feeling much better. No complaints. No pain.  Objective: Filed Vitals:   08/26/14 0535 08/26/14 1537 08/26/14 2110 08/27/14 0520  BP: 114/60 98/65 96/59  98/58  Pulse: 69 61 60 63  Temp: 98.6 F (37 C) 97.7 F (36.5 C) 97.8 F (36.6 C) 98 F (36.7 C)  TempSrc: Oral Oral Oral Oral  Resp: 18 18 18 18   Height:      Weight:      SpO2: 98% 100% 100% 99%    Intake/Output Summary (Last 24 hours) at 08/27/14 1130 Last data filed at 08/27/14 0800  Gross per 24 hour  Intake    1080 ml  Output   1600 ml  Net   -520 ml     Filed Weights   08/23/14 1656 08/23/14 2119  Weight: 105.235 kg (232 lb) 108.682 kg (239 lb 9.6 oz)    Exam:    Afebrile, vital signs stable. No hypoxia. General:  Appears calm and comfortable Cardiovascular: RRR, no m/r/g. No left LE edema. Respiratory: CTA bilaterally, no w/r/r. Normal respiratory effort. Skin: Erythema right lower extremity resolved. There are still residual edema but it is improving. The fourth toe has granulation tissue and appears to be slowly improving. There is no significant drainage. The foot is warm. Perfusion appears grossly normal. Psychiatric: grossly normal mood and affect, speech fluent and appropriate  New data reviewed:  CBG stable  Pertinent data since admission:  X-rays right foot: No acute bony abnormalities.  Pending data:    Scheduled Meds: . atorvastatin  40 mg Oral q1800  . capsaicin  1 application Topical BID  . dabigatran  150 mg Oral Q12H  . docusate sodium  100 mg Oral BID  . finasteride  5 mg Oral QHS  . insulin aspart  0-20 Units Subcutaneous TID WC  . insulin aspart  0-5 Units Subcutaneous QHS  . levothyroxine  200 mcg Oral q morning - 10a  . pantoprazole  40 mg Oral Daily  . piperacillin-tazobactam (ZOSYN)  IV  3.375 g Intravenous Q8H  . pregabalin  200 mg Oral TID  . senna-docusate  2 tablet Oral BID  . vancomycin  750 mg Intravenous Q12H  Continuous Infusions:   Principal Problem:   Cellulitis of right lower extremity Active Problems:   A-fib   Diabetes type 2, controlled

## 2014-09-06 ENCOUNTER — Encounter (HOSPITAL_COMMUNITY): Payer: Self-pay | Admitting: Emergency Medicine

## 2014-09-06 ENCOUNTER — Emergency Department (HOSPITAL_COMMUNITY)
Admission: EM | Admit: 2014-09-06 | Discharge: 2014-09-06 | Disposition: A | Discharge status: Home / Self Care | Payer: Medicare PPO | Attending: Emergency Medicine | Admitting: Emergency Medicine

## 2014-09-06 DIAGNOSIS — E119 Type 2 diabetes mellitus without complications: Secondary | ICD-10-CM | POA: Diagnosis not present

## 2014-09-06 DIAGNOSIS — Z79899 Other long term (current) drug therapy: Secondary | ICD-10-CM | POA: Diagnosis not present

## 2014-09-06 DIAGNOSIS — Z950 Presence of cardiac pacemaker: Secondary | ICD-10-CM | POA: Insufficient documentation

## 2014-09-06 DIAGNOSIS — Z7951 Long term (current) use of inhaled steroids: Secondary | ICD-10-CM | POA: Diagnosis not present

## 2014-09-06 DIAGNOSIS — G629 Polyneuropathy, unspecified: Secondary | ICD-10-CM | POA: Insufficient documentation

## 2014-09-06 DIAGNOSIS — L03031 Cellulitis of right toe: Secondary | ICD-10-CM | POA: Insufficient documentation

## 2014-09-06 DIAGNOSIS — F4310 Post-traumatic stress disorder, unspecified: Secondary | ICD-10-CM | POA: Diagnosis not present

## 2014-09-06 DIAGNOSIS — J449 Chronic obstructive pulmonary disease, unspecified: Secondary | ICD-10-CM | POA: Insufficient documentation

## 2014-09-06 DIAGNOSIS — Z4800 Encounter for change or removal of nonsurgical wound dressing: Secondary | ICD-10-CM | POA: Diagnosis present

## 2014-09-06 DIAGNOSIS — I4891 Unspecified atrial fibrillation: Secondary | ICD-10-CM | POA: Diagnosis not present

## 2014-09-06 LAB — CBG MONITORING, ED: Glucose-Capillary: 149 mg/dL — ABNORMAL HIGH (ref 65–99)

## 2014-09-06 MED ORDER — SULFAMETHOXAZOLE-TRIMETHOPRIM 800-160 MG PO TABS: 1.0000 | ORAL_TABLET | Freq: Two times a day (BID) | ORAL | Status: AC

## 2014-09-06 NOTE — Discharge Instructions (Signed)
Follow up with dr. Aline Brochure in one week

## 2014-09-06 NOTE — ED Provider Notes (Signed)
CSN: [id number]     Arrival date & time 09/06/14  1324 History   First MD Initiated Contact with Patient 09/06/14 1334     Chief Complaint  Patient presents with  . Wound Check     (Consider location/radiation/quality/duration/timing/severity/associated sxs/prior Treatment) Patient is a 69 y.o. male presenting with wound check. The history is provided by the patient (the pt is here to have right 4th toe checked.  he is on antibiotics for infection).  Wound Check This is a new problem. The current episode started more than 1 week ago. The problem occurs constantly. The problem has been gradually improving. Pertinent negatives include no chest pain, no abdominal pain and no headaches. Nothing aggravates the symptoms. Nothing relieves the symptoms.    Past Medical History  Diagnosis Date  . Neuropathy   . COPD (chronic obstructive pulmonary disease)   . Diabetes mellitus   . Spinal stenosis   . Atrial fibrillation   . PTSD (post-traumatic stress disorder)   . Pacemaker    Past Surgical History  Procedure Laterality Date  . Cholecystectomy     Family History  Problem Relation Age of Onset  . Heart disease Mother   . Prostate cancer Brother   . Kidney cancer Brother    History  Substance Use Topics  . Smoking status: Never Smoker   . Smokeless tobacco: Never Used  . Alcohol Use: No    Review of Systems  Constitutional: Negative for appetite change and fatigue.  HENT: Negative for congestion, ear discharge and sinus pressure.   Eyes: Negative for discharge.  Respiratory: Negative for cough.   Cardiovascular: Negative for chest pain.  Gastrointestinal: Negative for abdominal pain and diarrhea.  Genitourinary: Negative for frequency and hematuria.  Musculoskeletal: Negative for back pain.       Infected right 4th toe  Skin: Negative for rash.  Neurological: Negative for seizures and headaches.  Psychiatric/Behavioral: Negative for hallucinations.      Allergies   Terazosin  Home Medications   Prior to Admission medications   Medication Sig Start Date End Date Taking? Authorizing Provider  acetaminophen (TYLENOL) 500 MG tablet Take 1,000 mg by mouth every 6 (six) hours as needed for mild pain.    Historical Provider, MD  albuterol (PROVENTIL HFA;VENTOLIN HFA) 108 (90 BASE) MCG/ACT inhaler Inhale 2 puffs into the lungs every 6 (six) hours as needed. Shortness of breath     Historical Provider, MD  ammonium lactate (LAC-HYDRIN) 12 % lotion Apply 1 application topically as needed for dry skin.    Historical Provider, MD  amoxicillin-clavulanate (AUGMENTIN) 875-125 MG per tablet Take 1 tablet by mouth every 12 (twelve) hours. 08/27/14   Samuella Cota, MD  atorvastatin (LIPITOR) 80 MG tablet Take 40 mg by mouth daily.    Historical Provider, MD  capsaicin (ZOSTRIX) 0.025 % cream Apply 1 application topically 2 (two) times daily.    Historical Provider, MD  dabigatran (PRADAXA) 150 MG CAPS capsule Take 150 mg by mouth every 12 (twelve) hours.    Historical Provider, MD  diphenhydramine-acetaminophen (TYLENOL PM) 25-500 MG TABS Take 2 tablets by mouth at bedtime.    Historical Provider, MD  docusate sodium (COLACE) 100 MG capsule Take 100 mg by mouth 2 (two) times daily.    Historical Provider, MD  doxycycline (VIBRA-TABS) 100 MG tablet Take 1 tablet (100 mg total) by mouth every 12 (twelve) hours. 08/27/14   Samuella Cota, MD  finasteride (PROSCAR) 5 MG tablet Take 5 mg by  mouth at bedtime.     Historical Provider, MD  fluticasone (FLONASE) 50 MCG/ACT nasal spray Place 1 spray into both nostrils daily as needed for allergies or rhinitis.    Historical Provider, MD  levothyroxine (SYNTHROID, LEVOTHROID) 100 MCG tablet Take 200 mcg by mouth every morning.     Historical Provider, MD  metFORMIN (GLUCOPHAGE) 500 MG tablet Take 1,000 mg by mouth 2 (two) times daily.     Historical Provider, MD  mometasone [facility]) 220 MCG/INH inhaler Inhale 2 puffs into the  lungs 2 (two) times daily as needed. May use at home medication    Historical Provider, MD  omeprazole (PRILOSEC) 20 MG capsule Take 20 mg by mouth at bedtime.     Historical Provider, MD  oxyCODONE (OXY IR/ROXICODONE) 5 MG immediate release tablet Take 5 mg by mouth 3 (three) times daily as needed for moderate pain.     Historical Provider, MD  pregabalin (LYRICA) 100 MG capsule Take 200 mg by mouth 3 (three) times daily.    Historical Provider, MD  sennosides-docusate sodium (SENOKOT-S) 8.6-50 MG tablet Take 2 tablets by mouth 2 (two) times daily.    Historical Provider, MD  sulfamethoxazole-trimethoprim (BACTRIM DS,SEPTRA DS) 800-160 MG per tablet Take 1 tablet by mouth 2 (two) times daily. 09/06/14 09/13/14  Milton Ferguson, MD  traZODone (DESYREL) 50 MG tablet Take 50 mg by mouth at bedtime as needed for sleep.     Historical Provider, MD   BP 120/58 mmHg  Pulse 71  Temp(Src) 98.5 F (36.9 C) (Oral)  Resp 18  Ht 6\' 1"  (1.854 m)  Wt 235 lb (106.595 kg)  BMI 31.01 kg/m2  SpO2 100% Physical Exam  Constitutional: He is oriented to person, place, and time. He appears well-developed.  HENT:  Head: Normocephalic.  Eyes: Conjunctivae are normal.  Neck: No tracheal deviation present.  Cardiovascular:  No murmur heard. Musculoskeletal: Normal range of motion.  End of right 4th toe shows granulation tissue and healing infection  Neurological: He is oriented to person, place, and time.  Skin: Skin is warm.  Psychiatric: He has a normal mood and affect.    ED Course  Procedures (including critical care time) Labs Review Labs Reviewed  CBG MONITORING, ED - Abnormal; Notable for the following:    Glucose-Capillary 149 (*)    All other components within normal limits    Imaging Review No results found.   EKG Interpretation None      MDM   Final diagnoses:  None    Healing infected toe.  Will continue antibiotics with sulfa and follow up with ortho    Milton Ferguson,  MD 09/06/14 1442

## 2014-09-06 NOTE — ED Notes (Signed)
History of wound to right 4th toe.  Wound care nurse notice changes this am.  Pt says you can almost see the bone.  Denies pain.

## 2014-09-08 ENCOUNTER — Telehealth: Payer: Self-pay | Admitting: Orthopedic Surgery

## 2014-09-08 NOTE — Telephone Encounter (Signed)
Mandy from [place] is aware

## 2014-09-08 NOTE — Telephone Encounter (Signed)
I VE NEVER SEEN HIM   HE HAS A DIABETIC FOOT ULCER WHICH WE DO NOT SEE HERE   IF HES HAVING PROBLEMS GO TO ER

## 2014-09-08 NOTE — Telephone Encounter (Signed)
Tony [name] was in the ER on 09/06/14 was in the hospital at [facility] prior to this he has cellulitis of 4th digit Rt Foot, [facility] states this man has a pretty bad infection, He needs follow up ASAP, I advised Leafy Ro From [facility] that you would be out of the office next week and she needed to call Dr. Briscoe Burns office to have this gentleman followed up. Leafy Ro called back and stated that Dr. Briscoe Burns office stated that Dr. Luna Glasgow is not on call and that this gentleman should call back to our office and follow up Thursday August 18th in which [facility] thinks this is to long for this man to wait/please advise?

## 2014-10-06 ENCOUNTER — Ambulatory Visit: Payer: Medicare Other | Admitting: Orthopedic Surgery

## 2014-11-17 DIAGNOSIS — E11621 Type 2 diabetes mellitus with foot ulcer: Secondary | ICD-10-CM

## 2015-06-02 ENCOUNTER — Emergency Department (HOSPITAL_COMMUNITY)
Admission: EM | Admit: 2015-06-02 | Discharge: 2015-06-02 | Disposition: A | Discharge status: Home / Self Care | Payer: Medicare PPO | Attending: Emergency Medicine | Admitting: Emergency Medicine

## 2015-06-02 ENCOUNTER — Emergency Department (HOSPITAL_COMMUNITY): Payer: Medicare PPO

## 2015-06-02 ENCOUNTER — Encounter (HOSPITAL_COMMUNITY): Payer: Self-pay | Admitting: Emergency Medicine

## 2015-06-02 DIAGNOSIS — J449 Chronic obstructive pulmonary disease, unspecified: Secondary | ICD-10-CM | POA: Diagnosis not present

## 2015-06-02 DIAGNOSIS — B349 Viral infection, unspecified: Secondary | ICD-10-CM | POA: Insufficient documentation

## 2015-06-02 DIAGNOSIS — E1140 Type 2 diabetes mellitus with diabetic neuropathy, unspecified: Secondary | ICD-10-CM | POA: Insufficient documentation

## 2015-06-02 DIAGNOSIS — E860 Dehydration: Secondary | ICD-10-CM

## 2015-06-02 DIAGNOSIS — I4891 Unspecified atrial fibrillation: Secondary | ICD-10-CM | POA: Insufficient documentation

## 2015-06-02 DIAGNOSIS — Z7982 Long term (current) use of aspirin: Secondary | ICD-10-CM | POA: Diagnosis not present

## 2015-06-02 DIAGNOSIS — R509 Fever, unspecified: Secondary | ICD-10-CM | POA: Diagnosis present

## 2015-06-02 DIAGNOSIS — Z79899 Other long term (current) drug therapy: Secondary | ICD-10-CM | POA: Diagnosis not present

## 2015-06-02 DIAGNOSIS — Z950 Presence of cardiac pacemaker: Secondary | ICD-10-CM | POA: Diagnosis not present

## 2015-06-02 DIAGNOSIS — R531 Weakness: Secondary | ICD-10-CM

## 2015-06-02 LAB — URINALYSIS, ROUTINE W REFLEX MICROSCOPIC
BILIRUBIN URINE: NEGATIVE
Glucose, UA: 250 mg/dL — AB
Ketones, ur: NEGATIVE mg/dL
Leukocytes, UA: NEGATIVE
Nitrite: NEGATIVE
PH: 5.5 (ref 5.0–8.0)
Protein, ur: NEGATIVE mg/dL
Specific Gravity, Urine: 1.02 (ref 1.005–1.030)

## 2015-06-02 LAB — COMPREHENSIVE METABOLIC PANEL
ALK PHOS: 48 U/L (ref 38–126)
ALT: 19 U/L (ref 17–63)
AST: 20 U/L (ref 15–41)
Albumin: 4 g/dL (ref 3.5–5.0)
Anion gap: 8 (ref 5–15)
BUN: 18 mg/dL (ref 6–20)
CHLORIDE: 101 mmol/L (ref 101–111)
CO2: 25 mmol/L (ref 22–32)
CREATININE: 1.1 mg/dL (ref 0.61–1.24)
Calcium: 8.6 mg/dL — ABNORMAL LOW (ref 8.9–10.3)
GFR calc Af Amer: >60 mL/min (ref >60)
GFR calc non Af Amer: >60 mL/min (ref >60)
Glucose, Bld: 278 mg/dL — ABNORMAL HIGH (ref 65–99)
Potassium: 4.5 mmol/L (ref 3.5–5.1)
SODIUM: 134 mmol/L — AB (ref 135–145)
Total Bilirubin: 1.1 mg/dL (ref 0.3–1.2)
Total Protein: 6.7 g/dL (ref 6.5–8.1)

## 2015-06-02 LAB — CBG MONITORING, ED: GLUCOSE-CAPILLARY: 250 mg/dL — AB (ref 65–99)

## 2015-06-02 LAB — CBC WITH DIFFERENTIAL/PLATELET
BASOS ABS: 0 10*3/uL (ref 0.0–0.1)
BASOS PCT: 0 %
Eosinophils Absolute: 0 10*3/uL (ref 0.0–0.7)
Eosinophils Relative: 0 %
HCT: 35.8 % — ABNORMAL LOW (ref 39.0–52.0)
HEMOGLOBIN: 12.2 g/dL — AB (ref 13.0–17.0)
LYMPHS PCT: 6 %
Lymphs Abs: 0.4 10*3/uL — ABNORMAL LOW (ref 0.7–4.0)
MCH: 30 pg (ref 26.0–34.0)
MCHC: 34.1 g/dL (ref 30.0–36.0)
MCV: 88.2 fL (ref 78.0–100.0)
Monocytes Absolute: 0.4 10*3/uL (ref 0.1–1.0)
Monocytes Relative: 7 %
NEUTROS PCT: 87 %
Neutro Abs: 5.4 10*3/uL (ref 1.7–7.7)
Platelets: 125 10*3/uL — ABNORMAL LOW (ref 150–400)
RBC: 4.06 MIL/uL — ABNORMAL LOW (ref 4.22–5.81)
RDW: 14.4 % (ref 11.5–15.5)
WBC: 6.2 10*3/uL (ref 4.0–10.5)

## 2015-06-02 LAB — LACTIC ACID, PLASMA
LACTIC ACID, VENOUS: 1.5 mmol/L (ref 0.5–2.0)
Lactic Acid, Venous: 2 mmol/L (ref 0.5–2.0)

## 2015-06-02 LAB — INFLUENZA PANEL BY PCR (TYPE A & B)
H1N1 flu by pcr: NOT DETECTED
INFLAPCR: NEGATIVE
Influenza B By PCR: NEGATIVE

## 2015-06-02 LAB — URINE MICROSCOPIC-ADD ON

## 2015-06-02 MED ORDER — ACETAMINOPHEN 325 MG PO TABS
ORAL_TABLET | ORAL | Status: DC
Start: 2015-06-02 — End: 2015-06-03
  Filled 2015-06-02: qty 2

## 2015-06-02 MED ORDER — SODIUM CHLORIDE 0.9 % IV BOLUS (SEPSIS)
1000.0000 mL | Freq: Once | INTRAVENOUS | Status: AC
  Administered 2015-06-02: 1000 mL via INTRAVENOUS

## 2015-06-02 MED ORDER — ACETAMINOPHEN 325 MG PO TABS
650.0000 mg | ORAL_TABLET | Freq: Once | ORAL | Status: AC | PRN
  Administered 2015-06-02: 650 mg via ORAL

## 2015-06-02 NOTE — ED Notes (Signed)
Pt given a drink at this time  

## 2015-06-02 NOTE — ED Notes (Signed)
MD at bedside. 

## 2015-06-02 NOTE — ED Notes (Signed)
Pt reports cough, fatigue and generalized weakness over the past few days. Pt also reports a diabetic ulcer on LLE that has begun to drain. Pt has fever of 102.6 in triage.

## 2015-06-02 NOTE — ED Provider Notes (Signed)
CSN: [id number]     Arrival date & time 06/02/15  1745 History   First MD Initiated Contact with Patient 06/02/15 1817     Chief Complaint  Patient presents with  . Fatigue  . Weakness      HPI Patient presents to the emergency department with generalized weakness over the past 2 days and generalized fatigue.  He has had cough with some occasional shortness of breath.  He presents to the emergency department with a temperature of 102.6.  There is a small nonhealing wound of his left lower extremity with some mild serosanguineous drainage that is not purulent.  No surrounding erythema or warmth is noted by the patient of his left leg.  He is been managing this wound for several weeks.  He denies abdominal pain.  Does report one episode of diarrhea.  Reports no nausea or vomiting.  No recent sick contacts.  Denies dysuria or urinary frequency   Past Medical History  Diagnosis Date  . Neuropathy ([place])   . COPD (chronic obstructive pulmonary disease) ([place])   . Diabetes mellitus   . Spinal stenosis   . Atrial fibrillation ([place])   . PTSD (post-traumatic stress disorder)   . Pacemaker    Past Surgical History  Procedure Laterality Date  . Cholecystectomy     Family History  Problem Relation Age of Onset  . Heart disease Mother   . Prostate cancer Brother   . Kidney cancer Brother    Social History  Substance Use Topics  . Smoking status: Never Smoker   . Smokeless tobacco: Never Used  . Alcohol Use: No    Review of Systems  All other systems reviewed and are negative.     Allergies  Terazosin  Home Medications   Prior to Admission medications   Medication Sig Start Date End Date Taking? Authorizing Provider  acetaminophen (TYLENOL) 500 MG tablet Take 1,000 mg by mouth every 6 (six) hours as needed for mild pain.   Yes Historical Provider, MD  albuterol (PROVENTIL HFA;VENTOLIN HFA) 108 (90 BASE) MCG/ACT inhaler Inhale 2 puffs into the lungs every 6 (six) hours as  needed. Shortness of breath    Yes Historical Provider, MD  aspirin EC 81 MG tablet Take 81 mg by mouth daily.   Yes Historical Provider, MD  atorvastatin (LIPITOR) 80 MG tablet Take 40 mg by mouth daily.   Yes Historical Provider, MD  dabigatran (PRADAXA) 150 MG CAPS capsule Take 150 mg by mouth every 12 (twelve) hours.   Yes Historical Provider, MD  diphenhydramine-acetaminophen (TYLENOL PM) 25-500 MG TABS Take 2 tablets by mouth at bedtime.   Yes Historical Provider, MD  docusate sodium (COLACE) 100 MG capsule Take 100 mg by mouth 2 (two) times daily.   Yes Historical Provider, MD  finasteride (PROSCAR) 5 MG tablet Take 5 mg by mouth at bedtime.    Yes Historical Provider, MD  fluticasone (FLONASE) 50 MCG/ACT nasal spray Place 1 spray into both nostrils daily as needed for allergies or rhinitis.   Yes Historical Provider, MD  levothyroxine (SYNTHROID, LEVOTHROID) 100 MCG tablet Take 200 mcg by mouth every morning.    Yes Historical Provider, MD  metFORMIN (GLUCOPHAGE) 500 MG tablet Take 1,000 mg by mouth 2 (two) times daily.    Yes Historical Provider, MD  mometasone [facility]) 220 MCG/INH inhaler Inhale 2 puffs into the lungs 2 (two) times daily as needed. May use at home medication   Yes Historical Provider, MD  omeprazole ([place]) 20  MG capsule Take 20 mg by mouth at bedtime.    Yes Historical Provider, MD  oxyCODONE (OXY IR/ROXICODONE) 5 MG immediate release tablet Take 5 mg by mouth 3 (three) times daily as needed for moderate pain.    Yes Historical Provider, MD  pregabalin (LYRICA) 100 MG capsule Take 200 mg by mouth 3 (three) times daily.   Yes Historical Provider, MD  traZODone (DESYREL) 50 MG tablet Take 50 mg by mouth at bedtime as needed for sleep.    Yes Historical Provider, MD   BP 118/66 mmHg  Pulse 61  Temp(Src) 99.5 F (37.5 C) (Oral)  Resp 16  Wt 232 lb (105.235 kg)  SpO2 97% Physical Exam  Constitutional: He is oriented to person, place, and time. He appears  well-developed and well-nourished.  HENT:  Head: Normocephalic and atraumatic.  Eyes: EOM are normal.  Neck: Normal range of motion.  Cardiovascular: Normal rate, regular rhythm, normal heart sounds and intact distal pulses.   Pulmonary/Chest: Effort normal and breath sounds normal. No respiratory distress.  Abdominal: Soft. He exhibits no distension. There is no tenderness.  Musculoskeletal: Normal range of motion.  Nonhealing wound of his distal left anterior tibia without drainage.  No fluctuance.  No surrounding warmth or erythema.  No spreading erythema.  Neurological: He is alert and oriented to person, place, and time.  Skin: Skin is warm and dry.  Psychiatric: He has a normal mood and affect. Judgment normal.  Nursing note and vitals reviewed.   ED Course  Procedures (including critical care time) Labs Review Labs Reviewed  CBC WITH DIFFERENTIAL/PLATELET - Abnormal; Notable for the following:    RBC 4.06 (*)    Hemoglobin 12.2 (*)    HCT 35.8 (*)    Platelets 125 (*)    Lymphs Abs 0.4 (*)    All other components within normal limits  COMPREHENSIVE METABOLIC PANEL - Abnormal; Notable for the following:    Sodium 134 (*)    Glucose, Bld 278 (*)    Calcium 8.6 (*)    All other components within normal limits  URINALYSIS, ROUTINE W REFLEX MICROSCOPIC (NOT AT [facility]) - Abnormal; Notable for the following:    Glucose, UA 250 (*)    Hgb urine dipstick MODERATE (*)    All other components within normal limits  URINE MICROSCOPIC-ADD ON - Abnormal; Notable for the following:    Squamous Epithelial / LPF 0-5 (*)    Bacteria, UA RARE (*)    All other components within normal limits  CBG MONITORING, ED - Abnormal; Notable for the following:    Glucose-Capillary 250 (*)    All other components within normal limits  CULTURE, BLOOD (ROUTINE X 2)  CULTURE, BLOOD (ROUTINE X 2)  LACTIC ACID, PLASMA  LACTIC ACID, PLASMA  INFLUENZA PANEL BY PCR (TYPE A & B, H1N1)    Imaging  Review Dg Chest 2 View  06/02/2015  CLINICAL DATA:  Patient with cough and dizziness. EXAM: CHEST  2 VIEW COMPARISON:  Chest radiograph 05/28/2014 FINDINGS: Multi lead pacer apparatus overlies right hemi thorax, leads are stable in position. No consolidative pulmonary opacities. No pleural effusion or pneumothorax. Thoracic spine degenerative changes. IMPRESSION: No acute cardiopulmonary process Electronically Signed   By: Lovey Newcomer M.D.   On: 06/02/2015 18:24   I have personally reviewed and evaluated these images and lab results as part of my medical decision-making.   EKG Interpretation None      MDM   Final diagnoses:  Fever,  unspecified fever cause  Weakness  Viral syndrome  Dehydration    Patient feels much better after fever control and IV fluids.  He is able to toe in the emergency department.  He feels great and would like to go home.  This is reasonable given his normalization of temperature and his normal vital signs while in the emergency department.  His labs, urine, chest x-ray without obvious abnormality.  I spoke with the patient and the patient's spouse at length and they understand to return to the emergency department for any new or worsening symptoms.  Blood cultures pending.    Jola Schmidt, MD 06/02/15 [phone]

## 2015-06-02 NOTE — ED Notes (Signed)
Pt states neuropathy pain at this time, MD notified.

## 2015-06-08 LAB — CULTURE, BLOOD (ROUTINE X 2)
CULTURE: NO GROWTH
Culture: NO GROWTH

## 2015-11-15 ENCOUNTER — Observation Stay (HOSPITAL_COMMUNITY): Payer: Non-veteran care

## 2015-11-15 ENCOUNTER — Encounter (HOSPITAL_COMMUNITY): Payer: Self-pay

## 2015-11-15 ENCOUNTER — Inpatient Hospital Stay (HOSPITAL_COMMUNITY)
Admission: EM | Admit: 2015-11-15 | Discharge: 2015-11-17 | DRG: 638 | Discharge status: Against Medical Advice | Payer: Non-veteran care | Attending: Internal Medicine | Admitting: Internal Medicine

## 2015-11-15 ENCOUNTER — Emergency Department (HOSPITAL_COMMUNITY): Payer: Non-veteran care

## 2015-11-15 DIAGNOSIS — F4310 Post-traumatic stress disorder, unspecified: Secondary | ICD-10-CM | POA: Diagnosis present

## 2015-11-15 DIAGNOSIS — Z950 Presence of cardiac pacemaker: Secondary | ICD-10-CM

## 2015-11-15 DIAGNOSIS — M869 Osteomyelitis, unspecified: Secondary | ICD-10-CM

## 2015-11-15 DIAGNOSIS — Z8249 Family history of ischemic heart disease and other diseases of the circulatory system: Secondary | ICD-10-CM

## 2015-11-15 DIAGNOSIS — J449 Chronic obstructive pulmonary disease, unspecified: Secondary | ICD-10-CM | POA: Diagnosis present

## 2015-11-15 DIAGNOSIS — I4891 Unspecified atrial fibrillation: Secondary | ICD-10-CM | POA: Diagnosis present

## 2015-11-15 DIAGNOSIS — L03115 Cellulitis of right lower limb: Secondary | ICD-10-CM

## 2015-11-15 DIAGNOSIS — L03119 Cellulitis of unspecified part of limb: Secondary | ICD-10-CM

## 2015-11-15 DIAGNOSIS — L02619 Cutaneous abscess of unspecified foot: Secondary | ICD-10-CM | POA: Diagnosis not present

## 2015-11-15 DIAGNOSIS — Z7902 Long term (current) use of antithrombotics/antiplatelets: Secondary | ICD-10-CM

## 2015-11-15 DIAGNOSIS — Z79899 Other long term (current) drug therapy: Secondary | ICD-10-CM

## 2015-11-15 DIAGNOSIS — Z7984 Long term (current) use of oral hypoglycemic drugs: Secondary | ICD-10-CM

## 2015-11-15 DIAGNOSIS — L03039 Cellulitis of unspecified toe: Secondary | ICD-10-CM

## 2015-11-15 DIAGNOSIS — L02611 Cutaneous abscess of right foot: Secondary | ICD-10-CM | POA: Diagnosis present

## 2015-11-15 DIAGNOSIS — E1142 Type 2 diabetes mellitus with diabetic polyneuropathy: Secondary | ICD-10-CM | POA: Diagnosis present

## 2015-11-15 DIAGNOSIS — E11628 Type 2 diabetes mellitus with other skin complications: Principal | ICD-10-CM | POA: Diagnosis present

## 2015-11-15 DIAGNOSIS — E1140 Type 2 diabetes mellitus with diabetic neuropathy, unspecified: Secondary | ICD-10-CM

## 2015-11-15 DIAGNOSIS — E039 Hypothyroidism, unspecified: Secondary | ICD-10-CM | POA: Diagnosis present

## 2015-11-15 DIAGNOSIS — I482 Chronic atrial fibrillation, unspecified: Secondary | ICD-10-CM | POA: Diagnosis present

## 2015-11-15 LAB — CBC WITH DIFFERENTIAL/PLATELET
Basophils Absolute: 0 10*3/uL (ref 0.0–0.1)
Basophils Relative: 0 %
EOS PCT: 1 %
Eosinophils Absolute: 0.1 10*3/uL (ref 0.0–0.7)
HEMATOCRIT: 37.9 % — AB (ref 39.0–52.0)
Hemoglobin: 12.3 g/dL — ABNORMAL LOW (ref 13.0–17.0)
LYMPHS ABS: 0.6 10*3/uL — AB (ref 0.7–4.0)
Lymphocytes Relative: 9 %
MCH: 30.4 pg (ref 26.0–34.0)
MCHC: 32.5 g/dL (ref 30.0–36.0)
MCV: 93.8 fL (ref 78.0–100.0)
MONOS PCT: 7 %
Monocytes Absolute: 0.4 10*3/uL (ref 0.1–1.0)
NEUTROS ABS: 5.3 10*3/uL (ref 1.7–7.7)
Neutrophils Relative %: 83 %
Platelets: 111 10*3/uL — ABNORMAL LOW (ref 150–400)
RBC: 4.04 MIL/uL — AB (ref 4.22–5.81)
RDW: 14.8 % (ref 11.5–15.5)
WBC: 6.4 10*3/uL (ref 4.0–10.5)

## 2015-11-15 LAB — COMPREHENSIVE METABOLIC PANEL
ALT: 15 U/L — ABNORMAL LOW (ref 17–63)
AST: 14 U/L — AB (ref 15–41)
Albumin: 4.2 g/dL (ref 3.5–5.0)
Alkaline Phosphatase: 41 U/L (ref 38–126)
Anion gap: 5 (ref 5–15)
BILIRUBIN TOTAL: 1.6 mg/dL — AB (ref 0.3–1.2)
BUN: 23 mg/dL — AB (ref 6–20)
CALCIUM: 9 mg/dL (ref 8.9–10.3)
CO2: 26 mmol/L (ref 22–32)
Chloride: 102 mmol/L (ref 101–111)
Creatinine, Ser: 1.2 mg/dL (ref 0.61–1.24)
GFR calc Af Amer: >60 mL/min (ref >60)
GFR, EST NON AFRICAN AMERICAN: 60 mL/min — AB (ref >60)
Glucose, Bld: 165 mg/dL — ABNORMAL HIGH (ref 65–99)
POTASSIUM: 3.7 mmol/L (ref 3.5–5.1)
Sodium: 133 mmol/L — ABNORMAL LOW (ref 135–145)
TOTAL PROTEIN: 7.3 g/dL (ref 6.5–8.1)

## 2015-11-15 LAB — URINALYSIS, ROUTINE W REFLEX MICROSCOPIC
BILIRUBIN URINE: NEGATIVE
Glucose, UA: NEGATIVE mg/dL
KETONES UR: NEGATIVE mg/dL
Leukocytes, UA: NEGATIVE
Nitrite: NEGATIVE
PROTEIN: NEGATIVE mg/dL
Specific Gravity, Urine: 1.02 (ref 1.005–1.030)
pH: 5.5 (ref 5.0–8.0)

## 2015-11-15 LAB — URINE MICROSCOPIC-ADD ON

## 2015-11-15 LAB — SEDIMENTATION RATE: Sed Rate: 22 mm/hr — ABNORMAL HIGH (ref 0–16)

## 2015-11-15 LAB — GLUCOSE, CAPILLARY: Glucose-Capillary: 166 mg/dL — ABNORMAL HIGH (ref 65–99)

## 2015-11-15 LAB — C-REACTIVE PROTEIN: CRP: 6.4 mg/dL — ABNORMAL HIGH (ref <1.0)

## 2015-11-15 LAB — I-STAT CG4 LACTIC ACID, ED: LACTIC ACID, VENOUS: 1.55 mmol/L (ref 0.5–1.9)

## 2015-11-15 MED ORDER — SODIUM CHLORIDE 0.9 % IV SOLN: 250.0000 mL | INTRAVENOUS | Status: DC | PRN

## 2015-11-15 MED ORDER — ACETAMINOPHEN 325 MG PO TABS: 650.0000 mg | ORAL_TABLET | Freq: Four times a day (QID) | ORAL | Status: DC | PRN

## 2015-11-15 MED ORDER — INSULIN ASPART 100 UNIT/ML ~~LOC~~ SOLN
0.0000 [IU] | Freq: Three times a day (TID) | SUBCUTANEOUS | Status: DC
  Administered 2015-11-16 (×2): 2 [IU] via SUBCUTANEOUS
  Administered 2015-11-16: 5 [IU] via SUBCUTANEOUS
  Administered 2015-11-17: 2 [IU] via SUBCUTANEOUS
  Administered 2015-11-17: 3 [IU] via SUBCUTANEOUS

## 2015-11-15 MED ORDER — DABIGATRAN ETEXILATE MESYLATE 150 MG PO CAPS
150.0000 mg | ORAL_CAPSULE | Freq: Two times a day (BID) | ORAL | Status: DC
  Administered 2015-11-15 – 2015-11-17 (×4): 150 mg via ORAL
  Filled 2015-11-15 (×10): qty 1

## 2015-11-15 MED ORDER — ONDANSETRON HCL 4 MG/2ML IJ SOLN: 4.0000 mg | Freq: Four times a day (QID) | INTRAMUSCULAR | Status: DC | PRN

## 2015-11-15 MED ORDER — SODIUM CHLORIDE 0.9 % IV SOLN
INTRAVENOUS | Status: DC
  Administered 2015-11-15 – 2015-11-16 (×2): via INTRAVENOUS

## 2015-11-15 MED ORDER — METFORMIN HCL 500 MG PO TABS
1000.0000 mg | ORAL_TABLET | Freq: Two times a day (BID) | ORAL | Status: DC
  Filled 2015-11-15: qty 2

## 2015-11-15 MED ORDER — ATORVASTATIN CALCIUM 40 MG PO TABS
40.0000 mg | ORAL_TABLET | Freq: Every day | ORAL | Status: DC
  Administered 2015-11-15 – 2015-11-17 (×3): 40 mg via ORAL
  Filled 2015-11-15 (×6): qty 1

## 2015-11-15 MED ORDER — FINASTERIDE 5 MG PO TABS
ORAL_TABLET | ORAL | Status: AC
  Filled 2015-11-15: qty 1

## 2015-11-15 MED ORDER — FINASTERIDE 5 MG PO TABS
5.0000 mg | ORAL_TABLET | Freq: Every day | ORAL | Status: DC
  Administered 2015-11-15 – 2015-11-16 (×2): 5 mg via ORAL
  Filled 2015-11-15 (×5): qty 1

## 2015-11-15 MED ORDER — DIPHENHYDRAMINE-APAP (SLEEP) 25-500 MG PO TABS: 2.0000 | ORAL_TABLET | Freq: Every day | ORAL | Status: DC

## 2015-11-15 MED ORDER — SENNA 8.6 MG PO TABS
1.0000 | ORAL_TABLET | Freq: Two times a day (BID) | ORAL | Status: DC
  Administered 2015-11-15 – 2015-11-17 (×4): 8.6 mg via ORAL
  Filled 2015-11-15 (×8): qty 1

## 2015-11-15 MED ORDER — SODIUM CHLORIDE 0.9% FLUSH: 3.0000 mL | INTRAVENOUS | Status: DC | PRN

## 2015-11-15 MED ORDER — OXYCODONE HCL 5 MG PO TABS
5.0000 mg | ORAL_TABLET | Freq: Three times a day (TID) | ORAL | Status: DC | PRN
  Administered 2015-11-15 – 2015-11-16 (×2): 5 mg via ORAL
  Filled 2015-11-15 (×2): qty 1

## 2015-11-15 MED ORDER — PANTOPRAZOLE SODIUM 40 MG PO TBEC
40.0000 mg | DELAYED_RELEASE_TABLET | Freq: Every day | ORAL | Status: DC
  Administered 2015-11-15 – 2015-11-17 (×3): 40 mg via ORAL
  Filled 2015-11-15 (×3): qty 1

## 2015-11-15 MED ORDER — POLYETHYLENE GLYCOL 3350 17 G PO PACK: 17.0000 g | PACK | Freq: Every day | ORAL | Status: DC | PRN

## 2015-11-15 MED ORDER — DOCUSATE SODIUM 100 MG PO CAPS
100.0000 mg | ORAL_CAPSULE | Freq: Two times a day (BID) | ORAL | Status: DC
  Administered 2015-11-15 – 2015-11-17 (×4): 100 mg via ORAL
  Filled 2015-11-15 (×8): qty 1

## 2015-11-15 MED ORDER — DIPHENHYDRAMINE HCL 25 MG PO CAPS
50.0000 mg | ORAL_CAPSULE | Freq: Every day | ORAL | Status: DC
  Administered 2015-11-15 – 2015-11-16 (×2): 50 mg via ORAL
  Filled 2015-11-15 (×2): qty 2

## 2015-11-15 MED ORDER — DEXTROSE 5 % IV SOLN
2.0000 g | Freq: Once | INTRAVENOUS | Status: AC
  Administered 2015-11-15: 2 g via INTRAVENOUS
  Filled 2015-11-15: qty 2

## 2015-11-15 MED ORDER — LEVOTHYROXINE SODIUM 100 MCG PO TABS
200.0000 ug | ORAL_TABLET | Freq: Every day | ORAL | Status: DC
  Administered 2015-11-16 – 2015-11-17 (×2): 200 ug via ORAL
  Filled 2015-11-15 (×2): qty 2

## 2015-11-15 MED ORDER — CLINDAMYCIN PHOSPHATE 600 MG/50ML IV SOLN
600.0000 mg | Freq: Four times a day (QID) | INTRAVENOUS | Status: DC
  Administered 2015-11-15 – 2015-11-17 (×7): 600 mg via INTRAVENOUS
  Filled 2015-11-15 (×19): qty 50

## 2015-11-15 MED ORDER — FLUTICASONE PROPIONATE 50 MCG/ACT NA SUSP
1.0000 | Freq: Every day | NASAL | Status: DC | PRN
  Filled 2015-11-15: qty 16

## 2015-11-15 MED ORDER — DABIGATRAN ETEXILATE MESYLATE 150 MG PO CAPS
ORAL_CAPSULE | ORAL | Status: AC
  Filled 2015-11-15: qty 1

## 2015-11-15 MED ORDER — ACETAMINOPHEN 650 MG RE SUPP
650.0000 mg | Freq: Four times a day (QID) | RECTAL | Status: DC | PRN
Start: 2015-11-15 — End: 2015-11-17

## 2015-11-15 MED ORDER — VANCOMYCIN HCL IN DEXTROSE 1-5 GM/200ML-% IV SOLN
1000.0000 mg | Freq: Once | INTRAVENOUS | Status: AC
  Administered 2015-11-15: 1000 mg via INTRAVENOUS
  Filled 2015-11-15: qty 200

## 2015-11-15 MED ORDER — ACETAMINOPHEN 500 MG PO TABS: 500.0000 mg | ORAL_TABLET | Freq: Four times a day (QID) | ORAL | Status: DC | PRN

## 2015-11-15 MED ORDER — AMITRIPTYLINE HCL 25 MG PO TABS
100.0000 mg | ORAL_TABLET | Freq: Once | ORAL | Status: DC
  Filled 2015-11-15: qty 4

## 2015-11-15 MED ORDER — PREGABALIN 50 MG PO CAPS
200.0000 mg | ORAL_CAPSULE | Freq: Three times a day (TID) | ORAL | Status: DC
  Administered 2015-11-15 – 2015-11-17 (×5): 200 mg via ORAL
  Filled 2015-11-15: qty 2
  Filled 2015-11-15 (×4): qty 1

## 2015-11-15 MED ORDER — ALBUTEROL SULFATE (2.5 MG/3ML) 0.083% IN NEBU: 3.0000 mL | INHALATION_SOLUTION | Freq: Four times a day (QID) | RESPIRATORY_TRACT | Status: DC | PRN

## 2015-11-15 MED ORDER — CLINDAMYCIN PHOSPHATE 600 MG/50ML IV SOLN
INTRAVENOUS | Status: AC
  Filled 2015-11-15: qty 100

## 2015-11-15 MED ORDER — TRAZODONE HCL 50 MG PO TABS
50.0000 mg | ORAL_TABLET | Freq: Every evening | ORAL | Status: DC | PRN
  Filled 2015-11-15 (×2): qty 1

## 2015-11-15 MED ORDER — OXYCODONE HCL 5 MG PO TABS: 5.0000 mg | ORAL_TABLET | ORAL | Status: DC | PRN

## 2015-11-15 MED ORDER — ONDANSETRON HCL 4 MG PO TABS: 4.0000 mg | ORAL_TABLET | Freq: Four times a day (QID) | ORAL | Status: DC | PRN

## 2015-11-15 MED ORDER — SODIUM CHLORIDE 0.9% FLUSH: 3.0000 mL | Freq: Two times a day (BID) | INTRAVENOUS | Status: DC

## 2015-11-15 MED ORDER — TRAZODONE HCL 50 MG PO TABS
50.0000 mg | ORAL_TABLET | Freq: Every evening | ORAL | Status: DC | PRN
  Administered 2015-11-15 – 2015-11-16 (×2): 50 mg via ORAL

## 2015-11-15 MED ORDER — ACETAMINOPHEN 500 MG PO TABS
1000.0000 mg | ORAL_TABLET | Freq: Every day | ORAL | Status: DC
  Administered 2015-11-15 – 2015-11-16 (×2): 1000 mg via ORAL
  Filled 2015-11-15 (×2): qty 2

## 2015-11-15 MED ORDER — NORTRIPTYLINE HCL 25 MG PO CAPS
100.0000 mg | ORAL_CAPSULE | Freq: Once | ORAL | Status: AC
  Administered 2015-11-15: 100 mg via ORAL
  Filled 2015-11-15: qty 4

## 2015-11-15 MED ORDER — ALBUTEROL SULFATE (2.5 MG/3ML) 0.083% IN NEBU: 2.5000 mg | INHALATION_SOLUTION | RESPIRATORY_TRACT | Status: DC | PRN

## 2015-11-15 NOTE — H&P (Addendum)
Patient Demographics:    Tony [name], is a 70 y.o. male  MRN: [id number]   DOB - [birth year]  Admit Date - 11/15/2015  Outpatient Primary MD for the patient is [facility]   Assessment & Plan:    Principal Problem:   Cellulitis and abscess of Right foot(4th Toe) Active Problems:   Diabetic neuropathy (HCC)   A-fib (HCC)   COPD (chronic obstructive pulmonary disease) (HCC)   Cellulitis and abscess of toe    1)Rt Foot (4th Toe) Cellulitis- ?? Osteomyelitis- Diabetic male with trauma to right fourth toe with secondary cellulitis of less than 24 hours duration, subjective chills and fevers, no documented fevers in the ED, no leukocytosis, ESR is 22. Foot x-ray report noted, air in the soft tissues probably due to open wound , rather than frank anaerobic infection, however we are unable to exclude osteomyelitis based on x-ray report. Unable to do MRI of right foot to exclude a stomatitis due to pacemaker in situ, bone scar will probably be falsely positive has patient has open wound, treat empirically with IV clindamycin pending cultures. Get wound care consult. Patient has no pain or tenderness due to severe peripheral neuropathy in the setting of diabetes mellitus  2)DM- previously well controlled according to patient, he does have significant diabetic neuropathy of both feet, continue metformin, Use Novolog/Humalog Sliding scale insulin with Accu-Cheks/Fingersticks as ordered. Continue Lyrica for diabetic neuropathy  3)Afib- stable, and Pradaxa for anticoagulation (stroke prophylaxis), patient has a pacemaker in situ  4)PTSD- patient is a [place] veteran, he has a Neurosurgeon that helps him with his anxiety, continue trazodone daily at bedtime  5)COPD- no acute exacerbation, patient is a reformed smoker,  continue bronchodilators    With History of - Reviewed by me  Past Medical History:  Diagnosis Date  . Atrial fibrillation ([place])   . COPD (chronic obstructive pulmonary disease) ([place])   . Diabetes mellitus   . Neuropathy ([place])   . Pacemaker   . PTSD (post-traumatic stress disorder)   . Spinal stenosis       Past Surgical History:  Procedure Laterality Date  . CHOLECYSTECTOMY        Chief Complaint  Patient presents with  . Wound Infection      HPI:    Tony [name]  is a 70 y.o. male, With past medical history relevant for diabetes mellitus with significant peripheral neuropathy of both feet, as well as Atrial fibrillation on anticoagulation Pradaxa who presents to the ED with concerns about right fourth toe swelling, redness and warmth. Patient has no pain sensation due to peripheral neuropathy in the setting of diabetes. He has subjective fever and chills today. According to patient and his wife the right foot looked fine and yesterday, patient noticed an open wound over the right fourth toe today with area of redness that has progressively gotten worse very quickly over the last several hours. The redness and streaking  is spreading cephalad direction very quickly. No nausea vomiting or diarrhea. No chest pains palpitations or dizziness and no shortness of breath, no pleuritic symptoms. Patient's wife is at bedside, questions answered, patient has a service dog with him. Despite open wound over right fourth toe area patient has no recollection of any injury, please note that he does not have any sensation or pain on his feet due to severe peripheral neuropathy  In ED- x-rays of right foot shows possible osteomyelitis, unable to do MRI as patient has pacemaker. Patient received vancomycin and cefepime in the ED. ESR was 22 and cultures were obtained    Review of systems:    In addition to the HPI above,   A full 12 point Review of 10 Systems was done, except as stated above,  all other Review of 10 Systems were negative.    Social History:  Reviewed by me    Social History  Substance Use Topics  . Smoking status: Never Smoker  . Smokeless tobacco: Never Used  . Alcohol use No       Family History :  Reviewed by me    Family History  Problem Relation Age of Onset  . Heart disease Mother   . Prostate cancer Brother   . Kidney cancer Brother      Home Medications:   Prior to Admission medications   Medication Sig Start Date End Date Taking? Authorizing Provider  acetaminophen (TYLENOL) 500 MG tablet Take 500 mg by mouth every 6 (six) hours as needed for mild pain.    Yes Historical Provider, MD  albuterol (PROVENTIL HFA;VENTOLIN HFA) 108 (90 BASE) MCG/ACT inhaler Inhale 2 puffs into the lungs every 6 (six) hours as needed. Shortness of breath    Yes Historical Provider, MD  atorvastatin (LIPITOR) 80 MG tablet Take 40 mg by mouth daily.   Yes Historical Provider, MD  dabigatran (PRADAXA) 150 MG CAPS capsule Take 150 mg by mouth every 12 (twelve) hours.   Yes Historical Provider, MD  diphenhydramine-acetaminophen (TYLENOL PM) 25-500 MG TABS Take 2 tablets by mouth at bedtime.   Yes Historical Provider, MD  docusate sodium (COLACE) 100 MG capsule Take 100 mg by mouth 2 (two) times daily.   Yes Historical Provider, MD  finasteride (PROSCAR) 5 MG tablet Take 5 mg by mouth at bedtime.    Yes Historical Provider, MD  fluticasone (FLONASE) 50 MCG/ACT nasal spray Place 1 spray into both nostrils daily as needed for allergies or rhinitis.   Yes Historical Provider, MD  levothyroxine (SYNTHROID, LEVOTHROID) 100 MCG tablet Take 200 mcg by mouth every morning.    Yes Historical Provider, MD  metFORMIN (GLUCOPHAGE) 500 MG tablet Take 1,000 mg by mouth 2 (two) times daily.    Yes Historical Provider, MD  mometasone [facility]) 220 MCG/INH inhaler Inhale 2 puffs into the lungs 2 (two) times daily as needed. May use at home medication   Yes Historical Provider, MD    omeprazole (PRILOSEC) 20 MG capsule Take 20 mg by mouth at bedtime.    Yes Historical Provider, MD  oxyCODONE (OXY IR/ROXICODONE) 5 MG immediate release tablet Take 5 mg by mouth 3 (three) times daily as needed for moderate pain.    Yes Historical Provider, MD  pregabalin (LYRICA) 100 MG capsule Take 200 mg by mouth 3 (three) times daily.   Yes Historical Provider, MD  traZODone (DESYREL) 50 MG tablet Take 50 mg by mouth at bedtime as needed for sleep.    Yes Historical  Provider, MD     Allergies:    No Known Allergies   Physical Exam:   Vitals  Blood pressure 117/62, pulse 61, temperature 99.1 F (37.3 C), temperature source Oral, resp. rate 17, height _0  (1.854 m), weight 97.5 kg (215 lb), SpO2 100 %.  Physical Examination: General appearance - alert, obese appearing, and in no distress Mental status - alert, oriented to person, place, and time,  Eyes - sclera anicteric Neck - supple, no JVD elevation , Chest - clear  to auscultation bilaterally, symmetrical air movement,  Heart - S1 and S2 normal, pacemaker in situ Abdomen - soft, nontender, nondistended, increased truncal adiposity Neurological - screening mental status exam normal, neck supple without rigidity, cranial nerves II through XII intact, DTR's normal and symmetric Extremities - left foot is slightly "cool", according to patient and his wife this is normal for him, right foot fourth toe with open wound, swelling, ecchymotic/bruised area, warmth , redness and streaking spreading in a cephalad direction. Patient has no tenderness due to severe peripheral neuropathy. Pedal pulses are present Skin - warm, dry    Data Review:    CBC  Recent Labs Lab 11/15/15 1601  WBC 6.4  HGB 12.3*  HCT 37.9*  PLT 111*  MCV 93.8  MCH 30.4  MCHC 32.5  RDW 14.8  LYMPHSABS 0.6*  MONOABS 0.4  EOSABS 0.1  BASOSABS 0.0    ------------------------------------------------------------------------------------------------------------------  Chemistries   Recent Labs Lab 11/15/15 1601  NA 133*  K 3.7  CL 102  CO2 26  GLUCOSE 165*  BUN 23*  CREATININE 1.20  CALCIUM 9.0  AST 14*  ALT 15*  ALKPHOS 41  BILITOT 1.6*   ------------------------------------------------------------------------------------------------------------------ estimated creatinine clearance is 70.4 mL/min (by C-G formula based on SCr of 1.2 mg/dL). ------------------------------------------------------------------------------------------------------------------ No results for input(s): TSH, T4TOTAL, T3FREE, THYROIDAB in the last 72 hours.  Invalid input(s): FREET3   Coagulation profile No results for input(s): INR, PROTIME in the last 168 hours. ------------------------------------------------------------------------------------------------------------------- No results for input(s): DDIMER in the last 72 hours. -------------------------------------------------------------------------------------------------------------------  Cardiac Enzymes No results for input(s): CKMB, TROPONINI, MYOGLOBIN in the last 168 hours.  Invalid input(s): CK ------------------------------------------------------------------------------------------------------------------    Component Value Date/Time   BNP 174.0 (H) 05/28/2014 1719     ---------------------------------------------------------------------------------------------------------------  Urinalysis    Component Value Date/Time   COLORURINE YELLOW 06/02/2015 1957   APPEARANCEUR CLEAR 06/02/2015 1957   LABSPEC 1.020 06/02/2015 1957   PHURINE 5.5 06/02/2015 1957   GLUCOSEU 250 (A) 06/02/2015 1957   HGBUR MODERATE (A) 06/02/2015 1957   BILIRUBINUR NEGATIVE 06/02/2015 1957   KETONESUR NEGATIVE 06/02/2015 1957   PROTEINUR NEGATIVE 06/02/2015 1957   UROBILINOGEN 0.2 05/28/2014 1850    NITRITE NEGATIVE 06/02/2015 1957   LEUKOCYTESUR NEGATIVE 06/02/2015 1957    ----------------------------------------------------------------------------------------------------------------   Imaging Results:    Dg Foot Complete Right  Result Date: 11/15/2015 CLINICAL DATA:  Wound infection right foot. EXAM: RIGHT FOOT COMPLETE - 3+ VIEW COMPARISON:  08/23/2014. FINDINGS: Diffuse degenerative change right foot. Degenerative changes are particularly prominent at the first metatarsophalangeal joint and the first distal interphalangeal joint. No evidence of fracture or dislocation. Prior partial amputations of the distal second, third, fourth, and fifth digits. Soft tissue air is noted in the right fourth digit. Soft tissue swelling is also present. Erosive changes noted of the remaining portion of the middle phalanx of the right fourth digit. Osteomyelitis cannot be excluded . IMPRESSION: 1. Soft-tissue swelling soft tissue air right fourth digit. Mild erosive changes of  the remaining portion of the middle phalanx of the right fourth digit cannot be excluded. Osteomyelitis cannot be excluded. 2.  Degenerative changes and postsurgical changes as above . Electronically Signed   By: Marcello Moores  Register   On: 11/15/2015 16:37    Radiological Exams on Admission: Dg Foot Complete Right  Result Date: 11/15/2015 CLINICAL DATA:  Wound infection right foot. EXAM: RIGHT FOOT COMPLETE - 3+ VIEW COMPARISON:  08/23/2014. FINDINGS: Diffuse degenerative change right foot. Degenerative changes are particularly prominent at the first metatarsophalangeal joint and the first distal interphalangeal joint. No evidence of fracture or dislocation. Prior partial amputations of the distal second, third, fourth, and fifth digits. Soft tissue air is noted in the right fourth digit. Soft tissue swelling is also present. Erosive changes noted of the remaining portion of the middle phalanx of the right fourth digit. Osteomyelitis  cannot be excluded . IMPRESSION: 1. Soft-tissue swelling soft tissue air right fourth digit. Mild erosive changes of the remaining portion of the middle phalanx of the right fourth digit cannot be excluded. Osteomyelitis cannot be excluded. 2.  Degenerative changes and postsurgical changes as above . Electronically Signed   By: Marcello Moores  Register   On: 11/15/2015 16:37    DVT Prophylaxis - Pradaxa AM Labs Ordered, also please review Full Orders  Family Communication: Admission, patients condition and plan of care including tests being ordered have been discussed with the patient and wife at bedside who indicate understanding and agree with the plan   Code Status - Full Code  Likely DC to  Home in a couple days  Condition   stable  Mitchelle Goerner M.D on 11/15/2015 at 7:19 PM   Between 7am to 7pm - Pager - [phone]  After 7pm go to [email or web address] - password [email or web address]  [facility] - Office  [phone]  Dragon dictation system was used to create this note, attempts have been made to correct errors, however presence of uncorrected errors is not a reflection quality of care provided.

## 2015-11-15 NOTE — ED Provider Notes (Signed)
[facility] DEPT Provider Note   CSN: [id number] Arrival date & time: 11/15/15  1456     History   Chief Complaint Chief Complaint  Patient presents with  . Wound Infection    HPI Tony [name] is a 70 y.o. male.  HPI  70 year old male who presents with concern for wound infection. He has a history of diabetes complicated by diabetic neuropathy, atrial fibrillation on pradaxa, and with PPM. Reports being in usual state of health until yesterday afternoon, when he felt progressive fatigue and generalized weakness. Slept for most of the afternoon and evening until this morning. This morning, noticed wound on right foot over 2nd toe with new redness. Due to neuropathy, no associating pain. Did not notice this wound yesterday. No fever or chills, cough, dyspnea, chest pain, abd pain, n/v/d, or urinary symptoms.   Past Medical History:  Diagnosis Date  . Atrial fibrillation ([place])   . COPD (chronic obstructive pulmonary disease) ([place])   . Diabetes mellitus   . Neuropathy ([place])   . Pacemaker   . PTSD (post-traumatic stress disorder)   . Spinal stenosis     Patient Active Problem List   Diagnosis Date Noted  . Cellulitis of right lower extremity 08/23/2014  . Venous stasis ulcer of left leg 05/12/2012  . 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 Supratherapeutic INR 04/03/2012  . Diabetic ulcer of lower extremity ([place]) 04/02/2012  . Cellulitis of left lower extremity 04/02/2012  . Diabetic neuropathy ([place]) 01/13/2011  . A-fib ([place]) 01/13/2011  . Gout 01/13/2011  . BPH (benign prostatic hyperplasia) 01/13/2011  . Hypothyroidism 01/13/2011  . Diabetes type 2, controlled ([place]) 01/13/2011  . COPD (chronic obstructive pulmonary disease) ([place]) 01/13/2011    Past Surgical History:  Procedure Laterality Date  . CHOLECYSTECTOMY         Home Medications    Prior to Admission medications   Medication Sig Start Date End Date Taking? Authorizing Provider  acetaminophen (TYLENOL)  500 MG tablet Take 500 mg by mouth every 6 (six) hours as needed for mild pain.    Yes Historical Provider, MD  albuterol (PROVENTIL HFA;VENTOLIN HFA) 108 (90 BASE) MCG/ACT inhaler Inhale 2 puffs into the lungs every 6 (six) hours as needed. Shortness of breath    Yes Historical Provider, MD  atorvastatin (LIPITOR) 80 MG tablet Take 40 mg by mouth daily.   Yes Historical Provider, MD  dabigatran (PRADAXA) 150 MG CAPS capsule Take 150 mg by mouth every 12 (twelve) hours.   Yes Historical Provider, MD  diphenhydramine-acetaminophen (TYLENOL PM) 25-500 MG TABS Take 2 tablets by mouth at bedtime.   Yes Historical Provider, MD  docusate sodium (COLACE) 100 MG capsule Take 100 mg by mouth 2 (two) times daily.   Yes Historical Provider, MD  finasteride (PROSCAR) 5 MG tablet Take 5 mg by mouth at bedtime.    Yes Historical Provider, MD  fluticasone (FLONASE) 50 MCG/ACT nasal spray Place 1 spray into both nostrils daily as needed for allergies or rhinitis.   Yes Historical Provider, MD  levothyroxine (SYNTHROID, LEVOTHROID) 100 MCG tablet Take 200 mcg by mouth every morning.    Yes Historical Provider, MD  metFORMIN (GLUCOPHAGE) 500 MG tablet Take 1,000 mg by mouth 2 (two) times daily.    Yes Historical Provider, MD  mometasone [facility]) 220 MCG/INH inhaler Inhale 2 puffs into the lungs 2 (two) times daily as needed. May use at home medication   Yes Historical Provider, MD  omeprazole (PRILOSEC) 20 MG capsule Take 20  mg by mouth at bedtime.    Yes Historical Provider, MD  oxyCODONE (OXY IR/ROXICODONE) 5 MG immediate release tablet Take 5 mg by mouth 3 (three) times daily as needed for moderate pain.    Yes Historical Provider, MD  pregabalin (LYRICA) 100 MG capsule Take 200 mg by mouth 3 (three) times daily.   Yes Historical Provider, MD  traZODone (DESYREL) 50 MG tablet Take 50 mg by mouth at bedtime as needed for sleep.    Yes Historical Provider, MD    Family History Family History  Problem Relation Age  of Onset  . Heart disease Mother   . Prostate cancer Brother   . Kidney cancer Brother     Social History Social History  Substance Use Topics  . Smoking status: Never Smoker  . Smokeless tobacco: Never Used  . Alcohol use No     Allergies   Review of patient's allergies indicates no known allergies.   Review of Systems Review of Systems 10/14 systems reviewed and are negative other than those stated in the HPI   Physical Exam Updated Vital Signs BP 117/62   Pulse 61   Temp 99.1 F (37.3 C) (Oral)   Resp 17   Ht 6\' 1"  (1.854 m)   Wt 215 lb (97.5 kg)   SpO2 100%   BMI 28.37 kg/m   Physical Exam Physical Exam  Nursing note and vitals reviewed. Constitutional: Well developed, well nourished, non-toxic, and in no acute distress Head: Normocephalic and atraumatic.  Mouth/Throat: Oropharynx is clear and moist.  Neck: Normal range of motion. Neck supple.  Cardiovascular: Normal rate and regular rhythm.  +2 DP pulses bilaterally Pulmonary/Chest: Effort normal and breath sounds normal.  Abdominal: Soft. There is no tenderness. There is no rebound and no guarding.  Musculoskeletal: Normal range of motion.  Neurological: Alert, no facial droop, fluent speech, moves all extremities symmetrically Skin: Skin is warm and dry. there is open blister or dorsum of right 2nd toe. Surrounding erythema and warmth to dorsum of the foot.  Psychiatric: Cooperative   ED Treatments / Results  Labs (all labs ordered are listed, but only abnormal results are displayed) Labs Reviewed  CBC WITH DIFFERENTIAL/PLATELET - Abnormal; Notable for the following:       Result Value   RBC 4.04 (*)    Hemoglobin 12.3 (*)    HCT 37.9 (*)    Platelets 111 (*)    Lymphs Abs 0.6 (*)    All other components within normal limits  COMPREHENSIVE METABOLIC PANEL - Abnormal; Notable for the following:    Sodium 133 (*)    Glucose, Bld 165 (*)    BUN 23 (*)    AST 14 (*)    ALT 15 (*)    Total  Bilirubin 1.6 (*)    GFR calc non Af Amer 60 (*)    All other components within normal limits  SEDIMENTATION RATE - Abnormal; Notable for the following:    Sed Rate 22 (*)    All other components within normal limits  CULTURE, BLOOD (ROUTINE X 2)  CULTURE, BLOOD (ROUTINE X 2)  URINALYSIS, ROUTINE W REFLEX MICROSCOPIC (NOT AT [facility])  C-REACTIVE PROTEIN  I-STAT CG4 LACTIC ACID, ED  I-STAT CG4 LACTIC ACID, ED    EKG  EKG Interpretation  Date/Time:  Wednesday November 15 2015 16:05:46 EDT Ventricular Rate:  72 PR Interval:    QRS Duration: 112 QT Interval:  417 QTC Calculation: 457 R Axis:   98 Text  Interpretation:  Atrial-paced complexes Prolonged PR interval Borderline intraventricular conduction delay Nonspecific repol abnormality, lateral leads history of pacemaker, atrially paced rhythm  Confirmed by Gerron Guidotti MD, Effie Wahlert [phone]) on 11/15/2015 4:21:54 PM       Radiology Dg Foot Complete Right  Result Date: 11/15/2015 CLINICAL DATA:  Wound infection right foot. EXAM: RIGHT FOOT COMPLETE - 3+ VIEW COMPARISON:  08/23/2014. FINDINGS: Diffuse degenerative change right foot. Degenerative changes are particularly prominent at the first metatarsophalangeal joint and the first distal interphalangeal joint. No evidence of fracture or dislocation. Prior partial amputations of the distal second, third, fourth, and fifth digits. Soft tissue air is noted in the right fourth digit. Soft tissue swelling is also present. Erosive changes noted of the remaining portion of the middle phalanx of the right fourth digit. Osteomyelitis cannot be excluded . IMPRESSION: 1. Soft-tissue swelling soft tissue air right fourth digit. Mild erosive changes of the remaining portion of the middle phalanx of the right fourth digit cannot be excluded. Osteomyelitis cannot be excluded. 2.  Degenerative changes and postsurgical changes as above . Electronically Signed   By: Marcello Moores  Register   On: 11/15/2015 16:37     Procedures Procedures (including critical care time)  Medications Ordered in ED Medications  vancomycin (VANCOCIN) IVPB 1000 mg/200 mL premix (not administered)  ceFEPIme (MAXIPIME) 2 g in dextrose 5 % 50 mL IVPB (2 g Intravenous New Bag/Given 11/15/15 1820)     Initial Impression / Assessment and Plan / ED Course  I have reviewed the triage vital signs and the nursing notes.  Pertinent labs & imaging results that were available during my care of the patient were reviewed by me and considered in my medical decision making (see chart for details).  Clinical Course   Presenting with cellulitis of right foot, progressive over several hours. Afebrile and HD stable. W/o leukocytosis or elevated lactic acid. XRay foot with ? Of bony changes. Cannot rule out osteomyelitis. Is diabetic with rapidly progressing cellulitis. NO gas or symptoms c/f nectrotizing infection. Discussed with hospitalist. Will admit for observation w/ vancomycin and cefepime.   Final Clinical Impressions(s) / ED Diagnoses   Final diagnoses:  Cellulitis of right foot  Type 2 diabetes mellitus with diabetic neuropathy, without long-term current use of insulin [facility])    New Prescriptions New Prescriptions   No medications on file     Forde Dandy, MD 11/15/15 [phone]

## 2015-11-15 NOTE — ED Triage Notes (Signed)
Wound to right foot since this morning. Reports of fevers. Patient is diabetic.

## 2015-11-16 ENCOUNTER — Observation Stay (HOSPITAL_COMMUNITY): Payer: Non-veteran care

## 2015-11-16 DIAGNOSIS — I4891 Unspecified atrial fibrillation: Secondary | ICD-10-CM | POA: Diagnosis present

## 2015-11-16 DIAGNOSIS — I481 Persistent atrial fibrillation: Secondary | ICD-10-CM | POA: Diagnosis not present

## 2015-11-16 DIAGNOSIS — L03031 Cellulitis of right toe: Secondary | ICD-10-CM | POA: Diagnosis not present

## 2015-11-16 DIAGNOSIS — Z8249 Family history of ischemic heart disease and other diseases of the circulatory system: Secondary | ICD-10-CM | POA: Diagnosis not present

## 2015-11-16 DIAGNOSIS — Z950 Presence of cardiac pacemaker: Secondary | ICD-10-CM | POA: Diagnosis not present

## 2015-11-16 DIAGNOSIS — L02619 Cutaneous abscess of unspecified foot: Secondary | ICD-10-CM | POA: Diagnosis not present

## 2015-11-16 DIAGNOSIS — Z7902 Long term (current) use of antithrombotics/antiplatelets: Secondary | ICD-10-CM | POA: Diagnosis not present

## 2015-11-16 DIAGNOSIS — L03115 Cellulitis of right lower limb: Secondary | ICD-10-CM | POA: Diagnosis present

## 2015-11-16 DIAGNOSIS — Z7984 Long term (current) use of oral hypoglycemic drugs: Secondary | ICD-10-CM | POA: Diagnosis not present

## 2015-11-16 DIAGNOSIS — Z79899 Other long term (current) drug therapy: Secondary | ICD-10-CM | POA: Diagnosis not present

## 2015-11-16 DIAGNOSIS — E11628 Type 2 diabetes mellitus with other skin complications: Secondary | ICD-10-CM | POA: Diagnosis present

## 2015-11-16 DIAGNOSIS — J449 Chronic obstructive pulmonary disease, unspecified: Secondary | ICD-10-CM | POA: Diagnosis present

## 2015-11-16 DIAGNOSIS — E1142 Type 2 diabetes mellitus with diabetic polyneuropathy: Secondary | ICD-10-CM | POA: Diagnosis present

## 2015-11-16 DIAGNOSIS — E039 Hypothyroidism, unspecified: Secondary | ICD-10-CM | POA: Diagnosis present

## 2015-11-16 DIAGNOSIS — L02611 Cutaneous abscess of right foot: Secondary | ICD-10-CM | POA: Diagnosis not present

## 2015-11-16 DIAGNOSIS — F4310 Post-traumatic stress disorder, unspecified: Secondary | ICD-10-CM | POA: Diagnosis present

## 2015-11-16 DIAGNOSIS — L03119 Cellulitis of unspecified part of limb: Secondary | ICD-10-CM | POA: Diagnosis not present

## 2015-11-16 LAB — GLUCOSE, CAPILLARY
GLUCOSE-CAPILLARY: 278 mg/dL — AB (ref 65–99)
GLUCOSE-CAPILLARY: 195 mg/dL — AB (ref 65–99)
Glucose-Capillary: 185 mg/dL — ABNORMAL HIGH (ref 65–99)
Glucose-Capillary: 189 mg/dL — ABNORMAL HIGH (ref 65–99)

## 2015-11-16 LAB — CBC
HEMATOCRIT: 33.9 % — AB (ref 39.0–52.0)
Hemoglobin: 11.1 g/dL — ABNORMAL LOW (ref 13.0–17.0)
MCH: 30.7 pg (ref 26.0–34.0)
MCHC: 32.7 g/dL (ref 30.0–36.0)
MCV: 93.6 fL (ref 78.0–100.0)
PLATELETS: 102 10*3/uL — AB (ref 150–400)
RBC: 3.62 MIL/uL — ABNORMAL LOW (ref 4.22–5.81)
RDW: 14.6 % (ref 11.5–15.5)
WBC: 4.1 10*3/uL (ref 4.0–10.5)

## 2015-11-16 LAB — BASIC METABOLIC PANEL
Anion gap: 5 (ref 5–15)
BUN: 21 mg/dL — AB (ref 6–20)
CALCIUM: 8.7 mg/dL — AB (ref 8.9–10.3)
CO2: 27 mmol/L (ref 22–32)
CREATININE: 1 mg/dL (ref 0.61–1.24)
Chloride: 102 mmol/L (ref 101–111)
GFR calc Af Amer: >60 mL/min (ref >60)
GLUCOSE: 233 mg/dL — AB (ref 65–99)
POTASSIUM: 3.6 mmol/L (ref 3.5–5.1)
SODIUM: 134 mmol/L — AB (ref 135–145)

## 2015-11-16 MED ORDER — IOPAMIDOL (ISOVUE-300) INJECTION 61%
75.0000 mL | Freq: Once | INTRAVENOUS | Status: AC | PRN
  Administered 2015-11-16: 75 mL via INTRAVENOUS

## 2015-11-16 NOTE — Progress Notes (Signed)
PROGRESS NOTE    Tony [name]  [id number] DOB: December 23, 1945 DOA: 11/15/2015 PCP: [facility]    Brief Narrative: patient from [facility] hospital with hx of afib, COPD, diabetic neuropathy, sp ppm, admitted for cellulitis, with concerns for osteomyelitis.  He said his swelling has only happened a few days ago, as he has been very vigilant over his feet. He was started on Clinda and IV Cefepime.  His Cr is 1.0.     Assessment & Plan:   Principal Problem:   Cellulitis and abscess of Right foot(4th Toe) Active Problems:   Diabetic neuropathy (HCC)   A-fib (HCC)   COPD (chronic obstructive pulmonary disease) (HCC)   Cellulitis and abscess of toe   Cellulitis:  Will continue with current IV antibiotics.  I don't think he has osteomyelitis, as the duration of infection was only a few days.  Will obtain CT can of his right foot to exclude it.   DM:  SSI, will hold metformin after CT of foot.  Afib:  Rate is controlled.  Continue with Pradaxa.    DVT prophylaxis:  Pradaxa.  Code Status: FULL CODE.  Family Communication:None.  Disposition Plan: To home when appropriate.   Consultants:   None.   Procedures:   None.   Antimicrobials: Anti-infectives    Start     Dose/Rate Route Frequency Ordered Stop   11/15/15 2000  clindamycin (CLEOCIN) IVPB 600 mg     600 mg 100 mL/hr over 30 Minutes Intravenous Every 6 hours 11/15/15 1916     11/15/15 1815  vancomycin (VANCOCIN) IVPB 1000 mg/200 mL premix     1,000 mg 200 mL/hr over 60 Minutes Intravenous  Once 11/15/15 1810 11/15/15 2147   11/15/15 1815  ceFEPIme (MAXIPIME) 2 g in dextrose 5 % 50 mL IVPB     2 g 100 mL/hr over 30 Minutes Intravenous  Once 11/15/15 1810 11/15/15 2039       Subjective:   Objective: Vitals:   11/15/15 1700 11/15/15 1800 11/15/15 2104 11/16/15 0615  BP: 112/69 117/62 129/71 122/64  Pulse:  61 70 68  Resp: 18 17 18 20   Temp:   99.3 F (37.4 C) 98.9 F (37.2 C)  TempSrc:   Oral Oral    SpO2:  100% 100% 100%  Weight:   97.3 kg (214 lb 9.9 oz)   Height:   6\' 1"  (1.854 m)     Intake/Output Summary (Last 24 hours) at 11/16/15 0837 Last data filed at 11/16/15 0615  Gross per 24 hour  Intake            287.5 ml  Output              500 ml  Net           -212.5 ml   Filed Weights   11/15/15 1502 11/15/15 2104  Weight: 97.5 kg (215 lb) 97.3 kg (214 lb 9.9 oz)    Examination:  General exam: Appears calm and comfortable  Respiratory system: Clear to auscultation. Respiratory effort normal. Cardiovascular system: S1 & S2 heard, RRR. No JVD, murmurs, rubs, gallops or clicks. No pedal edema. Gastrointestinal system: Abdomen is nondistended, soft and nontender. No organomegaly or masses felt. Normal bowel sounds heard. Central nervous system: Alert and oriented. No focal neurological deficits. Extremities: Symmetric 5 x 5 power. Skin: No rashes, lesions or ulcers Psychiatry: Judgement and insight appear normal. Mood & affect appropriate.   Data Reviewed: I have personally reviewed following labs  and imaging studies  CBC:  Recent Labs Lab 11/15/15 1601 11/16/15 0538  WBC 6.4 4.1  NEUTROABS 5.3  --   HGB 12.3* 11.1*  HCT 37.9* 33.9*  MCV 93.8 93.6  PLT 111* [id number]*   Basic Metabolic Panel:  Recent Labs Lab 11/15/15 1601 11/16/15 0538  NA 133* 134*  K 3.7 3.6  CL 102 102  CO2 26 27  GLUCOSE 165* 233*  BUN 23* 21*  CREATININE 1.20 1.00  CALCIUM 9.0 8.7*   GFR: Estimated Creatinine Clearance: 84.5 mL/min (by C-G formula based on SCr of 1 mg/dL). Liver Function Tests:  Recent Labs Lab 11/15/15 1601  AST 14*  ALT 15*  ALKPHOS 41  BILITOT 1.6*  PROT 7.3  ALBUMIN 4.2   CBG:  Recent Labs Lab 11/15/15 2132 11/16/15 0728  GLUCAP 166* 185*    Recent Labs Lab 11/15/15 1622  LATICACIDVEN 1.55    Radiology Studies: Dg Foot Complete Right  Result Date: 11/15/2015 CLINICAL DATA:  Wound infection right foot. EXAM: RIGHT FOOT COMPLETE - 3+  VIEW COMPARISON:  08/23/2014. FINDINGS: Diffuse degenerative change right foot. Degenerative changes are particularly prominent at the first metatarsophalangeal joint and the first distal interphalangeal joint. No evidence of fracture or dislocation. Prior partial amputations of the distal second, third, fourth, and fifth digits. Soft tissue air is noted in the right fourth digit. Soft tissue swelling is also present. Erosive changes noted of the remaining portion of the middle phalanx of the right fourth digit. Osteomyelitis cannot be excluded . IMPRESSION: 1. Soft-tissue swelling soft tissue air right fourth digit. Mild erosive changes of the remaining portion of the middle phalanx of the right fourth digit cannot be excluded. Osteomyelitis cannot be excluded. 2.  Degenerative changes and postsurgical changes as above . Electronically Signed   By: [place]   On: 11/15/2015 16:37    Scheduled Meds: . acetaminophen  1,000 mg Oral QHS  . atorvastatin  40 mg Oral Daily  . clindamycin (CLEOCIN) IV  600 mg Intravenous Q6H  . dabigatran  150 mg Oral Q12H  . diphenhydrAMINE  50 mg Oral QHS  . docusate sodium  100 mg Oral BID  . finasteride  5 mg Oral QHS  . insulin aspart  0-9 Units Subcutaneous TID WC  . levothyroxine  200 mcg Oral QAC breakfast  . metFORMIN  1,000 mg Oral BID WC  . pantoprazole  40 mg Oral Daily  . pregabalin  200 mg Oral TID  . senna  1 tablet Oral BID  . sodium chloride flush  3 mL Intravenous Q12H   Continuous Infusions: . sodium chloride 50 mL/hr at 11/15/15 2215     LOS: 0 days   Carlis Blanchard, MD [facility].   If 7PM-7AM, please contact night-coverage [email or web address] Password [facility] 11/16/2015, 8:37 AM

## 2015-11-16 NOTE — Progress Notes (Signed)
Inpatient Diabetes Program Recommendations  AACE/ADA: New Consensus Statement on Inpatient Glycemic Control (2015)  Target Ranges:  Prepandial:   less than 140 mg/dL      Peak postprandial:   less than 180 mg/dL (1-2 hours)      Critically ill patients:  140 - 180 mg/dL   Review of Glycemic Control Results for Tony [name], Tony [name] (MRN [id number]) as of 11/16/2015 12:32  Ref. Range 11/15/2015 21:32 11/16/2015 07:28 11/16/2015 11:30  Glucose-Capillary Latest Ref Range: 65 - 99 mg/dL 166 (H) 185 (H) 278 (H)   Diabetes history: DM2 Outpatient Diabetes medications: Metformin 1 gm bid Current orders for Inpatient glycemic control: Novolog correction 0-9 units tid  Inpatient Diabetes Program Recommendations:  Please consider A1c to determine prehospital glycemic control and may benefit from basal insulin while in the hospital and Metformin held.  Thank you, Nani Gasser. Karia Ehresman, RN, MSN, CDE Inpatient Glycemic Control Team Team Pager [phone] (8am-5pm) 11/16/2015 12:42 PM

## 2015-11-16 NOTE — Consult Note (Addendum)
[facility] Nurse wound consult note Reason for Consult: Consult requested for right foot. According to the EMR, X-ray is concerning for osteomyelitis, but pt is unable to have an MRI for definitive results. He was admitted with cellulitis to [facility] which he states has improved since admission and IV antibiotics. Consult was performed via remote camera with assistance from the bedside nurse for assessment and measurements. Wound type: Right forth toe with generalized edema and erythremia.  Beginning to blister to outer skin and peel, revealing patchy areas of pink partial thickness skin loss. No odor, scant amt yellow drainage Measurement: 1.5X2 cm affected area to toe,  Also right lower calf with generalized edema and erythremia. Dressing procedure/placement/frequency: Xeroform to promote drying and healing for toe. No topical treatment is indicated at this time for right leg. Discussed plan of care with bedside nurse and patient via phone call. If possible osteomyelitis is a concern and there is no improvement in the wound appearance, please consult ortho service for further plan of care. Please re-consult if further assistance is needed.  Gae Dry MSN, RN, [place], [place], [place].

## 2015-11-16 NOTE — Care Management Obs Status (Signed)
[place] NOTIFICATION   Patient Details  Name: TAVORIS HUCKS MRN: [id number] Date of Birth: 12-20-1945   Medicare Observation Status Notification Given:  Yes    Sherald Barge, RN 11/16/2015, 10:27 AM

## 2015-11-16 NOTE — Care Management Note (Signed)
Case Management Note  Patient Details  Name: Tony [name] MRN: [id number] Date of Birth: 05-10-1945  Subjective/Objective:                  Pt admitted with cellulitis. He is from home, lives with his wife and is ind with ADL's. He uses a scooter to get around and states he is having a new scooter (his has been broken down for 9 months) delivered to his home tomorrow. He states he has to be home for delivery and will be leaving either by DC or AMA. Pt has service dog and quad cane at the bedside. He states he has no needs at home. He is connected with the [facility]. They have been notified of his admission and H&P has been faxed.   Action/Plan: Will cont to follow.  Expected Discharge Date:  11/17/15               Expected Discharge Plan:  Home/Self Care  In-House Referral:  NA  Discharge planning Services  CM Consult  Post Acute Care Choice:  NA Choice offered to:  NA  DME Arranged:    DME Agency:     HH Arranged:    HH Agency:     Status of Service:  In process, will continue to follow  If discussed at Long Length of Stay Meetings, dates discussed:    Additional Comments:  Sherald Barge, RN 11/16/2015, 2:32 PM

## 2015-11-17 DIAGNOSIS — L02611 Cutaneous abscess of right foot: Secondary | ICD-10-CM

## 2015-11-17 DIAGNOSIS — L03031 Cellulitis of right toe: Secondary | ICD-10-CM

## 2015-11-17 LAB — GLUCOSE, CAPILLARY
Glucose-Capillary: 187 mg/dL — ABNORMAL HIGH (ref 65–99)
Glucose-Capillary: 243 mg/dL — ABNORMAL HIGH (ref 65–99)

## 2015-11-17 MED ORDER — CEFACLOR 500 MG PO CAPS: 500.0000 mg | ORAL_CAPSULE | Freq: Three times a day (TID) | ORAL | 0 refills | Status: AC

## 2015-11-17 NOTE — Discharge Summary (Signed)
Physician Discharge Summary  Tony [name] VEH:[id number] DOB: [birth year] DOA: 11/15/2015  PCP: [facility] date: 11/15/2015 Discharge date: 11/17/2015  Admitted From: Home Disposition:  Home  Recommendations for Outpatient Follow-up:  1. Follow up with PCP in 1-2 weeks  Home Health: none. Equipment/Devices: none. Discharge Condition: left AMA.  Stable.  CODE STATUS: Full Code.  Diet recommendation: carb modied.  Brief/Interim Summary: Patient was admitted on Sept 27 by Dr Joesph Fillers for cellulitis, possible osteomyelitis.  As per his H and P:  "  Tony [name]  is a 70 y.o. male, With past medical history relevant for diabetes mellitus with significant peripheral neuropathy of both feet, as well as Atrial fibrillation on anticoagulation Pradaxa who presents to the ED with concerns about right fourth toe swelling, redness and warmth. Patient has no pain sensation due to peripheral neuropathy in the setting of diabetes. He has subjective fever and chills today. According to patient and his wife the right foot looked fine and yesterday, patient noticed an open wound over the right fourth toe today with area of redness that has progressively gotten worse very quickly over the last several hours. The redness and streaking is spreading cephalad direction very quickly. No nausea vomiting or diarrhea. No chest pains palpitations or dizziness and no shortness of breath, no pleuritic symptoms. Patient's wife is at bedside, questions answered, patient has a service dog with him. Despite open wound over right fourth toe area patient has no recollection of any injury, please note that he does not have any sensation or pain on his feet due to severe peripheral neuropathy  In ED- x-rays of right foot shows possible osteomyelitis, unable to do MRI as patient has pacemaker. Patient received vancomycin and cefepime in the ED. ESR was 22 and cultures were obtained  Hospital Course:  Patient was  admitted and started on IV antibiotics.  His history was low in clinical suspicion for osteo, given his symptoms were only started about 2-3 days.  He has been very vigilant about his toes.  Since he had a ppm, he couldn't go thru the MRI machine.  A CT with contrast was done, and it was negative for osteomyelitis.  Slight erosion was supsicious for osteoarthritis.  He was recommended to stay inpatient for more IV antibotics. But he said he had personal affairs needing to be tended to, and thus he signed out against medical advise.  R and B explained including sepsis, loss of limb, including deterioration resulting in death even, and he proceeded with AMA.  I will give him a 5 days of antibiotics, and he will follow up with his PCP at the [place] as soon as possilble.    Discharge Diagnoses:  Principal Problem:   Cellulitis and abscess of Right foot(4th Toe) Active Problems:   Diabetic neuropathy (HCC)   A-fib (HCC)   COPD (chronic obstructive pulmonary disease) (HCC)   Cellulitis and abscess of toe    Discharge Instructions     Medication List    STOP taking these medications   acetaminophen 500 MG tablet Commonly known as:  TYLENOL   docusate sodium 100 MG capsule Commonly known as:  COLACE   metFORMIN 500 MG tablet Commonly known as:  GLUCOPHAGE     TAKE these medications   albuterol 108 (90 Base) MCG/ACT inhaler Commonly known as:  PROVENTIL HFA;VENTOLIN HFA Inhale 2 puffs into the lungs every 6 (six) hours as needed. Shortness of breath   atorvastatin 80 MG tablet  Commonly known as:  LIPITOR Take 40 mg by mouth daily.   cefaclor 500 MG capsule Commonly known as:  CECLOR Take 1 capsule (500 mg total) by mouth 3 (three) times daily.   dabigatran 150 MG Caps capsule Commonly known as:  PRADAXA Take 150 mg by mouth every 12 (twelve) hours.   diphenhydramine-acetaminophen 25-500 MG Tabs tablet Commonly known as:  TYLENOL PM Take 2 tablets by mouth at bedtime.    finasteride 5 MG tablet Commonly known as:  PROSCAR Take 5 mg by mouth at bedtime.   fluticasone 50 MCG/ACT nasal spray Commonly known as:  FLONASE Place 1 spray into both nostrils daily as needed for allergies or rhinitis.   levothyroxine 100 MCG tablet Commonly known as:  SYNTHROID, LEVOTHROID Take 200 mcg by mouth every morning.   mometasone 220 MCG/INH inhaler Commonly known as:  ASMANEX Inhale 2 puffs into the lungs 2 (two) times daily as needed. May use at home medication   omeprazole 20 MG capsule Commonly known as:  PRILOSEC Take 20 mg by mouth at bedtime.   oxyCODONE 5 MG immediate release tablet Commonly known as:  Oxy IR/ROXICODONE Take 5 mg by mouth 3 (three) times daily as needed for moderate pain.   pregabalin 100 MG capsule Commonly known as:  LYRICA Take 200 mg by mouth 3 (three) times daily.   traZODone 50 MG tablet Commonly known as:  DESYREL Take 50 mg by mouth at bedtime as needed for sleep.       No Known Allergies  Consultations:  None.    Procedures/Studies: Ct Foot Right W Contrast  Result Date: 11/16/2015 CLINICAL DATA:  Status post wound to the right second toe, with fever generalized weakness. Initial encounter. EXAM: CT OF THE RIGHT FOOT WITH CONTRAST TECHNIQUE: Multidetector CT imaging was performed following the standard protocol during bolus administration of intravenous contrast. CONTRAST:  62m ISOVUE-300 IOPAMIDOL (ISOVUE-300) INJECTION 61% COMPARISON:  Right foot radiographs performed 11/15/2015 FINDINGS: There is a no evidence of acute fracture or dislocation. There is mild chronic deformity involving distal second phalanx. The known soft tissue wound at the second toe is not well characterized on CT. Prominent osseous erosions are noted at the first interphalangeal joint and the fourth distal interphalangeal joint, and more mild erosive change is seen at the distal first metatarsal and underlying medial sesamoid. 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 Minimal erosive change is noted at the midfoot. The subtalar joint is grossly unremarkable in appearance. The ankle mortise is grossly intact. Mild diffuse dorsal and lateral soft tissue swelling is noted, with small soft tissue defects noted along the lateral aspect of the ankle. Visualized flexor and extensor tendons are grossly unremarkable. The Achilles tendon remains intact. The peroneal tendons are grossly unremarkable in appearance. No abnormal focal contrast enhancement is seen. There is no evidence of abscess at this time. The visualized vasculature is grossly unremarkable, aside from some mildly tortuous vessels along the plantar aspect of the foot. IMPRESSION: 1. No evidence of fracture or dislocation. 2. Known soft tissue wound at the second toe is not well characterized on CT. 3. Chronic osseous erosions at the first interphalangeal joint and fourth distal interphalangeal joint, and more mild erosive change at the distal first metatarsal and underlying medial sesamoid. 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 Minimal erosive change at the midfoot. 4. Mild diffuse dorsal and lateral soft tissue swelling, with small soft tissue defect along the lateral aspect of the ankle. Electronically Signed   By: JJacqulynn Cadet  Chang M.D.   On: 11/16/2015 21:59   Dg Foot Complete Right  Result Date: 11/15/2015 CLINICAL DATA:  Wound infection right foot. EXAM: RIGHT FOOT COMPLETE - 3+ VIEW COMPARISON:  08/23/2014. FINDINGS: Diffuse degenerative change right foot. Degenerative changes are particularly prominent at the first metatarsophalangeal joint and the first distal interphalangeal joint. No evidence of fracture or dislocation. Prior partial amputations of the distal second, third, fourth, and fifth digits. Soft tissue air is noted in the right fourth digit. Soft tissue swelling is also present. Erosive changes noted of the remaining portion of the middle phalanx of the right fourth digit.  Osteomyelitis cannot be excluded . IMPRESSION: 1. Soft-tissue swelling soft tissue air right fourth digit. Mild erosive changes of the remaining portion of the middle phalanx of the right fourth digit cannot be excluded. Osteomyelitis cannot be excluded. 2.  Degenerative changes and postsurgical changes as above . Electronically Signed   By: Marcello Moores  Register   On: 11/15/2015 16:37       Subjective: Feeling well.    Discharge Exam: Vitals:   11/16/15 2016 11/17/15 0700  BP: (!) 109/46 (!) 91/47  Pulse: 62 60  Resp: 19 18  Temp: 98.7 F (37.1 C) 98.1 F (36.7 C)   Vitals:   11/16/15 1333 11/16/15 2016 11/17/15 0700 11/17/15 0816  BP: (!) 105/59 (!) 109/46 (!) 91/47   Pulse: 62 62 60   Resp: 20 19 18    Temp: 98.7 F (37.1 C) 98.7 F (37.1 C) 98.1 F (36.7 C)   TempSrc: Oral Oral Oral   SpO2: 100% 98% 100% 95%  Weight:      Height:        General: Pt is alert, awake, not in acute distress Cardiovascular: RRR, S1/S2 +, no rubs, no gallops Respiratory: CTA bilaterally, no wheezing, no rhonchi Abdominal: Soft, NT, ND, bowel sounds + Extremities: no edema, no cyanosis    The results of significant diagnostics from this hospitalization (including imaging, microbiology, ancillary and laboratory) are listed below for reference.     Microbiology: Recent Results (from the past 240 hour(s))  Blood culture (routine x 2)     Status: None (Preliminary result)   Collection Time: 11/15/15  4:01 PM  Result Value Ref Range Status   Specimen Description BLOOD RIGHT ARM  Final   Special Requests BOTTLES DRAWN AEROBIC AND ANAEROBIC [place]  Final   Culture NO GROWTH < 24 HOURS  Final   Report Status PENDING  Incomplete  Blood culture (routine x 2)     Status: None (Preliminary result)   Collection Time: 11/15/15  4:15 PM  Result Value Ref Range Status   Specimen Description BLOOD LEFT ARM  Final   Special Requests   Final    BOTTLES DRAWN AEROBIC AND ANAEROBIC AEB=10CC ANA=8CC    Culture NO GROWTH < 24 HOURS  Final   Report Status PENDING  Incomplete     Basic Metabolic Panel:  Recent Labs Lab 11/15/15 1601 11/16/15 0538  NA 133* 134*  K 3.7 3.6  CL 102 102  CO2 26 27  GLUCOSE 165* 233*  BUN 23* 21*  CREATININE 1.20 1.00  CALCIUM 9.0 8.7*   Liver Function Tests:  Recent Labs Lab 11/15/15 1601  AST 14*  ALT 15*  ALKPHOS 41  BILITOT 1.6*  PROT 7.3  ALBUMIN 4.2   CBC:  Recent Labs Lab 11/15/15 1601 11/16/15 0538  WBC 6.4 4.1  NEUTROABS 5.3  --   HGB 12.3* 11.1*  HCT  37.9* 33.9*  MCV 93.8 93.6  PLT 111* 102*   CBG:  Recent Labs Lab 11/16/15 0728 11/16/15 1130 11/16/15 1655 11/16/15 2139 11/17/15 0724  GLUCAP 185* 278* 189* 195* 187*    Urinalysis    Component Value Date/Time   COLORURINE YELLOW 11/15/2015 1845   APPEARANCEUR CLEAR 11/15/2015 1845   LABSPEC 1.020 11/15/2015 1845   PHURINE 5.5 11/15/2015 1845   GLUCOSEU NEGATIVE 11/15/2015 1845   HGBUR TRACE (A) 11/15/2015 1845   BILIRUBINUR NEGATIVE 11/15/2015 1845   KETONESUR NEGATIVE 11/15/2015 1845   PROTEINUR NEGATIVE 11/15/2015 1845   UROBILINOGEN 0.2 05/28/2014 1850   NITRITE NEGATIVE 11/15/2015 1845   LEUKOCYTESUR NEGATIVE 11/15/2015 1845   Sepsis Labs Invalid input(s): PROCALCITONIN,  WBC,  LACTICIDVEN Microbiology Recent Results (from the past 240 hour(s))  Blood culture (routine x 2)     Status: None (Preliminary result)   Collection Time: 11/15/15  4:01 PM  Result Value Ref Range Status   Specimen Description BLOOD RIGHT ARM  Final   Special Requests BOTTLES DRAWN AEROBIC AND ANAEROBIC [place]  Final   Culture NO GROWTH < 24 HOURS  Final   Report Status PENDING  Incomplete  Blood culture (routine x 2)     Status: None (Preliminary result)   Collection Time: 11/15/15  4:15 PM  Result Value Ref Range Status   Specimen Description BLOOD LEFT ARM  Final   Special Requests   Final    BOTTLES DRAWN AEROBIC AND ANAEROBIC AEB=10CC ANA=8CC   Culture NO GROWTH <  24 HOURS  Final   Report Status PENDING  Incomplete     Time coordinating discharge: Over 30 minutes  SIGNED:  Orvan Falconer, MD FACP [facility] 11/17/2015, 10:20 AM   If 7PM-7AM, please contact night-coverage [email or web address] Password [email or web address]

## 2015-11-17 NOTE — Care Management Note (Signed)
Case Management Note  Patient Details  Name: Tony [name] MRN: [id number] Date of Birth: March 07, 1945   Expected Discharge Date:  11/17/15               Expected Discharge Plan:  Against Medical Advice  In-House Referral:  NA  Discharge planning Services  CM Consult  Post Acute Care Choice:  NA Choice offered to:  NA  DME Arranged:    DME Agency:     HH Arranged:    [place] Agency:     Status of Service:  Completed, signed off  If discussed at [employer] of Stay Meetings, dates discussed:    Additional Comments: Pt left hospital AMA. [facility]. DC summary faxed to [facility].   Sherald Barge, RN 11/17/2015, 12:26 PM

## 2015-11-20 LAB — CULTURE, BLOOD (ROUTINE X 2)
CULTURE: NO GROWTH
CULTURE: NO GROWTH

## 2016-03-08 ENCOUNTER — Inpatient Hospital Stay (HOSPITAL_COMMUNITY)
Admission: EM | Admit: 2016-03-08 | Discharge: 2016-03-16 | DRG: 871 | Disposition: A | Discharge status: Home Health | Payer: Medicare PPO | Attending: Internal Medicine | Admitting: Internal Medicine

## 2016-03-08 ENCOUNTER — Emergency Department (HOSPITAL_COMMUNITY): Payer: Medicare PPO

## 2016-03-08 ENCOUNTER — Encounter (HOSPITAL_COMMUNITY): Payer: Self-pay | Admitting: Emergency Medicine

## 2016-03-08 DIAGNOSIS — E872 Acidosis: Secondary | ICD-10-CM | POA: Diagnosis not present

## 2016-03-08 DIAGNOSIS — R945 Abnormal results of liver function studies: Secondary | ICD-10-CM

## 2016-03-08 DIAGNOSIS — R17 Unspecified jaundice: Secondary | ICD-10-CM

## 2016-03-08 DIAGNOSIS — R918 Other nonspecific abnormal finding of lung field: Secondary | ICD-10-CM

## 2016-03-08 DIAGNOSIS — Z7189 Other specified counseling: Secondary | ICD-10-CM | POA: Diagnosis not present

## 2016-03-08 DIAGNOSIS — R0789 Other chest pain: Secondary | ICD-10-CM | POA: Diagnosis present

## 2016-03-08 DIAGNOSIS — A419 Sepsis, unspecified organism: Secondary | ICD-10-CM | POA: Diagnosis present

## 2016-03-08 DIAGNOSIS — J440 Chronic obstructive pulmonary disease with acute lower respiratory infection: Secondary | ICD-10-CM | POA: Diagnosis present

## 2016-03-08 DIAGNOSIS — I482 Chronic atrial fibrillation, unspecified: Secondary | ICD-10-CM | POA: Diagnosis present

## 2016-03-08 DIAGNOSIS — Z794 Long term (current) use of insulin: Secondary | ICD-10-CM

## 2016-03-08 DIAGNOSIS — E669 Obesity, unspecified: Secondary | ICD-10-CM | POA: Diagnosis present

## 2016-03-08 DIAGNOSIS — D630 Anemia in neoplastic disease: Secondary | ICD-10-CM | POA: Diagnosis present

## 2016-03-08 DIAGNOSIS — C259 Malignant neoplasm of pancreas, unspecified: Secondary | ICD-10-CM | POA: Diagnosis present

## 2016-03-08 DIAGNOSIS — E639 Nutritional deficiency, unspecified: Secondary | ICD-10-CM | POA: Diagnosis present

## 2016-03-08 DIAGNOSIS — K649 Unspecified hemorrhoids: Secondary | ICD-10-CM | POA: Diagnosis present

## 2016-03-08 DIAGNOSIS — Z950 Presence of cardiac pacemaker: Secondary | ICD-10-CM

## 2016-03-08 DIAGNOSIS — E119 Type 2 diabetes mellitus without complications: Secondary | ICD-10-CM

## 2016-03-08 DIAGNOSIS — R791 Abnormal coagulation profile: Secondary | ICD-10-CM | POA: Diagnosis present

## 2016-03-08 DIAGNOSIS — M4800 Spinal stenosis, site unspecified: Secondary | ICD-10-CM | POA: Diagnosis present

## 2016-03-08 DIAGNOSIS — K8689 Other specified diseases of pancreas: Secondary | ICD-10-CM

## 2016-03-08 DIAGNOSIS — K869 Disease of pancreas, unspecified: Secondary | ICD-10-CM | POA: Diagnosis present

## 2016-03-08 DIAGNOSIS — K7200 Acute and subacute hepatic failure without coma: Secondary | ICD-10-CM | POA: Diagnosis not present

## 2016-03-08 DIAGNOSIS — E875 Hyperkalemia: Secondary | ICD-10-CM | POA: Diagnosis present

## 2016-03-08 DIAGNOSIS — R778 Other specified abnormalities of plasma proteins: Secondary | ICD-10-CM

## 2016-03-08 DIAGNOSIS — C7800 Secondary malignant neoplasm of unspecified lung: Secondary | ICD-10-CM | POA: Diagnosis present

## 2016-03-08 DIAGNOSIS — D649 Anemia, unspecified: Secondary | ICD-10-CM

## 2016-03-08 DIAGNOSIS — Z7901 Long term (current) use of anticoagulants: Secondary | ICD-10-CM

## 2016-03-08 DIAGNOSIS — L089 Local infection of the skin and subcutaneous tissue, unspecified: Secondary | ICD-10-CM | POA: Diagnosis present

## 2016-03-08 DIAGNOSIS — G9340 Encephalopathy, unspecified: Secondary | ICD-10-CM | POA: Diagnosis not present

## 2016-03-08 DIAGNOSIS — Z8051 Family history of malignant neoplasm of kidney: Secondary | ICD-10-CM

## 2016-03-08 DIAGNOSIS — E1140 Type 2 diabetes mellitus with diabetic neuropathy, unspecified: Secondary | ICD-10-CM | POA: Diagnosis present

## 2016-03-08 DIAGNOSIS — R4182 Altered mental status, unspecified: Secondary | ICD-10-CM | POA: Diagnosis not present

## 2016-03-08 DIAGNOSIS — R160 Hepatomegaly, not elsewhere classified: Secondary | ICD-10-CM | POA: Diagnosis not present

## 2016-03-08 DIAGNOSIS — Z79899 Other long term (current) drug therapy: Secondary | ICD-10-CM

## 2016-03-08 DIAGNOSIS — K831 Obstruction of bile duct: Secondary | ICD-10-CM | POA: Diagnosis present

## 2016-03-08 DIAGNOSIS — Z515 Encounter for palliative care: Secondary | ICD-10-CM | POA: Diagnosis not present

## 2016-03-08 DIAGNOSIS — C787 Secondary malignant neoplasm of liver and intrahepatic bile duct: Secondary | ICD-10-CM | POA: Diagnosis present

## 2016-03-08 DIAGNOSIS — J441 Chronic obstructive pulmonary disease with (acute) exacerbation: Secondary | ICD-10-CM

## 2016-03-08 DIAGNOSIS — R748 Abnormal levels of other serum enzymes: Secondary | ICD-10-CM

## 2016-03-08 DIAGNOSIS — J189 Pneumonia, unspecified organism: Secondary | ICD-10-CM | POA: Diagnosis not present

## 2016-03-08 DIAGNOSIS — Z9049 Acquired absence of other specified parts of digestive tract: Secondary | ICD-10-CM

## 2016-03-08 DIAGNOSIS — R410 Disorientation, unspecified: Secondary | ICD-10-CM

## 2016-03-08 DIAGNOSIS — R7989 Other specified abnormal findings of blood chemistry: Secondary | ICD-10-CM | POA: Diagnosis not present

## 2016-03-08 DIAGNOSIS — Z8042 Family history of malignant neoplasm of prostate: Secondary | ICD-10-CM

## 2016-03-08 DIAGNOSIS — F4310 Post-traumatic stress disorder, unspecified: Secondary | ICD-10-CM | POA: Diagnosis present

## 2016-03-08 DIAGNOSIS — J181 Lobar pneumonia, unspecified organism: Secondary | ICD-10-CM | POA: Diagnosis not present

## 2016-03-08 DIAGNOSIS — Z89421 Acquired absence of other right toe(s): Secondary | ICD-10-CM

## 2016-03-08 DIAGNOSIS — J9601 Acute respiratory failure with hypoxia: Secondary | ICD-10-CM | POA: Diagnosis not present

## 2016-03-08 DIAGNOSIS — Z6828 Body mass index (BMI) 28.0-28.9, adult: Secondary | ICD-10-CM

## 2016-03-08 DIAGNOSIS — C801 Malignant (primary) neoplasm, unspecified: Secondary | ICD-10-CM

## 2016-03-08 DIAGNOSIS — M6281 Muscle weakness (generalized): Secondary | ICD-10-CM

## 2016-03-08 HISTORY — DX: Other specified diseases of pancreas: K86.89

## 2016-03-08 LAB — CBC
HCT: 27.3 % — ABNORMAL LOW (ref 39.0–52.0)
HEMOGLOBIN: 8.6 g/dL — AB (ref 13.0–17.0)
MCH: 31.4 pg (ref 26.0–34.0)
MCHC: 31.5 g/dL (ref 30.0–36.0)
MCV: 99.6 fL (ref 78.0–100.0)
PLATELETS: 167 10*3/uL (ref 150–400)
RBC: 2.74 MIL/uL — AB (ref 4.22–5.81)
RDW: 26.2 % — ABNORMAL HIGH (ref 11.5–15.5)
WBC: 7.5 10*3/uL (ref 4.0–10.5)

## 2016-03-08 LAB — URINALYSIS, ROUTINE W REFLEX MICROSCOPIC
Glucose, UA: NEGATIVE mg/dL
Leukocytes, UA: NEGATIVE
NITRITE: NEGATIVE
PH: 5.5 (ref 5.0–8.0)
Protein, ur: 30 mg/dL — AB
Specific Gravity, Urine: 1.02 (ref 1.005–1.030)

## 2016-03-08 LAB — URINALYSIS, MICROSCOPIC (REFLEX)

## 2016-03-08 LAB — HEPATIC FUNCTION PANEL
ALT: 76 U/L — AB (ref 17–63)
AST: 177 U/L — ABNORMAL HIGH (ref 15–41)
Albumin: 3.1 g/dL — ABNORMAL LOW (ref 3.5–5.0)
Alkaline Phosphatase: 328 U/L — ABNORMAL HIGH (ref 38–126)
BILIRUBIN DIRECT: 2.8 mg/dL — AB (ref 0.1–0.5)
BILIRUBIN INDIRECT: 2.3 mg/dL — AB (ref 0.3–0.9)
Total Bilirubin: 5.1 mg/dL — ABNORMAL HIGH (ref 0.3–1.2)
Total Protein: 6.4 g/dL — ABNORMAL LOW (ref 6.5–8.1)

## 2016-03-08 LAB — COMPREHENSIVE METABOLIC PANEL
ALT: 75 U/L — ABNORMAL HIGH (ref 17–63)
ANION GAP: 12 (ref 5–15)
AST: 159 U/L — ABNORMAL HIGH (ref 15–41)
Albumin: 3.3 g/dL — ABNORMAL LOW (ref 3.5–5.0)
Alkaline Phosphatase: 355 U/L — ABNORMAL HIGH (ref 38–126)
BILIRUBIN TOTAL: 6 mg/dL — AB (ref 0.3–1.2)
BUN: 31 mg/dL — ABNORMAL HIGH (ref 6–20)
CALCIUM: 9 mg/dL (ref 8.9–10.3)
CO2: 22 mmol/L (ref 22–32)
Chloride: 101 mmol/L (ref 101–111)
Creatinine, Ser: 1.14 mg/dL (ref 0.61–1.24)
GFR calc Af Amer: >60 mL/min (ref >60)
Glucose, Bld: 160 mg/dL — ABNORMAL HIGH (ref 65–99)
POTASSIUM: 5.5 mmol/L — AB (ref 3.5–5.1)
Sodium: 135 mmol/L (ref 135–145)
Total Protein: 6.9 g/dL (ref 6.5–8.1)

## 2016-03-08 LAB — TYPE AND SCREEN
ABO/RH(D): A POS
ANTIBODY SCREEN: NEGATIVE

## 2016-03-08 LAB — INFLUENZA PANEL BY PCR (TYPE A & B)
INFLAPCR: NEGATIVE
INFLBPCR: NEGATIVE

## 2016-03-08 LAB — STREP PNEUMONIAE URINARY ANTIGEN: STREP PNEUMO URINARY ANTIGEN: NEGATIVE

## 2016-03-08 LAB — I-STAT CG4 LACTIC ACID, ED: LACTIC ACID, VENOUS: 3.49 mmol/L — AB (ref 0.5–1.9)

## 2016-03-08 LAB — GLUCOSE, CAPILLARY
GLUCOSE-CAPILLARY: 178 mg/dL — AB (ref 65–99)
Glucose-Capillary: 400 mg/dL — ABNORMAL HIGH (ref 65–99)

## 2016-03-08 LAB — LIPASE, BLOOD: LIPASE: 29 U/L (ref 11–51)

## 2016-03-08 LAB — TROPONIN I
TROPONIN I: 0.03 ng/mL — AB (ref <0.03)
TROPONIN I: 0.03 ng/mL — AB (ref <0.03)

## 2016-03-08 LAB — BRAIN NATRIURETIC PEPTIDE: B Natriuretic Peptide: 272 pg/mL — ABNORMAL HIGH (ref 0.0–100.0)

## 2016-03-08 LAB — POC OCCULT BLOOD, ED: Fecal Occult Bld: NEGATIVE

## 2016-03-08 MED ORDER — SODIUM CHLORIDE 0.9 % IV BOLUS (SEPSIS)
1000.0000 mL | Freq: Once | INTRAVENOUS | Status: AC
  Administered 2016-03-08: 1000 mL via INTRAVENOUS

## 2016-03-08 MED ORDER — DEXTROSE 5 % IV SOLN
1.0000 g | Freq: Once | INTRAVENOUS | Status: AC
  Administered 2016-03-08: 1 g via INTRAVENOUS
  Filled 2016-03-08: qty 10

## 2016-03-08 MED ORDER — ALBUTEROL (5 MG/ML) CONTINUOUS INHALATION SOLN
10.0000 mg/h | INHALATION_SOLUTION | Freq: Once | RESPIRATORY_TRACT | Status: AC
  Administered 2016-03-08: 10 mg/h via RESPIRATORY_TRACT
  Filled 2016-03-08: qty 20

## 2016-03-08 MED ORDER — ENSURE ENLIVE PO LIQD
237.0000 mL | Freq: Two times a day (BID) | ORAL | Status: DC
  Administered 2016-03-09: 237 mL via ORAL

## 2016-03-08 MED ORDER — PREGABALIN 75 MG PO CAPS
200.0000 mg | ORAL_CAPSULE | Freq: Two times a day (BID) | ORAL | Status: DC
  Administered 2016-03-08 – 2016-03-16 (×16): 200 mg via ORAL
  Filled 2016-03-08 (×16): qty 1

## 2016-03-08 MED ORDER — SODIUM CHLORIDE 0.9% FLUSH
3.0000 mL | Freq: Two times a day (BID) | INTRAVENOUS | Status: DC
  Administered 2016-03-08 – 2016-03-15 (×11): 3 mL via INTRAVENOUS

## 2016-03-08 MED ORDER — LACTULOSE 10 GM/15ML PO SOLN: 10.0000 g | Freq: Every day | ORAL | Status: DC | PRN

## 2016-03-08 MED ORDER — LEVOTHYROXINE SODIUM 100 MCG PO TABS
200.0000 ug | ORAL_TABLET | Freq: Every morning | ORAL | Status: DC
  Administered 2016-03-09 – 2016-03-16 (×8): 200 ug via ORAL
  Filled 2016-03-08 (×8): qty 2

## 2016-03-08 MED ORDER — AZITHROMYCIN 500 MG IV SOLR
500.0000 mg | Freq: Once | INTRAVENOUS | Status: AC
  Administered 2016-03-08: 500 mg via INTRAVENOUS
  Filled 2016-03-08: qty 500

## 2016-03-08 MED ORDER — SODIUM CHLORIDE 0.9 % IV SOLN: 250.0000 mL | INTRAVENOUS | Status: DC | PRN

## 2016-03-08 MED ORDER — AZITHROMYCIN 250 MG PO TABS
500.0000 mg | ORAL_TABLET | ORAL | Status: AC
  Administered 2016-03-09 – 2016-03-15 (×7): 500 mg via ORAL
  Filled 2016-03-08 (×7): qty 2

## 2016-03-08 MED ORDER — DEXTROSE 5 % IV SOLN
1.0000 g | INTRAVENOUS | Status: DC
  Administered 2016-03-09 – 2016-03-10 (×2): 1 g via INTRAVENOUS
  Filled 2016-03-08 (×3): qty 10

## 2016-03-08 MED ORDER — OXYCODONE HCL 5 MG PO TABS
5.0000 mg | ORAL_TABLET | Freq: Three times a day (TID) | ORAL | Status: DC | PRN
  Administered 2016-03-15: 5 mg via ORAL
  Filled 2016-03-08: qty 1

## 2016-03-08 MED ORDER — SODIUM CHLORIDE 0.9% FLUSH
3.0000 mL | INTRAVENOUS | Status: DC | PRN
Start: 2016-03-08 — End: 2016-03-16

## 2016-03-08 MED ORDER — TRAZODONE HCL 50 MG PO TABS
50.0000 mg | ORAL_TABLET | Freq: Every evening | ORAL | Status: DC | PRN
  Administered 2016-03-09 – 2016-03-14 (×3): 50 mg via ORAL
  Filled 2016-03-08 (×4): qty 1

## 2016-03-08 MED ORDER — INSULIN ASPART 100 UNIT/ML ~~LOC~~ SOLN
0.0000 [IU] | Freq: Three times a day (TID) | SUBCUTANEOUS | Status: DC
  Administered 2016-03-08 – 2016-03-09 (×2): 2 [IU] via SUBCUTANEOUS
  Administered 2016-03-09: 3 [IU] via SUBCUTANEOUS
  Administered 2016-03-09 – 2016-03-13 (×8): 2 [IU] via SUBCUTANEOUS
  Administered 2016-03-13 – 2016-03-14 (×3): 1 [IU] via SUBCUTANEOUS
  Administered 2016-03-15 (×3): 2 [IU] via SUBCUTANEOUS

## 2016-03-08 MED ORDER — SODIUM CHLORIDE 0.9% FLUSH
3.0000 mL | Freq: Two times a day (BID) | INTRAVENOUS | Status: DC
  Administered 2016-03-08 – 2016-03-15 (×12): 3 mL via INTRAVENOUS

## 2016-03-08 MED ORDER — ALBUTEROL SULFATE (2.5 MG/3ML) 0.083% IN NEBU
5.0000 mg | INHALATION_SOLUTION | Freq: Once | RESPIRATORY_TRACT | Status: AC
  Administered 2016-03-08: 5 mg via RESPIRATORY_TRACT
  Filled 2016-03-08: qty 6

## 2016-03-08 MED ORDER — PANTOPRAZOLE SODIUM 40 MG PO TBEC
40.0000 mg | DELAYED_RELEASE_TABLET | Freq: Every day | ORAL | Status: DC
  Administered 2016-03-08 – 2016-03-16 (×9): 40 mg via ORAL
  Filled 2016-03-08 (×9): qty 1

## 2016-03-08 MED ORDER — METHYLPREDNISOLONE SODIUM SUCC 125 MG IJ SOLR
125.0000 mg | Freq: Once | INTRAMUSCULAR | Status: AC
  Administered 2016-03-08: 125 mg via INTRAVENOUS
  Filled 2016-03-08: qty 2

## 2016-03-08 MED ORDER — INSULIN GLARGINE 100 UNIT/ML ~~LOC~~ SOLN
10.0000 [IU] | Freq: Every day | SUBCUTANEOUS | Status: DC
  Administered 2016-03-08: 10 [IU] via SUBCUTANEOUS
  Filled 2016-03-08 (×3): qty 0.1

## 2016-03-08 MED ORDER — NORTRIPTYLINE HCL 10 MG PO CAPS
10.0000 mg | ORAL_CAPSULE | Freq: Every day | ORAL | Status: DC
  Administered 2016-03-08 – 2016-03-15 (×8): 10 mg via ORAL
  Filled 2016-03-08 (×10): qty 1

## 2016-03-08 MED ORDER — ENSURE ENLIVE PO LIQD: 237.0000 mL | Freq: Two times a day (BID) | ORAL | Status: DC

## 2016-03-08 MED ORDER — DABIGATRAN ETEXILATE MESYLATE 75 MG PO CAPS
150.0000 mg | ORAL_CAPSULE | Freq: Two times a day (BID) | ORAL | Status: DC
  Administered 2016-03-09 – 2016-03-10 (×3): 150 mg via ORAL
  Filled 2016-03-08 (×2): qty 2

## 2016-03-08 MED ORDER — FINASTERIDE 5 MG PO TABS
5.0000 mg | ORAL_TABLET | Freq: Every day | ORAL | Status: DC
  Administered 2016-03-08 – 2016-03-15 (×8): 5 mg via ORAL
  Filled 2016-03-08 (×10): qty 1

## 2016-03-08 NOTE — ED Provider Notes (Signed)
[facility] DEPT Provider Note   CSN: [id number] Arrival date & time: 03/08/16  1040     History   Chief Complaint Chief Complaint  Patient presents with  . Shortness of Breath  . Cough    HPI Tony [name] is a 71 y.o. male.  Pt presents to the ED with SOB and cough.  The pt said his sx started about 2 days ago.  He has diffuse weakness.  He denies n/v.  He has had chills.  He has a hx of a.fib and is on pradaxa (CHADVASC 3).  Pt has had subjective fevers.  His wife also reports that his urine has been dark.  He has been on abx (?name) for a toe infection.      Past Medical History:  Diagnosis Date  . Atrial fibrillation ([place])   . COPD (chronic obstructive pulmonary disease) ([place])   . Diabetes mellitus   . Neuropathy ([place])   . Pacemaker   . PTSD (post-traumatic stress disorder)   . Spinal stenosis     Patient Active Problem List   Diagnosis Date Noted  . CAP (community acquired pneumonia) 03/08/2016  . Cellulitis and abscess of Right foot(4th Toe) 11/15/2015  . Cellulitis and abscess of toe 11/15/2015  . Cellulitis of right lower extremity 08/23/2014  . Venous stasis ulcer of left leg 05/12/2012  . 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 Supratherapeutic INR 04/03/2012  . 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 Cellulitis of left lower extremity 04/02/2012  . Diabetic neuropathy ([place]) 01/13/2011  . A-fib ([place]) 01/13/2011  . Gout 01/13/2011  . BPH (benign prostatic hyperplasia) 01/13/2011  . Hypothyroidism 01/13/2011  . Diabetes type 2, controlled ([place]) 01/13/2011  . COPD (chronic obstructive pulmonary disease) ([place]) 01/13/2011    Past Surgical History:  Procedure Laterality Date  . CHOLECYSTECTOMY         Home Medications    Prior to Admission medications   Medication Sig Start Date End Date Taking? Authorizing Provider  acetaminophen (TYLENOL) 325 MG tablet Take 975 mg by mouth 2 (two) times daily.   Yes Historical Provider, MD   albuterol (PROVENTIL HFA;VENTOLIN HFA) 108 (90 BASE) MCG/ACT inhaler Inhale 2 puffs into the lungs every 6 (six) hours as needed. Shortness of breath    Yes Historical Provider, MD  atorvastatin (LIPITOR) 80 MG tablet Take 80 mg by mouth daily.    Yes Historical Provider, MD  dabigatran (PRADAXA) 150 MG CAPS capsule Take 150 mg by mouth every 12 (twelve) hours.   Yes Historical Provider, MD  diphenhydramine-acetaminophen (TYLENOL PM) 25-500 MG TABS Take 2 tablets by mouth at bedtime.   Yes Historical Provider, MD  finasteride (PROSCAR) 5 MG tablet Take 5 mg by mouth at bedtime.    Yes Historical Provider, MD  fluticasone (FLONASE) 50 MCG/ACT nasal spray Place 1 spray into both nostrils daily as needed for allergies or rhinitis.   Yes Historical Provider, MD  glipiZIDE (GLUCOTROL) 10 MG tablet Take 20 mg by mouth daily as needed (if BG >150).   Yes Historical Provider, MD  insulin glargine (LANTUS) 100 UNIT/ML injection Inject 15 Units into the skin as needed. If BG > 200, inject 15 units If BG > 225, inject 20 units   Yes Historical Provider, MD  lactulose (CHRONULAC) 10 GM/15ML solution Take 10 g by mouth daily as needed for mild constipation.   Yes Historical Provider, MD  levothyroxine (SYNTHROID, LEVOTHROID) 100 MCG tablet Take 200 mcg by mouth every morning.  Yes Historical Provider, MD  mometasone [facility]) 220 MCG/INH inhaler Inhale 2 puffs into the lungs 2 (two) times daily. May use at home medication   Yes Historical Provider, MD  nortriptyline (PAMELOR) 10 MG capsule Take 10 mg by mouth at bedtime.   Yes Historical Provider, MD  omeprazole (PRILOSEC) 20 MG capsule Take 20 mg by mouth at bedtime.    Yes Historical Provider, MD  oxyCODONE (OXY IR/ROXICODONE) 5 MG immediate release tablet Take 5 mg by mouth 3 (three) times daily as needed for moderate pain.    Yes Historical Provider, MD  pregabalin (LYRICA) 100 MG capsule Take 200 mg by mouth 2 (two) times daily.    Yes Historical  Provider, MD  traZODone (DESYREL) 50 MG tablet Take 50 mg by mouth at bedtime as needed for sleep.    Yes Historical Provider, MD    Family History Family History  Problem Relation Age of Onset  . Heart disease Mother   . Prostate cancer Brother   . Kidney cancer Brother     Social History Social History  Substance Use Topics  . Smoking status: Never Smoker  . Smokeless tobacco: Never Used  . Alcohol use No     Allergies   Patient has no known allergies.   Review of Systems Review of Systems  Constitutional: Positive for chills and fever.  Respiratory: Positive for cough, shortness of breath and wheezing.   All other systems reviewed and are negative.    Physical Exam Updated Vital Signs BP 115/61   Pulse 62   Temp 97.6 F (36.4 C) (Oral)   Resp 17   Ht 6' (1.829 m)   Wt 198 lb (89.8 kg)   SpO2 98%   BMI 26.85 kg/m   Physical Exam  Constitutional: He is oriented to person, place, and time. He appears well-developed and well-nourished.  HENT:  Head: Normocephalic and atraumatic.  Right Ear: External ear normal.  Left Ear: External ear normal.  Nose: Nose normal.  Mouth/Throat: Oropharynx is clear and moist.  Eyes: Conjunctivae and EOM are normal. Pupils are equal, round, and reactive to light.  Neck: Normal range of motion. Neck supple.  Cardiovascular: Normal rate, regular rhythm, normal heart sounds and intact distal pulses.   Pulmonary/Chest: Effort normal. He has wheezes.  Abdominal: Soft. Bowel sounds are normal.  Genitourinary: Rectum normal and penis normal. Rectal exam shows guaiac negative stool.  Musculoskeletal: Normal range of motion.  Neurological: He is alert and oriented to person, place, and time.  Skin: Skin is warm. There is pallor.  Jaundiced   Psychiatric: He has a normal mood and affect. His behavior is normal. Judgment and thought content normal.  Nursing note and vitals reviewed.    ED Treatments / Results  Labs (all labs  ordered are listed, but only abnormal results are displayed) Labs Reviewed  CBC - Abnormal; Notable for the following:       Result Value   RBC 2.74 (*)    Hemoglobin 8.6 (*)    HCT 27.3 (*)    RDW 26.2 (*)    All other components within normal limits  COMPREHENSIVE METABOLIC PANEL - Abnormal; Notable for the following:    Potassium 5.5 (*)    Glucose, Bld 160 (*)    BUN 31 (*)    Albumin 3.3 (*)    AST 159 (*)    ALT 75 (*)    Alkaline Phosphatase 355 (*)    Total Bilirubin 6.0 (*)  All other components within normal limits  TROPONIN I - Abnormal; Notable for the following:    Troponin I 0.03 (*)    All other components within normal limits  BRAIN NATRIURETIC PEPTIDE - Abnormal; Notable for the following:    B Natriuretic Peptide 272.0 (*)    All other components within normal limits  I-STAT CG4 LACTIC ACID, ED - Abnormal; Notable for the following:    Lactic Acid, Venous 3.49 (*)    All other components within normal limits  LIPASE, BLOOD  URINALYSIS, ROUTINE W REFLEX MICROSCOPIC  POC OCCULT BLOOD, ED  TYPE AND SCREEN    EKG  EKG Interpretation  Date/Time:  Friday March 08 2016 11:09:39 EST Ventricular Rate:  61 PR Interval:    QRS Duration: 100 QT Interval:  440 QTC Calculation: 442 R Axis:   73 Text Interpretation:  Atrial-paced rhythm with prolonged AV conduction ST depression, consider subendocardial injury Abnormal ECG No significant change since last tracing Confirmed by [facility] MD, Sukari Grist ([id number]) on 03/08/2016 12:38:55 PM       Radiology Dg Chest 2 View  Result Date: 03/08/2016 CLINICAL DATA:  71 year old male with dyspnea on exertion. Cough and congestion for the past 2-3 days. EXAM: CHEST  2 VIEW COMPARISON:  Chest x-ray 06/02/2015. FINDINGS: Diffuse peribronchial cuffing. Ill-defined opacities in the region of the lingula and right middle lobe. No confluent consolidative airspace disease. No pleural effusions. No evidence of pulmonary edema. Heart  size is normal. Atherosclerosis in the thoracic aorta. Upper mediastinal contours are within normal limits. Right-sided pacemaker device in place with lead tips projecting over the expected location of the right atrium and right ventricular apex. IMPRESSION: 1. Findings are compatible with bronchitis, potentially with multilobar bronchopneumonia involving the right middle lobe and lingula. Followup PA and lateral chest X-ray is recommended in 3-4 weeks following trial of antibiotic therapy to ensure resolution and exclude underlying malignancy. 2. Aortic atherosclerosis. Electronically Signed   By: Vinnie Langton M.D.   On: 03/08/2016 13:01    Procedures Procedures (including critical care time)  Medications Ordered in ED Medications  azithromycin (ZITHROMAX) 500 mg in dextrose 5 % 250 mL IVPB (not administered)  sodium chloride 0.9 % bolus 1,000 mL (not administered)  methylPREDNISolone sodium succinate (SOLU-MEDROL) 125 mg/2 mL injection 125 mg (not administered)  albuterol (PROVENTIL,VENTOLIN) solution continuous neb (not administered)  albuterol (PROVENTIL) (2.5 MG/3ML) 0.083% nebulizer solution 5 mg (5 mg Nebulization Given 03/08/16 1211)  sodium chloride 0.9 % bolus 1,000 mL (1,000 mLs Intravenous New Bag/Given 03/08/16 1340)  cefTRIAXone (ROCEPHIN) 1 g in dextrose 5 % 50 mL IVPB (1 g Intravenous New Bag/Given 03/08/16 1340)     Initial Impression / Assessment and Plan / ED Course  I have reviewed the triage vital signs and the nursing notes.  Pertinent labs & imaging results that were available during my care of the patient were reviewed by me and considered in my medical decision making (see chart for details).  Urine is pending at time of admission.    Pt d/w Dr. Sarajane Jews ([facility]) for admission.  Final Clinical Impressions(s) / ED Diagnoses   Final diagnoses:  Community acquired pneumonia of right middle lobe of lung ([place])  COPD exacerbation ([place])  Jaundice  Anemia,  unspecified type    New Prescriptions New Prescriptions   No medications on file     Isla Pence, MD 03/08/16 1411

## 2016-03-08 NOTE — ED Triage Notes (Signed)
Patient complains of shortness of breath that started 2 days ago. States cough and congestion and generalized weakness. Denies N/V/D.

## 2016-03-08 NOTE — ED Notes (Cosign Needed)
CRITICAL VALUE ALERT  Critical value received:  Troponin 0.03  Date of notification:  03/08/16  Time of notification:  [id number]  Critical value read back:Yes.    Nurse who received alert:  Laurell Josephs RN  MD notified (1st page):  Dr. Gilford Raid  Time of first page:  1334  MD notified (2nd page):  Time of second page:  Responding MD:  Gilford Raid  Time MD responded:  [phone]

## 2016-03-08 NOTE — H&P (Signed)
History and Physical  STAS BLACKLEDGE [id number] DOB: September 20, 1945 DOA: 03/08/2016  PCP: [facility]  Patient coming from: home  Chief Complaint: short of breath  HPI:  71 year old man PMH diabetes mellitus presented with several day history of increasing shortness of breath and productive cough. Initial evaluation revealed multiple abnormalities including multifocal bronchopneumonia, elevated lactic acid, elevated LFTs modest hyperkalemia and anemia.  Patient reports has been doing fairly well, over the last 48 hours he developed increasing shortness of breath with associated productive cough, worse with exertion, better with rest. He's also has some lower chest pain from coughing with some radiation under the right rib cage and to the epigastrium. He denies any liver problems, no alcohol use, no drug use.  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 This has been healing well with home health wound care.  ED Course: Afebrile, vitals stable. Treated with albuterol, Zithromax, ceftriaxone, methylprednisolone, 2 L normal saline Pertinent labs: Potassium 5.5, lipase within normal limits, AST 159, ALT 75, total bilirubin 6.0, troponin 0.03, and lactic acid 3.49, hemoglobin 8.6, fecal occult blood negative Imaging: CXR independently reviewed: Bronchitis. Possible multilobar pneumonia involving right middle lobe and lingula. Concur with radiologist interpretation.  Review of Systems:  Negative for fever, visual changes, sore throat, rash, new muscle aches,  SOB, dysuria, bleeding, n/v/abdominal pain.  Past Medical History:  Diagnosis Date  . Atrial fibrillation ([place])   . COPD (chronic obstructive pulmonary disease) ([place])   . Diabetes mellitus   . Neuropathy ([place])   . Pacemaker   . PTSD (post-traumatic stress disorder)   . Spinal stenosis     Past Surgical History:  Procedure Laterality Date  . CHOLECYSTECTOMY       reports that he  has never smoked. He has never used smokeless tobacco. He reports that he does not drink alcohol or use drugs. Mobility:Ambulatory  No Known Allergies  Family History  Problem Relation Age of Onset  . Heart disease Mother   . Prostate cancer Brother   . Kidney cancer Brother      Prior to Admission medications   Medication Sig Start Date End Date Taking? Authorizing Provider  acetaminophen (TYLENOL) 325 MG tablet Take 975 mg by mouth 2 (two) times daily.   Yes Historical Provider, MD  albuterol (PROVENTIL HFA;VENTOLIN HFA) 108 (90 BASE) MCG/ACT inhaler Inhale 2 puffs into the lungs every 6 (six) hours as needed. Shortness of breath    Yes Historical Provider, MD  atorvastatin (LIPITOR) 80 MG tablet Take 80 mg by mouth daily.    Yes Historical Provider, MD  dabigatran (PRADAXA) 150 MG CAPS capsule Take 150 mg by mouth every 12 (twelve) hours.   Yes Historical Provider, MD  diphenhydramine-acetaminophen (TYLENOL PM) 25-500 MG TABS Take 2 tablets by mouth at bedtime.   Yes Historical Provider, MD  finasteride (PROSCAR) 5 MG tablet Take 5 mg by mouth at bedtime.    Yes Historical Provider, MD  fluticasone (FLONASE) 50 MCG/ACT nasal spray Place 1 spray into both nostrils daily as needed for allergies or rhinitis.   Yes Historical Provider, MD  glipiZIDE (GLUCOTROL) 10 MG tablet Take 20 mg by mouth daily as needed (if BG >150).   Yes Historical Provider, MD  insulin glargine (LANTUS) 100 UNIT/ML injection Inject 15 Units into the skin as needed. If BG > 200, inject 15 units If BG > 225, inject 20 units   Yes Historical Provider, MD  lactulose ([place]) 10 GM/15ML  solution Take 10 g by mouth daily as needed for mild constipation.   Yes Historical Provider, MD  levothyroxine (SYNTHROID, LEVOTHROID) 100 MCG tablet Take 200 mcg by mouth every morning.    Yes Historical Provider, MD  mometasone [facility]) 220 MCG/INH inhaler Inhale 2 puffs into the lungs 2 (two) times daily. May use at home  medication   Yes Historical Provider, MD  nortriptyline (PAMELOR) 10 MG capsule Take 10 mg by mouth at bedtime.   Yes Historical Provider, MD  omeprazole (PRILOSEC) 20 MG capsule Take 20 mg by mouth at bedtime.    Yes Historical Provider, MD  oxyCODONE (OXY IR/ROXICODONE) 5 MG immediate release tablet Take 5 mg by mouth 3 (three) times daily as needed for moderate pain.    Yes Historical Provider, MD  pregabalin (LYRICA) 100 MG capsule Take 200 mg by mouth 2 (two) times daily.    Yes Historical Provider, MD  traZODone (DESYREL) 50 MG tablet Take 50 mg by mouth at bedtime as needed for sleep.    Yes Historical Provider, MD    Physical Exam: Vitals:   03/08/16 1400 03/08/16 1515 03/08/16 1536 03/08/16 1605  BP: 115/61 118/58 132/69   Pulse: 62 65 66   Resp: 17 17 16    Temp:  98 F (36.7 C) 98.2 F (36.8 C)   TempSrc:  Oral Oral   SpO2: 98% 98% 91%   Weight:    94.9 kg (209 lb 4.8 oz)  Height:   6' (1.829 m)     Constitutional:  . Appears calm and comfortable Eyes:  . Pupils and irises appear normal. Mild scleral icterus. . Normal lids ENMT:  . external ears, nose appear normal . grossly normal hearing . Lips appear normal Neck:  . neck appears normal, no masses . no thyromegaly Respiratory:  . Bilateral wheezes. No rhonchi or rales. Marland Kitchen Respiratory effort normal. Speaks in full sentences. Cardiovascular:  . RRR, no m/r/g . No LE extremity edema   Abdomen:  . Abdomen appears normal; soft, nontender, nondistended. . No hernias noted Musculoskeletal:  . Digits/nails both hands: no clubbing, cyanosis, petechiae, infection . RUE, LUE, RLE, LLE   o strength and tone normal, no atrophy, no abnormal movements o No tenderness, masses Skin:  . Right fourth toe wound healing well. There is a shallow ulcer over the right lateral malleolus without evidence of infection. . palpation of skin: no induration or nodules Psychiatric:  . judgement and insight appear normal . Mental  status o Mood, affect appropriate  Wt Readings from Last 3 Encounters:  03/08/16 94.9 kg (209 lb 4.8 oz)  11/15/15 97.3 kg (214 lb 9.9 oz)  06/02/15 105.2 kg (232 lb)    I have personally reviewed following labs and imaging studies  Labs on Admission:  CBC:  Recent Labs Lab 03/08/16 1216  WBC 7.5  HGB 8.6*  HCT 27.3*  MCV 99.6  PLT [id number]   Basic Metabolic Panel:  Recent Labs Lab 03/08/16 1216  NA 135  K 5.5*  CL 101  CO2 22  GLUCOSE 160*  BUN 31*  CREATININE 1.14  CALCIUM 9.0   Liver Function Tests:  Recent Labs Lab 03/08/16 1216  AST 159*  ALT 75*  ALKPHOS 355*  BILITOT 6.0*  PROT 6.9  ALBUMIN 3.3*    Recent Labs Lab 03/08/16 1228  LIPASE 29     Recent Labs Lab 03/08/16 1216  TROPONINI 0.03*   Urine analysis:  Radiological Exams on Admission: Dg Chest 2 View  Result Date: 03/08/2016 CLINICAL DATA:  71 year old male with dyspnea on exertion. Cough and congestion for the past 2-3 days. EXAM: CHEST  2 VIEW COMPARISON:  Chest x-ray 06/02/2015. FINDINGS: Diffuse peribronchial cuffing. Ill-defined opacities in the region of the lingula and right middle lobe. No confluent consolidative airspace disease. No pleural effusions. No evidence of pulmonary edema. Heart size is normal. Atherosclerosis in the thoracic aorta. Upper mediastinal contours are within normal limits. Right-sided pacemaker device in place with lead tips projecting over the expected location of the right atrium and right ventricular apex. IMPRESSION: 1. Findings are compatible with bronchitis, potentially with multilobar bronchopneumonia involving the right middle lobe and lingula. Followup PA and lateral chest X-ray is recommended in 3-4 weeks following trial of antibiotic therapy to ensure resolution and exclude underlying malignancy. 2. Aortic atherosclerosis. Electronically Signed   By: Vinnie Langton M.D.   On: 03/08/2016 13:01    EKG: Independently reviewed: Atrially paced rhythm  with normal rate.  Principal Problem:   Lobar pneumonia ([place]) Active Problems:   Atrial fibrillation, chronic (HCC)   Diabetes type 2, controlled (HCC)   Elevated LFTs   Elevated troponin  Assessment/Plan 1. Multi lobar pneumonia, no SIRS criteria, no evidence of sepsis. Hemodynamics within normal limits. No hypoxia.  Empiric antibiotics, follow-up culture data 2. Elevated LFTs, mixed pattern, unclear etiology and significance. Does have mild right upper quadrant pain by report although not by examination. Status post cholecystectomy. Lipase within normal limits. Denies drugs. Denies alcohol. Mild scleral icterus noted.   Plan repeat hepatic function panel. Check right upper quadrant ultrasound. Repeat laboratory studies in the morning.  No Tylenol. Hold Lipitor. 3. Elevated troponin minimal. Atypical chest pain. EKG nonacute but paced rhythm noted. Doubt ACS.  Repeat troponin 1. 4. Normocytic anemia, suspect anemia of chronic disease. No evidence of bleeding. 5. Atrial fibrillation. Continue dabigatran. 6. Diabetes mellitus type 2. Random blood sugar 160. Anion gap normal.  Sliding scale insulin. Continue Lantus.   Admit to medical floor.  It is my clinical opinion that admission to INPATIENT is reasonable and necessary in this patient  Given the aforementioned, the predictability of an adverse outcome is felt to be significant. I expect that the patient will require at least 2 midnights in the hospital to treat this condition.  DVT prophylaxis:SCDs Code Status: full code Family Communication: none Consults called: none  Time spent: 60 minutes  Murray Hodgkins, MD  [facility] Direct contact: [phone] --[place]ia [place]  --[email or web address]; password [email or web address]  7PM-7AM contact night coverage as above  03/08/2016, 4:55 PM

## 2016-03-09 ENCOUNTER — Inpatient Hospital Stay (HOSPITAL_COMMUNITY): Payer: Medicare PPO

## 2016-03-09 LAB — LEGIONELLA PNEUMOPHILA SEROGP 1 UR AG: L. PNEUMOPHILA SEROGP 1 UR AG: NEGATIVE

## 2016-03-09 LAB — COMPREHENSIVE METABOLIC PANEL
ALBUMIN: 3 g/dL — AB (ref 3.5–5.0)
ALT: 76 U/L — ABNORMAL HIGH (ref 17–63)
ANION GAP: 10 (ref 5–15)
AST: 145 U/L — ABNORMAL HIGH (ref 15–41)
Alkaline Phosphatase: 299 U/L — ABNORMAL HIGH (ref 38–126)
BUN: 32 mg/dL — ABNORMAL HIGH (ref 6–20)
CO2: 21 mmol/L — AB (ref 22–32)
Calcium: 8.5 mg/dL — ABNORMAL LOW (ref 8.9–10.3)
Chloride: 102 mmol/L (ref 101–111)
Creatinine, Ser: 1.07 mg/dL (ref 0.61–1.24)
GFR calc non Af Amer: >60 mL/min (ref >60)
GLUCOSE: 194 mg/dL — AB (ref 65–99)
Potassium: 5 mmol/L (ref 3.5–5.1)
SODIUM: 133 mmol/L — AB (ref 135–145)
Total Bilirubin: 4.3 mg/dL — ABNORMAL HIGH (ref 0.3–1.2)
Total Protein: 6.1 g/dL — ABNORMAL LOW (ref 6.5–8.1)

## 2016-03-09 LAB — HIV ANTIBODY (ROUTINE TESTING W REFLEX): HIV SCREEN 4TH GENERATION: NONREACTIVE

## 2016-03-09 LAB — GLUCOSE, CAPILLARY
GLUCOSE-CAPILLARY: 193 mg/dL — AB (ref 65–99)
GLUCOSE-CAPILLARY: 139 mg/dL — AB (ref 65–99)
Glucose-Capillary: 171 mg/dL — ABNORMAL HIGH (ref 65–99)
Glucose-Capillary: 211 mg/dL — ABNORMAL HIGH (ref 65–99)

## 2016-03-09 LAB — APTT: APTT: 66 s — AB (ref 24–36)

## 2016-03-09 LAB — PROTIME-INR
INR: 2.04
Prothrombin Time: 23.4 seconds — ABNORMAL HIGH (ref 11.4–15.2)

## 2016-03-09 MED ORDER — INSULIN GLARGINE 100 UNIT/ML ~~LOC~~ SOLN
15.0000 [IU] | Freq: Every day | SUBCUTANEOUS | Status: DC
  Administered 2016-03-09 – 2016-03-15 (×7): 15 [IU] via SUBCUTANEOUS
  Filled 2016-03-09 (×9): qty 0.15

## 2016-03-09 MED ORDER — HYDROCORTISONE ACETATE 25 MG RE SUPP
25.0000 mg | Freq: Two times a day (BID) | RECTAL | Status: DC
  Administered 2016-03-09 – 2016-03-15 (×11): 25 mg via RECTAL
  Filled 2016-03-09 (×14): qty 1

## 2016-03-09 MED ORDER — IOPAMIDOL (ISOVUE-300) INJECTION 61%
100.0000 mL | Freq: Once | INTRAVENOUS | Status: AC | PRN
  Administered 2016-03-09: 100 mL via INTRAVENOUS

## 2016-03-09 MED ORDER — ENSURE ENLIVE PO LIQD
237.0000 mL | Freq: Three times a day (TID) | ORAL | Status: DC
  Administered 2016-03-09 – 2016-03-15 (×13): 237 mL via ORAL

## 2016-03-09 NOTE — Progress Notes (Signed)
CRITICAL VALUE ALERT  Critical value received:  Results of CT Abd and Chest  Date of notification:  03/09/16  Time of notification:  2145  Critical value read back:Yes.    Nurse who received alert:  Elmyra Ricks, RN  MD notified (1st page):  Dr. Olevia Bowens  Time of first page:  2146  MD notified (2nd page):  Time of second page:  Responding MD:  Dr. Olevia Bowens  Time MD responded:  [id number]

## 2016-03-09 NOTE — Progress Notes (Signed)
PROGRESS NOTE  Tony [name] [id number] DOB: [birth year] DOA: 03/08/2016 PCP: [facility]  Brief Narrative: 71 year old man PMH diabetes mellitus presented with several day history of increasing shortness of breath and productive cough. Initial evaluation revealed multiple abnormalities including multifocal bronchopneumonia, elevated lactic acid, elevated LFTs modest hyperkalemia and anemia.  Assessment/Plan 1. Multilobar pneumonia without evidence of sepsis. Afebrile, no hypoxia.  Improving clinically. Continue empiric antibiotics. 2. Elevated LFTs, mixed pattern, suspect secondary to underlying malignancy according to imaging. Trending down. No evidence of obstruction. No history of alcohol use or drug use. Tylenol use 3 g per day.  Obtain advanced imaging given surprising findings of metastatic disease, check CT chest abdomen and pelvis 3. Elevated troponin. Troponins flat. EKG nonacute. No evidence of ACS. No further evaluation suggested. 4. Normocytic anemia, suspect anemia of chronic disease. Fecal occult blood was negative. 5. Hemorrhoids. 6. Atrial fibrillation, stable. 7. Diabetes mellitus type 2, labile. Currently improved.   Plan to continue empiric antibiotics. Repeat hepatic function testing in the morning. Check CT chest abdomen and pelvis, will discuss with patient prior to.  DVT prophylaxis: Pradaxa Code Status: full  Family Communication: none Disposition Plan: home   Murray Hodgkins, MD  [facility] Direct contact: [phone] --[place]ia [place]  --[email or web address]; password [email or web address]  7PM-7AM contact night coverage as above 03/09/2016, 12:22 PM  LOS: 1 day   Consultants:    Procedures:    Antimicrobials:  Azithromycin 1/19 >>  Ceftriaxone 1/19 >>  Interval history/Subjective: Rough night, pain with bowel movements secondary to hemorrhoids. Breathing still short at times. No abdominal pain.   Objective: Vitals:   03/08/16 1536  03/08/16 1605 03/08/16 2228 03/09/16 0645  BP: 132/69  (!) 109/57 120/62  Pulse: 66  65 60  Resp: 16  20 20   Temp: 98.2 F (36.8 C)  98.3 F (36.8 C) 97.5 F (36.4 C)  TempSrc: Oral  Oral Oral  SpO2: 91%  100% 100%  Weight:  94.9 kg (209 lb 4.8 oz)    Height: 6' (1.829 m)       Intake/Output Summary (Last 24 hours) at 03/09/16 1222 Last data filed at 03/08/16 2330  Gross per 24 hour  Intake             2000 ml  Output              230 ml  Net             1770 ml     Filed Weights   03/08/16 1101 03/08/16 1605  Weight: 89.8 kg (198 lb) 94.9 kg (209 lb 4.8 oz)    Exam:    Constitutional:   Appears calm, comfortable, nontoxic  Respiratory:   Clear to auscultation bilaterally, no wheezes, rales or rhonchi. Normal respiratory effort  Cardiovascular:   Regular rate and rhythm. No murmur, rub or gallop   No lower extremity edema  Abdomen:   Soft, nontender, nondistended, no right upper quadrant pain  Psychiatric:   Alert, speech fluent and clear right.  I have personally reviewed following labs and imaging studies:  Blood sugars labile, 400 last night, fasting this morning 193  Potassium improved, 5.0  Creatinine within normal limits  Alkaline phosphatase trending down, 299  AST trending down, 145  ALT stable 76  Total bilirubin trending down, 4.3  Troponins flat, 0.03  INR 2.04 (on Pradaxa)  HIV nonreactive influenza negative  Abdominal ultrasound showed enlarged liver with multiple hepatic masses most concerning for metastatic  disease. CT versus MRI recommended for further evaluation.    Scheduled Meds: . azithromycin  500 mg Oral Q24H  . cefTRIAXone (ROCEPHIN)  IV  1 g Intravenous Q24H  . dabigatran  150 mg Oral Q12H  . feeding supplement (ENSURE ENLIVE)  237 mL Oral BID BM  . finasteride  5 mg Oral QHS  . insulin aspart  0-9 Units Subcutaneous TID WC  . insulin glargine  10 Units Subcutaneous QHS  . levothyroxine  200 mcg Oral q morning -  10a  . nortriptyline  10 mg Oral QHS  . pantoprazole  40 mg Oral Daily  . pregabalin  200 mg Oral BID  . sodium chloride flush  3 mL Intravenous Q12H  . sodium chloride flush  3 mL Intravenous Q12H   Continuous Infusions:  Principal Problem:   Lobar pneumonia (HCC) Active Problems:   Atrial fibrillation, chronic (HCC)   Diabetes type 2, controlled (HCC)   Elevated LFTs   Elevated troponin   LOS: 1 day

## 2016-03-09 NOTE — Progress Notes (Signed)
Patient ID: Tony [name], male   DOB: Mar 12, 1945, 71 y.o.   MRN: [id number] Remote floor coverage:  Received page from the staff in regards to imaging final report. CT scan chest/abdomen/pelvis results reviewed. Findings confirm earlier [place] of abdomen RUQ results. Dr. Sarajane Jews will discuss these with the patient in the morning.  Tennis Must, MD

## 2016-03-09 NOTE — Progress Notes (Signed)
Initial Nutrition Assessment  DOCUMENTATION CODES:  Not applicable  INTERVENTION:  Increase Ensure Enlive po to TID, each supplement provides 350 kcal and 20 grams of protein  If further imaging confirms metastatic disease, can discuss appropriate diet on follow up.   Would like standing scale weight of patient if able   NUTRITION DIAGNOSIS:  Increased nutrient needs related to acute illness, potential cancer as evidenced by Estimated nutritional requirements for this condition  GOAL:  Patient will meet greater than or equal to 90% of their needs  MONITOR:  PO intake, Supplement acceptance, Diet advancement, Labs, Work up  REASON FOR ASSESSMENT:  Malnutrition Screening Tool    ASSESSMENT:  71 y/o male PMHx DM, COPD, PTSD, A fib. Presented with several days SOB. Eval shoed multifocal PNA, elevated Lactic acid, elevated LFTs and modest hyperkalemia/anemia.  Ultrasound revealed suspected metastatic disease.  RD operating remotely. Per chart review, Pt has had congestion, SOB, weakness. Denied N/V/D. No mention of how he has been eating, though on admission MST, did report loss of appetite  Admitted at 198 lbs??. His weight appears to  Fluctuate year to year. Does appear to have a slight downward trend, especially in the last 6 months. Not using outlying initial weight of 198 lbs, He has lost 20-25 lbs in the last 10 months. This is not a clinically significant loss for timeframe.   At this time, his PO intake has been 25-75%. Given increased size and requirements, will increase Ensure to TID.    Labs: BG 160-200 Medications: IV abx, insulin, ppi, Ensure ENlive   Recent Labs Lab 03/08/16 1216 03/09/16 0656  NA 135 133*  K 5.5* 5.0  CL 101 102  CO2 22 21*  BUN 31* 32*  CREATININE 1.14 1.07  CALCIUM 9.0 8.5*  GLUCOSE 160* 194*   Diet Order:  Diet Carb Modified Fluid consistency: Thin; Room service appropriate? Yes  Skin: Non pressure wound to right ankle - full  thickness  Last BM:  Unknown  Height:  Ht Readings from Last 1 Encounters:  03/08/16 6' (1.829 m)   Weight:  Wt Readings from Last 1 Encounters:  03/08/16 209 lb 4.8 oz (94.9 kg)   Wt Readings from Last 10 Encounters:  03/08/16 209 lb 4.8 oz (94.9 kg)  11/15/15 214 lb 9.9 oz (97.3 kg)  06/02/15 232 lb (105.2 kg)  09/06/14 235 lb (106.6 kg)  08/23/14 239 lb 9.6 oz (108.7 kg)  05/28/14 228 lb (103.4 kg)  01/04/14 220 lb (99.8 kg)  12/12/12 221 lb 9 oz (100.5 kg)  05/12/12 252 lb 3.3 oz (114.4 kg)  04/03/12 252 lb 6.8 oz (114.5 kg)  Additionally wt measurement via Care Everywhere: Was 222 lbs in June.   Ideal Body Weight:  80.91 kg  BMI:  Body mass index is 28.39 kg/m.  Estimated Nutritional Needs:  Kcal:  2100-2300 (22-24 kcal/kg bw) Protein:  97-113 g (1.2-1.4 g/kg IBW) Fluid:  2.1-2.3 Liters  EDUCATION NEEDS:  Education needs no appropriate at this time  Burtis Junes RD, LDN, [place] Clinical Nutrition Pager: [phone] 03/09/2016 2:31 PM

## 2016-03-10 ENCOUNTER — Encounter (HOSPITAL_COMMUNITY): Payer: Self-pay | Admitting: Gastroenterology

## 2016-03-10 DIAGNOSIS — K8689 Other specified diseases of pancreas: Secondary | ICD-10-CM

## 2016-03-10 DIAGNOSIS — R918 Other nonspecific abnormal finding of lung field: Secondary | ICD-10-CM

## 2016-03-10 DIAGNOSIS — R7989 Other specified abnormal findings of blood chemistry: Secondary | ICD-10-CM

## 2016-03-10 DIAGNOSIS — R160 Hepatomegaly, not elsewhere classified: Secondary | ICD-10-CM

## 2016-03-10 HISTORY — DX: Other specified diseases of pancreas: K86.89

## 2016-03-10 LAB — COMPREHENSIVE METABOLIC PANEL
ALBUMIN: 2.9 g/dL — AB (ref 3.5–5.0)
ALK PHOS: 312 U/L — AB (ref 38–126)
ALT: 77 U/L — AB (ref 17–63)
ANION GAP: 11 (ref 5–15)
AST: 128 U/L — AB (ref 15–41)
BILIRUBIN TOTAL: 5.7 mg/dL — AB (ref 0.3–1.2)
BUN: 31 mg/dL — AB (ref 6–20)
CALCIUM: 8.8 mg/dL — AB (ref 8.9–10.3)
CO2: 24 mmol/L (ref 22–32)
Chloride: 98 mmol/L — ABNORMAL LOW (ref 101–111)
Creatinine, Ser: 0.86 mg/dL (ref 0.61–1.24)
GFR calc Af Amer: >60 mL/min (ref >60)
GFR calc non Af Amer: >60 mL/min (ref >60)
GLUCOSE: 145 mg/dL — AB (ref 65–99)
Potassium: 4.9 mmol/L (ref 3.5–5.1)
SODIUM: 133 mmol/L — AB (ref 135–145)
TOTAL PROTEIN: 6.1 g/dL — AB (ref 6.5–8.1)

## 2016-03-10 LAB — CBC
HCT: 24.4 % — ABNORMAL LOW (ref 39.0–52.0)
Hemoglobin: 7.7 g/dL — ABNORMAL LOW (ref 13.0–17.0)
MCH: 32.4 pg (ref 26.0–34.0)
MCHC: 31.6 g/dL (ref 30.0–36.0)
MCV: 102.5 fL — AB (ref 78.0–100.0)
PLATELETS: 166 10*3/uL (ref 150–400)
RBC: 2.38 MIL/uL — AB (ref 4.22–5.81)
RDW: 27.4 % — ABNORMAL HIGH (ref 11.5–15.5)
WBC: 7.9 10*3/uL (ref 4.0–10.5)

## 2016-03-10 LAB — GLUCOSE, CAPILLARY
Glucose-Capillary: 122 mg/dL — ABNORMAL HIGH (ref 65–99)
Glucose-Capillary: 128 mg/dL — ABNORMAL HIGH (ref 65–99)
Glucose-Capillary: 168 mg/dL — ABNORMAL HIGH (ref 65–99)
Glucose-Capillary: 133 mg/dL — ABNORMAL HIGH (ref 65–99)

## 2016-03-10 LAB — AMMONIA: Ammonia: 21 umol/L (ref 9–35)

## 2016-03-10 MED ORDER — CEFUROXIME AXETIL 250 MG PO TABS
500.0000 mg | ORAL_TABLET | Freq: Two times a day (BID) | ORAL | Status: DC
  Administered 2016-03-11 – 2016-03-16 (×11): 500 mg via ORAL
  Filled 2016-03-10 (×11): qty 2

## 2016-03-10 MED ORDER — GUAIFENESIN ER 600 MG PO TB12
600.0000 mg | ORAL_TABLET | Freq: Two times a day (BID) | ORAL | Status: DC
  Administered 2016-03-10 – 2016-03-16 (×14): 600 mg via ORAL
  Filled 2016-03-10 (×14): qty 1

## 2016-03-10 MED ORDER — ONDANSETRON HCL 4 MG/2ML IJ SOLN
4.0000 mg | Freq: Once | INTRAMUSCULAR | Status: AC
  Administered 2016-03-10: 4 mg via INTRAVENOUS
  Filled 2016-03-10: qty 2

## 2016-03-10 MED ORDER — VITAMIN K1 10 MG/ML IJ SOLN
10.0000 mg | Freq: Every day | INTRAVENOUS | Status: AC
  Administered 2016-03-10 – 2016-03-12 (×3): 10 mg via INTRAVENOUS
  Filled 2016-03-10 (×3): qty 1

## 2016-03-10 MED ORDER — HYDROCOD POLST-CPM POLST ER 10-8 MG/5ML PO SUER
5.0000 mL | Freq: Once | ORAL | Status: AC
  Administered 2016-03-10: 5 mL via ORAL
  Filled 2016-03-10: qty 5

## 2016-03-10 MED ORDER — MORPHINE SULFATE (PF) 2 MG/ML IV SOLN
2.0000 mg | Freq: Once | INTRAVENOUS | Status: AC
  Administered 2016-03-10: 2 mg via INTRAVENOUS
  Filled 2016-03-10: qty 1

## 2016-03-10 NOTE — Progress Notes (Signed)
Dr. Olevia Bowens was paged regarding patient confusion, restlessness, persistent cough.  Patient states that he does not know what is wrong. Morphine IV, Zofran IV, and Mucinex PO given per orders.

## 2016-03-10 NOTE — Consult Note (Signed)
Referring Provider: No ref. provider found Primary Care Physician:  [facility] Primary Gastroenterologist:  Barney Drain  Reason for Consultation:  METASTATIC CANCER   Impression: PRESENTED WITH SOB. ADMITTED FOR PNA AND SUBSEQUENTLY FOUND TO HAVE ELEVATED LIVER ENZYMES. US SHOWED METS TO LIVER. CT CHEST/ABD/PELVIS REVEALED MASS IN HOP AND DIFFUSE LIVER METS WITHOUT BILIARY OBSTRUCTION. PT ON PRADAXA FOR AFIB AND PMHx: EMBOLIC DISEASE TO TOE JUNE 2017 & INR > 1.5. LAST DOSE PRADAXA JUN 21 @1000 . INR EITHER FALSELY ELEVATED DUE TO PRADAXA, SECONDARY TO NUTRITIONAL DEFICIENCY/ABX OR DUE TO DIFFUSE HEPATOCELLULAR DISEASE.OR   Plan: 1. I PERSONALLY REVIEWED THE CT WITH DR. Barbie Banner. PT COULD HAVE A LIVER BIOPSY BUT INR NEEDS TO BE < 1.5 AND PRADAXA HELD FOR US GUIDED LIVER BIOPSY on TUE OR WED.  2. CONSULT ONCOLOGY. 3. SUPPORTIVE CARE 4. VITAMIN K 10 MG IV or PO QD FOR 3 DAYS. Pt/inr in am.     HPI:   Been feeling bad for past week. Having trouble catching his breath.abx made him feel better. BREATHING IS BETTER. APPETITE DOWN SINCE HAD A COUGH OVER 1 WEEK AGO. GETTING WEAK. HAD FEVER AND CHILLS OFF AND ON. BMs: 1-2X/ WEEK. TROUBLE MOVING BOWELS FOR PAST AT LEAST A MONTH. USING LAXATIVES  TO HELP HAVE BM AND LESS FREQUENT BMs AFTER STARTED EATING BANANAS AND DRINKING COFFEE. WEIGHT: GOING DOWN ON PURPOSE BECAUSE OF THE DIABETES(A1C). AUG 2017: 222 LBS AND NOW 209 LBS. NEVER TOBACCO OR ETOH. NO CLAY COLORED STOOLS OR ITCHING. OCCASIONAL HEARTBURN.   PT DENIES FEVER, CHILLS, HEMATOCHEZIA, HEMATEMESIS, nausea, vomiting, melena, diarrhea, CHEST PAIN,   CHANGE IN BOWEL IN HABITS, abdominal pain, problems swallowing, OR problems with sedation.  Past Medical History:  Diagnosis Date  . Atrial fibrillation ([place])   . COPD (chronic obstructive pulmonary disease) ([place])   . Diabetes mellitus   . Neuropathy ([place])   . Pacemaker   . PTSD (post-traumatic stress disorder)   . Spinal stenosis      Past Surgical History:  Procedure Laterality Date  . CHOLECYSTECTOMY      Prior to Admission medications   Medication Sig Start Date End Date Taking? Authorizing Provider  acetaminophen (TYLENOL) 325 MG tablet Take 975 mg by mouth 2 (two) times daily.      albuterol (PROVENTIL HFA;VENTOLIN HFA) 108 (90 BASE) MCG/ACT inhaler Inhale 2 puffs into the lungs every 6 (six) hours as needed. Shortness of breath       atorvastatin (LIPITOR) 80 MG tablet Take 80 mg by mouth daily.       dabigatran (PRADAXA) 150 MG CAPS capsule Take 150 mg by mouth every 12 (twelve) hours.      diphenhydramine-acetaminophen (TYLENOL PM) 25-500 MG TABS Take 2 tablets by mouth at bedtime.      finasteride (PROSCAR) 5 MG tablet Take 5 mg by mouth at bedtime.       fluticasone (FLONASE) 50 MCG/ACT nasal spray Place 1 spray into both nostrils daily as needed for allergies or rhinitis.      glipiZIDE (GLUCOTROL) 10 MG tablet Take 20 mg by mouth daily as needed (if BG >150).      insulin glargine (LANTUS) 100 UNIT/ML injection Inject 15 Units into the skin as needed. If BG > 200, inject 15 units If BG > 225, inject 20 units      lactulose (CHRONULAC) 10 GM/15ML solution Take 10 g by mouth daily as needed for mild constipation.      levothyroxine (SYNTHROID, LEVOTHROID) 100  MCG tablet Take 200 mcg by mouth every morning.       mometasone (ASMANEX) 220 MCG/INH inhaler Inhale 2 puffs into the lungs 2 (two) times daily. May use at home medication      nortriptyline (PAMELOR) 10 MG capsule Take 10 mg by mouth at bedtime.      omeprazole (PRILOSEC) 20 MG capsule Take 20 mg by mouth at bedtime.       oxyCODONE (OXY IR/ROXICODONE) 5 MG immediate release tablet Take 5 mg by mouth 3 (three) times daily as needed for moderate pain.       pregabalin (LYRICA) 100 MG capsule Take 200 mg by mouth 2 (two) times daily.       traZODone (DESYREL) 50 MG tablet Take 50 mg by mouth at bedtime as needed for sleep.         Current  Facility-Administered Medications  Medication Dose Route Frequency Provider Last Rate Last Dose  . 0.9 %  sodium chloride infusion  250 mL Intravenous PRN     . azithromycin (ZITHROMAX) tablet 500 mg  500 mg Oral Q24H     . cefTRIAXone (ROCEPHIN) 1 g in dextrose 5 % 50 mL IVPB  1 g Intravenous Q24H     . feeding supplement (ENSURE ENLIVE) (ENSURE ENLIVE) liquid 237 mL  237 mL Oral TID BM     . finasteride (PROSCAR) tablet 5 mg  5 mg Oral QHS     . guaiFENesin (MUCINEX) 12 hr tablet 600 mg  600 mg Oral BID     . hydrocortisone (ANUSOL-HC) suppository 25 mg  25 mg Rectal BID     . insulin aspart (novoLOG) injection 0-9 Units  0-9 Units Subcutaneous TID WC     . insulin glargine (LANTUS) injection 15 Units  15 Units Subcutaneous QHS     . lactulose (CHRONULAC) 10 GM/15ML solution 10 g  10 g Oral Daily PRN     . levothyroxine (SYNTHROID, LEVOTHROID) tablet 200 mcg  200 mcg Oral q morning - 10a     . nortriptyline (PAMELOR) capsule 10 mg  10 mg Oral QHS     . oxyCODONE (Oxy IR/ROXICODONE) immediate release tablet 5 mg  5 mg Oral TID PRN     . pantoprazole (PROTONIX) EC tablet 40 mg  40 mg Oral Daily     . phytonadione (VITAMIN K) 10 mg in dextrose 5 % 50 mL IVPB  10 mg Intravenous Daily     . pregabalin (LYRICA) capsule 200 mg  200 mg Oral BID     . sodium chloride flush (NS) 0.9 % injection 3 mL  3 mL Intravenous Q12H     . sodium chloride flush (NS) 0.9 % injection 3 mL  3 mL Intravenous Q12H     . sodium chloride flush (NS) 0.9 % injection 3 mL  3 mL Intravenous PRN     . traZODone (DESYREL) tablet 50 mg  50 mg Oral QHS PRN       Allergies as of 03/08/2016  . (No Known Allergies)    Family History  Problem Relation Age of Onset  . Heart disease Mother   . Prostate cancer Brother   . Kidney cancer Brother   NO COLON CANCER/POLYPS  Social History   Social History  . Marital status: Married FOR ALMOST 30 YEARS    Spouse name: N/A  . Number of children: N/A  . Years of education:  N/A   Occupational History  . Not on file.  Social History Main Topics  . Smoking status: Never Smoker  . Smokeless tobacco: Never Used  . Alcohol use No  . Drug use: No  . 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 [place], [place](NO KNOWN EXPOSURE TO AGENT ORANGE), FT. [place])]. WAS A [name] TRAINED TO PERFORM SURGERY. SERVED AT [employer]. 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 HAS FINISHED HIS SECOND BOOK. AWAITING A PUBLISHER. WAS PLANNING ON SUING [employer]. ORIGINALLY FROM [place].   Review of Systems: PER HPI OTHERWISE ALL SYSTEMS ARE NEGATIVE.  Vitals: Blood pressure (!) 115/57, pulse 68, temperature 98 F (36.7 C), temperature source Oral, resp. rate 18, height 6' (1.829 m), weight 209 lb 4.8 oz (94.9 kg), SpO2 97 %.  Physical Exam: General:   Alert,  Well-developed, well-nourished, pleasant and cooperative in NAD Head:  Normocephalic and atraumatic. Eyes:  Sclera clear, icteric.   Conjunctiva pink. Mouth:  No lesions, dentition abnormal. Neck:  Supple; no masses. Lungs:  Clear throughout to auscultation.  FAIR AIR MOVEMENT, No wheezes. No acute distress. Heart:  Regular rate and rhythm; no murmurs. DISTANT HEART SOUNDS. Abdomen:  Soft, nontender and nondistended. No masses APPRECIATED, OBESE. Normal bowel sounds, without guarding, and without rebound.   Msk:  Symmetrical with gross deformities.  Extremities:  Without edema. Neurologic:  Alert and  oriented x4;  NO  NEW FOCAL DEFICITS Cervical Nodes:  No significant cervical adenopathy. Psych:  Alert and cooperative. Normal mood and affect.  Lab Results:  Recent Labs  03/08/16 1216 03/10/16 0640  WBC 7.5 7.9  HGB 8.6* 7.7*  HCT 27.3* 24.4*  PLT 167 166   BMET  Recent Labs  03/09/16 0656 03/10/16 0640  NA 133* 133*  K 5.0 4.9  CL 102 98*  CO2 21* 24  GLUCOSE 194* 145*  BUN 32* 31*  CREATININE 1.07 0.86   CALCIUM 8.5* 8.8*   LFT  Recent Labs  03/08/16 1713  03/10/16 0640  PROT 6.4*  < > 6.1*  ALBUMIN 3.1*  < > 2.9*  AST 177*  < > 128*  ALT 76*  < > 77*  ALKPHOS 328*  < > 312*  BILITOT 5.1*  < > 5.7*  BILIDIR 2.8*  --   --   IBILI 2.3*  --   --   < > = values in this interval not displayed.   Studies/Results: I PERSONALLY REVIEWED THE CT WITH DR. Barbie Banner. MASS IN HOP WITH LIVER METS-NO BILIARY OBSTRUCTION. PRIOR CXR 2017 AND CT ABD 2009/2015-NO PANCREAS LESION.   LOS: 2 days   Laney Louderback  03/10/2016, 12:12 PM

## 2016-03-10 NOTE — Progress Notes (Signed)
PROGRESS NOTE  Tony [name] [id number] DOB: [birth year] DOA: 03/08/2016 PCP: [place]  Brief Narrative: 71 year old man PMH diabetes mellitus presented with several day history of increasing shortness of breath and productive cough. Initial evaluation revealed multiple abnormalities including multifocal bronchopneumonia, elevated lactic acid, elevated LFTs modest hyperkalemia and anemia.  Assessment/Plan 1. Multilobar pneumonia with sepsis. Afebrile, no hypoxia. Resolving.  Change to oral antibiotics. 2. 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  Amenable to liver biopsy. Stop Pradaxa. Patient from the [place], we'll discuss with case management 1/22, he may need to pursue further diagnosis at the [facility] for financial reasons. Appreciate GI involvement. 3. Elevated troponin. Troponins flat, EKG nonacute. No further evaluation suggested. No evidence of ACS. 4. Anemia of chronic disease/malignancy. Complicated by hemorrhoids. No significant bleeding noted. 5. Hemorrhoids 6. Atrial fibrillation, stable. Stop anticoagulation given above. 7. Diabetes mellitus type 2, blood sugars stable. Continue current management.   Overall improving. Change to oral antibiotics. Discussed with case management tomorrow whether patient will need to pursue biopsy and treatment at the [place] in [place] or whether we can expedite and arrange this locally. Stop anticoagulation.  DVT prophylaxis: Pradaxa Code Status: full  Family Communication: none Disposition Plan: home   Murray Hodgkins, MD  [facility] Direct contact: [phone] --Via [place]  --[email or web address]; password [email or web address]  7PM-7AM contact night coverage as above 03/10/2016, 1:30 PM  LOS: 2 days   Consultants:    Procedures:    Antimicrobials:  Azithromycin 1/19 >>  Ceftriaxone 1/19 >>  Interval history/Subjective: Confused last night. Feels okay today.  Objective: Vitals:   03/08/16  1605 03/08/16 2228 03/09/16 0645 03/09/16 1452  BP:  (!) 109/57 120/62 (!) 115/57  Pulse:  65 60 68  Resp:  20 20 18   Temp:  98.3 F (36.8 C) 97.5 F (36.4 C) 98 F (36.7 C)  TempSrc:  Oral Oral Oral  SpO2:  100% 100% 97%  Weight: 94.9 kg (209 lb 4.8 oz)     Height:        Intake/Output Summary (Last 24 hours) at 03/10/16 1330 Last data filed at 03/10/16 0950  Gross per 24 hour  Intake              653 ml  Output              350 ml  Net              303 ml     Filed Weights   03/08/16 1101 03/08/16 1605  Weight: 89.8 kg (198 lb) 94.9 kg (209 lb 4.8 oz)    Exam: Constitutional: Sitting on the side of bed eating lunch. Appears calm, comfortable. Cardiovascular. Regular rate and rhythm. No murmur, rub or gallop. Respiratory. Clear to auscultation bilaterally. No wheezes, rales or rhonchi. Normal respiratory effort. Psychiatric. Grossly normal mood and affect. Speech fluent and appropriate. Oriented to self, location, year, president. Not month.   I have personally reviewed following labs and imaging studies:  Alkaline phosphatase without significant change, 312. AST 128. ALT 77. No significant change. Total bilirubin without significant change, 5.7. Serum ammonia within normal limits.  Hemoglobin 7.7.   Scheduled Meds: . azithromycin  500 mg Oral Q24H  . [START ON 03/11/2016] cefUROXime  500 mg Oral BID WC  . feeding supplement (ENSURE ENLIVE)  237 mL Oral TID BM  . finasteride  5 mg Oral QHS  . guaiFENesin  600 mg Oral BID  .  hydrocortisone  25 mg Rectal BID  . insulin aspart  0-9 Units Subcutaneous TID WC  . insulin glargine  15 Units Subcutaneous QHS  . levothyroxine  200 mcg Oral q morning - 10a  . nortriptyline  10 mg Oral QHS  . pantoprazole  40 mg Oral Daily  . phytonadione (VITAMIN K) IV  10 mg Intravenous Daily  . pregabalin  200 mg Oral BID  . sodium chloride flush  3 mL Intravenous Q12H  . sodium chloride flush  3 mL Intravenous Q12H   Continuous  Infusions:  Principal Problem:   Lobar pneumonia (HCC) Active Problems:   Atrial fibrillation, chronic (HCC)   Diabetes type 2, controlled (HCC)   Elevated LFTs   Elevated troponin   Liver masses   Lung mass   Pancreatic mass   LOS: 2 days

## 2016-03-11 DIAGNOSIS — K869 Disease of pancreas, unspecified: Secondary | ICD-10-CM

## 2016-03-11 DIAGNOSIS — R160 Hepatomegaly, not elsewhere classified: Secondary | ICD-10-CM

## 2016-03-11 DIAGNOSIS — R17 Unspecified jaundice: Secondary | ICD-10-CM

## 2016-03-11 LAB — COMPREHENSIVE METABOLIC PANEL
ALT: 87 U/L — ABNORMAL HIGH (ref 17–63)
AST: 162 U/L — AB (ref 15–41)
Albumin: 2.9 g/dL — ABNORMAL LOW (ref 3.5–5.0)
Alkaline Phosphatase: 313 U/L — ABNORMAL HIGH (ref 38–126)
Anion gap: 10 (ref 5–15)
BILIRUBIN TOTAL: 7.8 mg/dL — AB (ref 0.3–1.2)
BUN: 28 mg/dL — AB (ref 6–20)
CALCIUM: 8.9 mg/dL (ref 8.9–10.3)
CO2: 25 mmol/L (ref 22–32)
Chloride: 101 mmol/L (ref 101–111)
Creatinine, Ser: 0.85 mg/dL (ref 0.61–1.24)
GFR calc Af Amer: >60 mL/min (ref >60)
Glucose, Bld: 124 mg/dL — ABNORMAL HIGH (ref 65–99)
POTASSIUM: 4.6 mmol/L (ref 3.5–5.1)
Sodium: 136 mmol/L (ref 135–145)
TOTAL PROTEIN: 6.2 g/dL — AB (ref 6.5–8.1)

## 2016-03-11 LAB — GLUCOSE, CAPILLARY
GLUCOSE-CAPILLARY: 176 mg/dL — AB (ref 65–99)
GLUCOSE-CAPILLARY: 153 mg/dL — AB (ref 65–99)
Glucose-Capillary: 118 mg/dL — ABNORMAL HIGH (ref 65–99)
Glucose-Capillary: 158 mg/dL — ABNORMAL HIGH (ref 65–99)

## 2016-03-11 LAB — PROTIME-INR
INR: 1.81
PROTHROMBIN TIME: 21.2 s — AB (ref 11.4–15.2)

## 2016-03-11 NOTE — Care Management Note (Signed)
Case Management Note  Patient Details  Name: Tony [name] MRN: [id number] Date of Birth: 17-Jul-1945  Subjective/Objective:     Patient adm from home with PNA, ind at baseline; active with [facility]. Pancreatic mass with mets to liver and lung.   LIver Biopsy possibly Tues or Wednesday per notes.             Action/Plan: Patient contemplating transfer to [facility] for biopsy. He unsure if he wants to transfer at this time.   Expected Discharge Date:     03/13/2016             Expected Discharge Plan:  [place]  In-House Referral:     Discharge planning Services  CM Consult  Post Acute Care Choice:    Choice offered to:     DME Arranged:    DME Agency:     HH Arranged:    [place] Agency:     Status of Service:  In process, will continue to follow  If discussed at Long Length of Stay Meetings, dates discussed:    Additional Comments:  Tianni Escamilla, Chauncey [place], RN 03/11/2016, 10:06 AM

## 2016-03-11 NOTE — Progress Notes (Signed)
Subjective: Denies abdominal pain, N/V. Knows the year and when asked the city, states "2018". Reoriented easily. Does not want to eat breakfast right now.   Objective: Vital signs in last 24 hours: Temp:  [98.6 F (37 C)-98.9 F (37.2 C)] 98.6 F (37 C) (01/21 2103) Pulse Rate:  [61-62] 62 (01/21 2103) Resp:  [16] 16 (01/21 2103) BP: (101-126)/(52-62) 126/62 (01/21 2103) SpO2:  [98 %-100 %] 98 % (01/21 2103) Last BM Date: 03/10/16 General:   Alert and oriented, pleasant. Jaundiced  Eyes:  Scleral icterus  Abdomen:  Bowel sounds present, round, obese, without guarding or rebound.  Extremities:  Without  edema. Neurologic:  Alert and  oriented to person, year, confused about city but easily reoriented.  Psych:  Alert and cooperative. Normal mood and affect.  Intake/Output from previous day: 01/21 0701 - 01/22 0700 In: 360 [P.O.:260; IV Piggyback:100] Out: 400 [Urine:400] Intake/Output this shift: No intake/output data recorded.  Lab Results:  Recent Labs  03/08/16 1216 03/10/16 0640  WBC 7.5 7.9  HGB 8.6* 7.7*  HCT 27.3* 24.4*  PLT 167 166    BMET  Recent Labs  03/09/16 0656 03/10/16 0640 03/11/16 0634  NA 133* 133* 136  K 5.0 4.9 4.6  CL 102 98* 101  CO2 21* 24 25  GLUCOSE 194* 145* 124*  BUN 32* 31* 28*  CREATININE 1.07 0.86 0.85  CALCIUM 8.5* 8.8* 8.9   LFT  Recent Labs  03/08/16 1713 03/09/16 0656 03/10/16 0640 03/11/16 0634  PROT 6.4* 6.1* 6.1* 6.2*  ALBUMIN 3.1* 3.0* 2.9* 2.9*  AST 177* 145* 128* 162*  ALT 76* 76* 77* 87*  ALKPHOS 328* 299* 312* 313*  BILITOT 5.1* 4.3* 5.7* 7.8*  BILIDIR 2.8*  --   --   --   IBILI 2.3*  --   --   --    PT/INR  Recent Labs  03/09/16 0656 03/11/16 0634  LABPROT 23.4* 21.2*  INR 2.04 1.81     Studies/Results: Ct Chest W Contrast  Result Date: 03/09/2016 CLINICAL DATA:  Increased shortness of breath for several days. Elevated LFTs with multiple hepatic masses demonstrated on ultrasound. No  known malignancy. EXAM: CT CHEST, ABDOMEN, AND PELVIS WITH CONTRAST TECHNIQUE: Multidetector CT imaging of the chest, abdomen and pelvis was performed following the standard protocol during bolus administration of intravenous contrast. CONTRAST:  174mL ISOVUE-300 IOPAMIDOL (ISOVUE-300) INJECTION 61% COMPARISON:  Right upper quadrant ultrasound earlier this day. Chest radiographs yesterday. Noncontrast abdominal CT 12/2015 15 FINDINGS: CT CHEST FINDINGS Cardiovascular: The heart is normal in size. Right-sided pacemaker in place. Thoracic aorta is normal in caliber with trace atherosclerosis of the arch. No filling defects in the central pulmonary arteries. Mediastinum/Nodes: Normal size subcarinal and pretracheal nodes. No pathologically enlarged mediastinal or hilar adenopathy. No axillary adenopathy. No pericardial fluid. Lungs/Pleura: Multifocal pulmonary nodules throughout all lobes of both lungs consistent with metastatic disease. The majority of these nodules are subcentimeter in size, however there is an ovoid 2.0 x 1.3 cm nodule in the perihilar left upper lobe series 3, image 76, with smooth margins. No confluent airspace disease. Mild central bronchial wall thickening. Trace right pleural thickening without frank pleural effusion. Musculoskeletal: Remote fracture of posterior right seventh rib without callus, possible nonunion. No blastic or evidence of destructive lytic lesions. CT ABDOMEN PELVIS FINDINGS Hepatobiliary: Innumerable hypodense hepatic lesions throughout the liver which is enlarged consistent with metastatic disease. Index lesion in the subcapsular right lobe segment 5 measures 3.9 x 4.3 cm  bi- axial measurements, appears confluent with adjacent lesions on coronal reformats. Clips in the gallbladder fossa postcholecystectomy. Pancreas: Ill-defined heterogeneous hypodense mass in the distal pancreatic body measures approximately 3.6 x 4.9 cm. Mild distal pancreatic ductal dilatation. This mass  abuts the splenic artery. This is not about the superior mesenteric artery or vein. Spleen: Enlarged measuring 15.3 cm AP dimension, heterogeneous in density, without well-defined discrete lesion. The splenic vein is not visualized. Adrenals/Urinary Tract: No adrenal nodule. There are multiple bilateral water density lesions throughout both kidneys consistent with simple cysts. Additional small cortical hypodensities are too small to accurately characterize. No hydronephrosis. Symmetric excretion on delayed phase imaging. Stomach/Bowel: Small hiatal hernia. Stomach is physiologically distended. No bowel wall thickening, inflammation or obstruction. Sigmoid and descending colonic diverticulosis without acute diverticulitis. The appendix is not confidently identified. Vascular/Lymphatic: Paraesophageal retrocrural node measures 11 mm. Multiple small lymph nodes at the origin of the celiac and SMA. A 10 mm node is noted in the aortocaval region of the level of the mid kidneys. There are enlarged nodes in the porta hepatis. Mild aorta bi-iliac atherosclerosis without aneurysm. The splenic vein is not visualized in its normal location, collateral vessels in the splenic hilum with a tortuous collateral vein coursing anteriorly in the abdomen, joining abut the superior mesenteric vein just proximal to the portal confluence. Reproductive: Prostate is unremarkable. Other: Small amount of perihepatic ascites.  No free air. Musculoskeletal: Sclerotic focus within the L1 vertebral body is nonspecific, however appears unchanged from CT abdomen 01/04/2014 and is likely a bone island. No destructive lytic lesion. IMPRESSION: 1. Pancreatic mass measures 4.9 cm consistent with primary pancreatic malignancy, characteristics suggesting adenocarcinoma. This lesion abuts the splenic artery. 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 2. Diffuse hepatic metastatic disease. Small volume perihepatic ascites. 3. Diffuse pulmonary metastatic  disease with multiple pulmonary nodules. 4. Metastatic upper abdominal adenopathy. Spleen appears enlarged and heterogeneous but no discrete lesion is seen. These results will be called to the ordering clinician or representative by the Radiologist Assistant, and communication documented in the PACS or zVision Dashboard. Electronically Signed   By: Jeb Levering M.D.   On: 03/09/2016 21:37   Ct Abdomen Pelvis W Contrast  Result Date: 03/09/2016 CLINICAL DATA:  Increased shortness of breath for several days. Elevated LFTs with multiple hepatic masses demonstrated on ultrasound. No known malignancy. EXAM: CT CHEST, ABDOMEN, AND PELVIS WITH CONTRAST TECHNIQUE: Multidetector CT imaging of the chest, abdomen and pelvis was performed following the standard protocol during bolus administration of intravenous contrast. CONTRAST:  118mL ISOVUE-300 IOPAMIDOL (ISOVUE-300) INJECTION 61% COMPARISON:  Right upper quadrant ultrasound earlier this day. Chest radiographs yesterday. Noncontrast abdominal CT 12/2015 15 FINDINGS: CT CHEST FINDINGS Cardiovascular: The heart is normal in size. Right-sided pacemaker in place. Thoracic aorta is normal in caliber with trace atherosclerosis of the arch. No filling defects in the central pulmonary arteries. Mediastinum/Nodes: Normal size subcarinal and pretracheal nodes. No pathologically enlarged mediastinal or hilar adenopathy. No axillary adenopathy. No pericardial fluid. Lungs/Pleura: Multifocal pulmonary nodules throughout all lobes of both lungs consistent with metastatic disease. The majority of these nodules are subcentimeter in size, however there is an ovoid 2.0 x 1.3 cm nodule in the perihilar left upper lobe series 3, image 76, with smooth margins. No confluent airspace disease. Mild central bronchial wall thickening. Trace right pleural thickening without frank pleural effusion. Musculoskeletal: Remote fracture of posterior right seventh rib without callus, possible  nonunion. No blastic or evidence of destructive lytic lesions. CT ABDOMEN  PELVIS FINDINGS Hepatobiliary: Innumerable hypodense hepatic lesions throughout the liver which is enlarged consistent with metastatic disease. Index lesion in the subcapsular right lobe segment 5 measures 3.9 x 4.3 cm bi- axial measurements, appears confluent with adjacent lesions on coronal reformats. Clips in the gallbladder fossa postcholecystectomy. Pancreas: Ill-defined heterogeneous hypodense mass in the distal pancreatic body measures approximately 3.6 x 4.9 cm. Mild distal pancreatic ductal dilatation. This mass abuts the splenic artery. This is not about the superior mesenteric artery or vein. Spleen: Enlarged measuring 15.3 cm AP dimension, heterogeneous in density, without well-defined discrete lesion. The splenic vein is not visualized. Adrenals/Urinary Tract: No adrenal nodule. There are multiple bilateral water density lesions throughout both kidneys consistent with simple cysts. Additional small cortical hypodensities are too small to accurately characterize. No hydronephrosis. Symmetric excretion on delayed phase imaging. Stomach/Bowel: Small hiatal hernia. Stomach is physiologically distended. No bowel wall thickening, inflammation or obstruction. Sigmoid and descending colonic diverticulosis without acute diverticulitis. The appendix is not confidently identified. Vascular/Lymphatic: Paraesophageal retrocrural node measures 11 mm. Multiple small lymph nodes at the origin of the celiac and SMA. A 10 mm node is noted in the aortocaval region of the level of the mid kidneys. There are enlarged nodes in the porta hepatis. Mild aorta bi-iliac atherosclerosis without aneurysm. The splenic vein is not visualized in its normal location, collateral vessels in the splenic hilum with a tortuous collateral vein coursing anteriorly in the abdomen, joining abut the superior mesenteric vein just proximal to the portal confluence.  Reproductive: Prostate is unremarkable. Other: Small amount of perihepatic ascites.  No free air. Musculoskeletal: Sclerotic focus within the L1 vertebral body is nonspecific, however appears unchanged from CT abdomen 01/04/2014 and is likely a bone island. No destructive lytic lesion. IMPRESSION: 1. Pancreatic mass measures 4.9 cm consistent with primary pancreatic malignancy, characteristics suggesting adenocarcinoma. This lesion abuts the splenic artery. 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 2. Diffuse hepatic metastatic disease. Small volume perihepatic ascites. 3. Diffuse pulmonary metastatic disease with multiple pulmonary nodules. 4. Metastatic upper abdominal adenopathy. Spleen appears enlarged and heterogeneous but no discrete lesion is seen. These results will be called to the ordering clinician or representative by the Radiologist Assistant, and communication documented in the PACS or zVision Dashboard. Electronically Signed   By: Jeb Levering M.D.   On: 03/09/2016 21:37   US Abdomen Limited Ruq  Result Date: 03/09/2016 CLINICAL DATA:  Elevated LFTs EXAM: US ABDOMEN LIMITED - RIGHT UPPER QUADRANT COMPARISON:  CT abdomen 01/04/2014 FINDINGS: Gallbladder: Prior cholecystectomy. Common bile duct: Diameter: 4.4 mm Liver: Heterogeneous echotexture with overall hepatic enlargement. Numerous hepatic masses throughout the liver with the largest in the right hepatic lobe measuring 3.9 x 4.1 x 5 cm. IMPRESSION: 1. Enlarged liver with multiple hepatic masses most concerning for metastatic disease. Recommend further evaluation with CT or MRI of the abdomen. Electronically Signed   By: Kathreen Devoid   On: 03/09/2016 09:55    Assessment: 71 year old male admitted with pneumonia, sepsis, and found to have pancreatic head mass and metastatic disease to liver without biliary obstruction (imaging reviewed by Dr. Oneida Alar with Dr. Barbie Banner). 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 INR  elevated at 1.8 likely multifactorial in setting of medication effect, nutritional deficiency, hepatocellular disease. Liver biopsy could be performed if INR is less than 1.5. Awaiting input from case management today as he may need biopsy done at the [place].  Discussed with Dr. Sarajane Jews as well, and we  will know more today. On day 2 of 3 of Vit K IV, last dose 1/23.   Elevated LFTs: in setting of metastatic disease. Imaging reviewed yesterday by Dr. Oneida Alar without evidence of biliary obstruction. Bilirubin bumped today, transaminases continue to fluctuate but remain close to presenting ranges. Acute hepatitis panel ordered on admission and still pending.   Anemia: Hgb 7.7 today but without overt GI bleeding. Multifactorial in setting of malignancy.    Plan: Await decision regarding liver biopsy either here or the [place]. INR will need to be less than 1.5.  Vit K dosing daily, last dose scheduled 1/23 Continue to hold Pradaxa (last dose morning of 1/21)  Oncology consult placed as per plan yesterday Follow H/H, LFTs Follow up on pending hepatitis panel ordered on admission   Annitta Needs, ANP-BC [facility]      LOS: 3 days    03/11/2016, 7:47 AM

## 2016-03-11 NOTE — Progress Notes (Signed)
Chart is reviewed.  Labs and imaging are reviewed.  Patient needs diagnostic liver biopsy.  Once completed, we can provide medical oncology recommendations.  CA 19-9 and CEA are ordered.  Case discussed with Dr. Sarajane Jews.  He is working on biopsy planning.  Robynn Pane, PA-C 03/11/2016 5:45 PM

## 2016-03-11 NOTE — Progress Notes (Signed)
PROGRESS NOTE  Tony [name] [id number] DOB: 1945/05/23 DOA: 03/08/2016 PCP: [place]  Brief Narrative: 71 year old man PMH diabetes mellitus presented with several day history of increasing shortness of breath and productive cough. Initial evaluation revealed multiple abnormalities including multifocal bronchopneumonia, elevated lactic acid, elevated LFTs modest hyperkalemia and anemia.  Assessment/Plan 1. Multilobar pneumonia with sepsis. Afebrile, no hypoxia. Resolving.  Change to oral antibiotics. 2. 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  Amenable to liver biopsy. Pradaxa stopped 1/21. Patient from the [place], we'll discuss with case management today, he may need to pursue further diagnosis at the [facility] for financial reasons. Appreciate GI involvement. 3. Elevated troponin. Troponins flat, EKG nonacute. No further evaluation suggested. No evidence of ACS. 4. Anemia of chronic disease/malignancy. Complicated by hemorrhoids. Stable. 5. Hemorrhoids 6. Atrial fibrillation, remained stable. Anticoagulation on home pending biopsy. 7. Diabetes mellitus type 2, blood sugars remain stable. Continue current management.     DVT prophylaxis: Pradaxa Code Status: full  Family Communication: none Disposition Plan: home   Murray Hodgkins, MD  [facility] Direct contact: [phone] --[place] [place]  --[email or web address]; password [email or web address]  7PM-7AM contact night coverage as above 03/11/2016, 9:59 AM  LOS: 3 days   Consultants:    Procedures:    Antimicrobials:  Azithromycin 1/19 >> 1/23  Ceftin 1/21 >>  Ceftriaxone 1/19 >> 1/21  Interval history/Subjective: Seems to feel okay today. No pain. No nausea or vomiting. Still coughing. Breathing okay.  Objective: Vitals:   03/09/16 0645 03/09/16 1452 03/10/16 1401 03/10/16 2103  BP: 120/62 (!) 115/57 (!) 101/52 126/62  Pulse: 60 68 61 62  Resp: 20 18 16 16   Temp: 97.5 F (36.4  C) 98 F (36.7 C) 98.9 F (37.2 C) 98.6 F (37 C)  TempSrc: Oral Oral Oral Oral  SpO2: 100% 97% 100% 98%  Weight:      Height:        Intake/Output Summary (Last 24 hours) at 03/11/16 0959 Last data filed at 03/11/16 0000  Gross per 24 hour  Intake              360 ml  Output              300 ml  Net               60 ml     Filed Weights   03/08/16 1101 03/08/16 1605  Weight: 89.8 kg (198 lb) 94.9 kg (209 lb 4.8 oz)    Exam: Constitutional. Appears calm, comfortable. Cardiovascular. Regular rate and rhythm. No murmur, rub, gallop. No significant lower extremity edema. Respiratory. Clear to auscultation bilaterally. No wheezes, rales, rhonchi. Normal respiratory effort. Abdomen. Soft, nontender, nondistended.   I have personally reviewed following labs and imaging studies:  INR 1.81.  Alkaline phosphatase without significant change, AST of ALT also without significant change. Total bilirubin up, 7.8.   Scheduled Meds: . azithromycin  500 mg Oral Q24H  . cefUROXime  500 mg Oral BID WC  . feeding supplement (ENSURE ENLIVE)  237 mL Oral TID BM  . finasteride  5 mg Oral QHS  . guaiFENesin  600 mg Oral BID  . hydrocortisone  25 mg Rectal BID  . insulin aspart  0-9 Units Subcutaneous TID WC  . insulin glargine  15 Units Subcutaneous QHS  . levothyroxine  200 mcg Oral q morning - 10a  . nortriptyline  10 mg Oral QHS  . pantoprazole  40 mg  Oral Daily  . phytonadione (VITAMIN K) IV  10 mg Intravenous Daily  . pregabalin  200 mg Oral BID  . sodium chloride flush  3 mL Intravenous Q12H  . sodium chloride flush  3 mL Intravenous Q12H   Continuous Infusions:  Principal Problem:   Lobar pneumonia (HCC) Active Problems:   Atrial fibrillation, chronic (HCC)   Diabetes type 2, controlled (HCC)   Elevated LFTs   Elevated troponin   Liver masses   Lung mass   Pancreatic mass   Jaundice   LOS: 3 days

## 2016-03-12 ENCOUNTER — Encounter (HOSPITAL_COMMUNITY): Payer: Self-pay | Admitting: Radiology

## 2016-03-12 ENCOUNTER — Inpatient Hospital Stay (HOSPITAL_COMMUNITY): Payer: Medicare PPO

## 2016-03-12 DIAGNOSIS — D649 Anemia, unspecified: Secondary | ICD-10-CM

## 2016-03-12 LAB — GLUCOSE, CAPILLARY
GLUCOSE-CAPILLARY: 157 mg/dL — AB (ref 65–99)
Glucose-Capillary: 154 mg/dL — ABNORMAL HIGH (ref 65–99)
Glucose-Capillary: 181 mg/dL — ABNORMAL HIGH (ref 65–99)
Glucose-Capillary: 118 mg/dL — ABNORMAL HIGH (ref 65–99)

## 2016-03-12 LAB — HEPATIC FUNCTION PANEL
ALT: 113 U/L — ABNORMAL HIGH (ref 17–63)
AST: 217 U/L — AB (ref 15–41)
Albumin: 3.2 g/dL — ABNORMAL LOW (ref 3.5–5.0)
Alkaline Phosphatase: 327 U/L — ABNORMAL HIGH (ref 38–126)
BILIRUBIN TOTAL: 11.4 mg/dL — AB (ref 0.3–1.2)
Bilirubin, Direct: 6.1 mg/dL — ABNORMAL HIGH (ref 0.1–0.5)
Indirect Bilirubin: 5.3 mg/dL — ABNORMAL HIGH (ref 0.3–0.9)
Total Protein: 6.6 g/dL (ref 6.5–8.1)

## 2016-03-12 LAB — HEPATITIS PANEL, ACUTE
HCV Ab: <0.1 s/co ratio (ref 0.0–0.9)
HEP A IGM: NEGATIVE
HEP B S AG: NEGATIVE
Hep B C IgM: NEGATIVE

## 2016-03-12 LAB — CBC
HCT: 30 % — ABNORMAL LOW (ref 39.0–52.0)
Hemoglobin: 9.3 g/dL — ABNORMAL LOW (ref 13.0–17.0)
MCH: 33.3 pg (ref 26.0–34.0)
MCHC: 31 g/dL (ref 30.0–36.0)
MCV: 107.5 fL — ABNORMAL HIGH (ref 78.0–100.0)
Platelets: 192 10*3/uL (ref 150–400)
RBC: 2.79 MIL/uL — AB (ref 4.22–5.81)
RDW: 29.5 % — ABNORMAL HIGH (ref 11.5–15.5)
WBC: 11.5 10*3/uL — ABNORMAL HIGH (ref 4.0–10.5)

## 2016-03-12 LAB — PROTIME-INR
INR: 1.77
PROTHROMBIN TIME: 20.8 s — AB (ref 11.4–15.2)

## 2016-03-12 MED ORDER — IOPAMIDOL (ISOVUE-300) INJECTION 61%
75.0000 mL | Freq: Once | INTRAVENOUS | Status: AC | PRN
  Administered 2016-03-12: 75 mL via INTRAVENOUS

## 2016-03-12 MED ORDER — GUAIFENESIN-DM 100-10 MG/5ML PO SYRP
5.0000 mL | ORAL_SOLUTION | ORAL | Status: DC | PRN
  Administered 2016-03-12 – 2016-03-16 (×4): 5 mL via ORAL
  Filled 2016-03-12 (×4): qty 5

## 2016-03-12 MED ORDER — BENZONATATE 100 MG PO CAPS
200.0000 mg | ORAL_CAPSULE | Freq: Once | ORAL | Status: AC | PRN
Start: 2016-03-12 — End: 2016-03-13
  Administered 2016-03-13: 200 mg via ORAL
  Filled 2016-03-12: qty 2

## 2016-03-12 NOTE — Progress Notes (Signed)
PROGRESS NOTE  Tony [name] [id number] DOB: 1945/09/03 DOA: 03/08/2016 PCP: [place]  Brief Narrative: 71 year old man PMH diabetes mellitus presented with several day history of increasing shortness of breath and productive cough. Initial evaluation revealed multiple abnormalities including multifocal bronchopneumonia, elevated lactic acid, elevated LFTs modest hyperkalemia and anemia. Pneumonia quickly improved with empiric antibiotics but LFTs did not. Further imaging revealed pancreatic mass with substantial metastatic disease to the liver and lungs. Patient elected to proceed with biopsy here rather than [facility], tentatively planned for 1/24. Thereafter will follow up with oncology for further recommendations.  Assessment/Plan 1. 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  Patient and wife both desired to proceed with liver biopsy here (will be coordinated with [facility]). Continue vitamin K, repeat INR in the morning.  Oncology aware, following peripherally pending biopsy results. 2. 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 LFTs worse today. Defer management to gastroenterology. 3. Multilobar pneumonia with sepsis. Afebrile, no hypoxia. Appears resolved at this point.  Continue oral antibiotics 4. Elevated troponin. Troponins flat, EKG nonacute. No further evaluation suggested. No evidence of ACS. 5. Anemia of chronic disease/malignancy. Complicated by hemorrhoids. Improved today. 6. Atrial fibrillation, stable. Anticoagulation on hold until after biopsy. 7. Diabetes mellitus type 2. Blood sugars remain stable. 8. Confusion. Wife has wondered whether he might be developing dementia over the last several months. Appears to be stable. Serum ammonia several days ago was unremarkable.   Patient remains ill but not toxic. Pneumonia appears to be resolving rapidly. Hopefully can obtain  liver biopsy tomorrow. Unfortunately LFTs worsening. Prognosis poor.  Once biopsy obtained may be able to go home with close outpatient follow-up.  DVT prophylaxis: SCDs Code Status: full  Family Communication: wife at bedside Disposition Plan: home   Murray Hodgkins, MD  [facility] Direct contact: [phone] --[address] [place]  --[email or web address]; password [email or web address]  7PM-7AM contact night coverage as above 03/12/2016, 2:07 PM  LOS: 4 days   Consultants:    Procedures:    Antimicrobials:  Azithromycin 1/19 >> 1/23  Ceftin 1/21 >>  Ceftriaxone 1/19 >> 1/21  Interval history/Subjective: History vague, seems to be feeling okay. Wife at bedside feels like he has improved substantially. She notes some confusion. She is observed some memory loss over the last several months and was wondering whether he may be developing Alzheimer's dementia.  Objective: Vitals:   03/10/16 1401 03/10/16 2103 03/11/16 2035 03/12/16 0556  BP: (!) 101/52 126/62 129/65 103/64  Pulse: 61 62 64 60  Resp: 16 16 16 18   Temp: 98.9 F (37.2 C) 98.6 F (37 C) 99.1 F (37.3 C) 97.5 F (36.4 C)  TempSrc: Oral Oral Oral Axillary  SpO2: 100% 98% 99% 98%  Weight:      Height:        Intake/Output Summary (Last 24 hours) at 03/12/16 1407 Last data filed at 03/12/16 0900  Gross per 24 hour  Intake               50 ml  Output              200 ml  Net             -150 ml     Filed Weights   03/08/16 1101 03/08/16 1605  Weight: 89.8 kg (198 lb) 94.9 kg (209 lb 4.8 oz)    Exam: Constitutional. Appears slightly confused, however, uncomfortable.  Respiratory. Clear to  auscultation bilaterally. No wheezes, rales or rhonchi. Normal respiratory effort.  Cardiovascular. Regular rate and rhythm. No murmur, rub or gallop. No significant lower extremity edema.  Abdomen soft, nontender, nondistended.  Psychiatric. Mildly confused. Oriented to self, location. Not month or year.  I have  personally reviewed following labs and imaging studies:  INR 1.77  Alkaline phosphatase, AST, ALT slightly increased.  Total bilirubin higher today, up to 11.4.  WBC 11.5. Hemoglobin improved 9.3.   Scheduled Meds: . azithromycin  500 mg Oral Q24H  . cefUROXime  500 mg Oral BID WC  . feeding supplement (ENSURE ENLIVE)  237 mL Oral TID BM  . finasteride  5 mg Oral QHS  . guaiFENesin  600 mg Oral BID  . hydrocortisone  25 mg Rectal BID  . insulin aspart  0-9 Units Subcutaneous TID WC  . insulin glargine  15 Units Subcutaneous QHS  . levothyroxine  200 mcg Oral q morning - 10a  . nortriptyline  10 mg Oral QHS  . pantoprazole  40 mg Oral Daily  . pregabalin  200 mg Oral BID  . sodium chloride flush  3 mL Intravenous Q12H  . sodium chloride flush  3 mL Intravenous Q12H   Continuous Infusions:  Principal Problem:   Jaundice Active Problems:   Atrial fibrillation, chronic (HCC)   Diabetes type 2, controlled (HCC)   Lobar pneumonia (HCC)   Elevated LFTs   Liver masses   Lung mass   Pancreatic mass   Anemia   LOS: 4 days

## 2016-03-12 NOTE — Progress Notes (Signed)
REVIEWED-NO ADDITIONAL RECOMMENDATIONS.  Spoke with Jannifer Franklin, PA, regarding need for liver biopsy. I have placed the order for tomorrow tentatively. She is aware of the INR today and will order appropriate labs for tomorrow. Hopefully, patient can have this done tomorrow.

## 2016-03-12 NOTE — Progress Notes (Signed)
    Subjective: Denies abdominal pain, confusion. States he would like to proceed with liver biopsy HERE.   Objective: Vital signs in last 24 hours: Temp:  [97.5 F (36.4 C)-99.1 F (37.3 C)] 97.5 F (36.4 C) (01/23 0556) Pulse Rate:  [60-64] 60 (01/23 0556) Resp:  [16-18] 18 (01/23 0556) BP: (103-129)/(64-65) 103/64 (01/23 0556) SpO2:  [98 %-99 %] 98 % (01/23 0556) Last BM Date: 03/10/16 General:   Alert and oriented, jaundice Eyes:  +scleral icterus  Abdomen:  Bowel sounds present, round but soft, noted HSM, specifically with spleen palpable LUQ, no rebound or guarding.  Extremities:  Without edema. Neurologic:  Alert and oriented to person, place, situation. Psych:  Alert and cooperative. Normal mood and affect.  Intake/Output from previous day: 01/22 0701 - 01/23 0700 In: -  Out: 550 [Urine:550] Intake/Output this shift: Total I/O In: 50 [P.O.:50] Out: -   Lab Results:  Recent Labs  03/10/16 0640  WBC 7.9  HGB 7.7*  HCT 24.4*  PLT 166   BMET  Recent Labs  03/10/16 0640 03/11/16 0634  NA 133* 136  K 4.9 4.6  CL 98* 101  CO2 24 25  GLUCOSE 145* 124*  BUN 31* 28*  CREATININE 0.86 0.85  CALCIUM 8.8* 8.9   LFT  Recent Labs  03/10/16 0640 03/11/16 0634  PROT 6.1* 6.2*  ALBUMIN 2.9* 2.9*  AST 128* 162*  ALT 77* 87*  ALKPHOS 312* 313*  BILITOT 5.7* 7.8*   PT/INR  Recent Labs  03/11/16 0634 03/12/16 0859  LABPROT 21.2* 20.8*  INR 1.81 1.77   Hepatitis Panel  Recent Labs  03/10/16 0640  HEPBSAG Negative  HCVAB <0.1  HEPAIGM Negative  HEPBIGM Negative    Assessment: 71 year old male admitted with pneumonia, sepsis, and found to have pancreatic mass and metastatic disease to liver without biliary obstruction (imaging reviewed by Dr. Oneida Alar with Dr. Barbie Banner). 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 INR slowly coming down, 1.7 today, likely multifactorial in setting of medication effect, nutritional  deficiency, hepatocellular disease. Liver biopsy can be performed if INR is less than 1.5. After discussion with patient, he now desires to have liver biopsy HERE and not at the [place]. On day 3 of 3 Vit K IV. Will discuss with Jannifer Franklin, PA, with IR and see if we can tentatively arrange a liver biopsy for 1/24 barring any unforeseen circumstances.   Elevated LFTs: in setting of metastatic disease. Recheck LFTs today. May eventually need endoscopic biliary intervention.  Acute hepatitis panel negative.  Anemia: No overt GI bleeding. Recheck CBC today in possible preparation for liver biopsy in next 24-48 hours. Multifactorial in setting of malignancy.   Plan: Repeat HFP and CBC today, orders placed for CMP, INR, CBC in am Will discuss with IR Jannifer Franklin, PA) scheduling for liver biopsy Day 3 of Vit K IV Appreciate oncology input Continue to hold Pradaxa Hope[facility] liver biopsy in next 24-48 hours with discharge home thereafter   Annitta Needs, ANP-BC [facility]      LOS: 4 days    03/12/2016, 10:06 AM  ADDENDUM at 1300: Repeat HFP reviewed. Bilirubin continues to climb, now 11.4. Direct and indirect also elevated at 6.1 and 5.3, respectively. Transaminases climbing. Known multiple hepatic lesions. CT reviewed by GI at time of admission and no evidence of biliary obstruction.  Annitta Needs, ANP-BC [facility]

## 2016-03-12 NOTE — Progress Notes (Signed)
Notified on call pt coughing. Requested cough medication. Awaiting response.

## 2016-03-13 LAB — COMPREHENSIVE METABOLIC PANEL
ALBUMIN: 3 g/dL — AB (ref 3.5–5.0)
ALK PHOS: 345 U/L — AB (ref 38–126)
ALT: 141 U/L — AB (ref 17–63)
AST: 276 U/L — AB (ref 15–41)
Anion gap: 13 (ref 5–15)
BILIRUBIN TOTAL: 13.6 mg/dL — AB (ref 0.3–1.2)
BUN: 42 mg/dL — AB (ref 6–20)
CALCIUM: 8.8 mg/dL — AB (ref 8.9–10.3)
CO2: 23 mmol/L (ref 22–32)
Chloride: 100 mmol/L — ABNORMAL LOW (ref 101–111)
Creatinine, Ser: 0.47 mg/dL — ABNORMAL LOW (ref 0.61–1.24)
GFR calc Af Amer: >60 mL/min (ref >60)
GFR calc non Af Amer: >60 mL/min (ref >60)
GLUCOSE: 152 mg/dL — AB (ref 65–99)
Potassium: 5.6 mmol/L — ABNORMAL HIGH (ref 3.5–5.1)
Sodium: 136 mmol/L (ref 135–145)
TOTAL PROTEIN: 6.4 g/dL — AB (ref 6.5–8.1)

## 2016-03-13 LAB — CBC
HCT: 30.7 % — ABNORMAL LOW (ref 39.0–52.0)
Hemoglobin: 9.5 g/dL — ABNORMAL LOW (ref 13.0–17.0)
MCH: 33.5 pg (ref 26.0–34.0)
MCHC: 30.9 g/dL (ref 30.0–36.0)
MCV: 108.1 fL — AB (ref 78.0–100.0)
Platelets: 198 10*3/uL (ref 150–400)
RBC: 2.84 MIL/uL — ABNORMAL LOW (ref 4.22–5.81)
RDW: 30.7 % — AB (ref 11.5–15.5)
WBC: 15.9 10*3/uL — ABNORMAL HIGH (ref 4.0–10.5)

## 2016-03-13 LAB — AMMONIA: Ammonia: 82 umol/L — ABNORMAL HIGH (ref 9–35)

## 2016-03-13 LAB — PROTIME-INR
INR: 1.7
Prothrombin Time: 20.2 seconds — ABNORMAL HIGH (ref 11.4–15.2)

## 2016-03-13 LAB — CANCER ANTIGEN 19-9

## 2016-03-13 LAB — GLUCOSE, CAPILLARY
GLUCOSE-CAPILLARY: 143 mg/dL — AB (ref 65–99)
GLUCOSE-CAPILLARY: 171 mg/dL — AB (ref 65–99)
Glucose-Capillary: 162 mg/dL — ABNORMAL HIGH (ref 65–99)
Glucose-Capillary: 172 mg/dL — ABNORMAL HIGH (ref 65–99)

## 2016-03-13 LAB — CEA: CEA: 1958 ng/mL — ABNORMAL HIGH (ref 0.0–4.7)

## 2016-03-13 MED ORDER — VITAMIN K1 10 MG/ML IJ SOLN
5.0000 mg | Freq: Once | INTRAVENOUS | Status: AC
  Administered 2016-03-13: 5 mg via INTRAVENOUS
  Filled 2016-03-13: qty 0.5

## 2016-03-13 NOTE — Progress Notes (Signed)
Dressing change to right fourth toe completed.

## 2016-03-13 NOTE — Progress Notes (Signed)
  Patient ID: Tony [name], male   DOB: June 03, 1945, 71 y.o.   MRN: [id number]   Scheduled for liver lesion biopsy today at [facility]  INR 1.7 still this am Even after Vit K dosing Pt has extensive liver involvement of lesions---possible INR may be resultant of this.  Discussed with Dr Vernard Gambles. Will check INR in am.  If INR stalling at 1.7 range---will consider FFP and even Trans jugular approach for this biopsy in next 24-48 hrs.  Neil Crouch PA aware and agreeable.

## 2016-03-13 NOTE — Plan of Care (Signed)
Problem: Physical Regulation: Goal: Will remain free from infection Outcome: Not Progressing See diagnosis and prognosis.  Problem: Fluid Volume: Goal: Ability to maintain a balanced intake and output will improve Outcome: Progressing Pt NPO after midnight.  Problem: Bowel/Gastric: Goal: Will not experience complications related to bowel motility Outcome: Progressing Pt had bowel movement this a.m.

## 2016-03-13 NOTE — Progress Notes (Signed)
Subjective:  Patient without complaints  Objective: Vital signs in last 24 hours: Temp:  [97.7 F (36.5 C)-98 F (36.7 C)] 97.7 F (36.5 C) (01/23 2049) Pulse Rate:  [60-61] 61 (01/24 0600) Resp:  [16-18] 16 (01/23 2049) BP: (113-131)/(59-74) 113/59 (01/24 0600) SpO2:  [90 %-100 %] 98 % (01/24 0600) Last BM Date: 03/13/16 General:   Alert,  pleasant and cooperative in NAD. jaundiced Head:  Normocephalic and atraumatic. Eyes:  Scleral icterus.  Chest: scattered rhonchi.    Heart:  Regular rate and rhythm; no murmurs, clicks, rubs,  or gallops. Abdomen:  Soft, protruberant but soft.  Normal bowel sounds, without guarding, and without rebound.   Extremities:  Without clubbing, deformity or edema. Neurologic:  Alert and  oriented x4;  grossly normal neurologically. Skin:  Intact without significant lesions or rashes. Psych:  Alert and cooperative. Normal mood and affect.  Intake/Output from previous day: 01/23 0701 - 01/24 0700 In: 410 [P.O.:410] Out: 200 [Urine:200] Intake/Output this shift: No intake/output data recorded.  Lab Results: CBC  Recent Labs  03/12/16 0448 03/13/16 0431  WBC 11.5* 15.9*  HGB 9.3* 9.5*  HCT 30.0* 30.7*  MCV 107.5* 108.1*  PLT 192 198   BMET  Recent Labs  03/11/16 0634 03/13/16 0431  NA 136 136  K 4.6 5.6*  CL 101 100*  CO2 25 23  GLUCOSE 124* 152*  BUN 28* 42*  CREATININE 0.85 0.47*  CALCIUM 8.9 8.8*   LFTs  Recent Labs  03/11/16 0634 03/12/16 0448 03/13/16 0431  BILITOT 7.8* 11.4* 13.6*  BILIDIR  --  6.1*  --   IBILI  --  5.3*  --   ALKPHOS 313* 327* 345*  AST 162* 217* 276*  ALT 87* 113* 141*  PROT 6.2* 6.6 6.4*  ALBUMIN 2.9* 3.2* 3.0*   No results for input(s): LIPASE in the last 72 hours. PT/INR  Recent Labs  03/11/16 0634 03/12/16 0859 03/13/16 0431  LABPROT 21.2* 20.8* 20.2*  INR 1.81 1.77 1.70      Imaging Studies: Dg Chest 2 View  Result Date: 03/08/2016 CLINICAL DATA:  71 year old male with  dyspnea on exertion. Cough and congestion for the past 2-3 days. EXAM: CHEST  2 VIEW COMPARISON:  Chest x-ray 06/02/2015. FINDINGS: Diffuse peribronchial cuffing. Ill-defined opacities in the region of the lingula and right middle lobe. No confluent consolidative airspace disease. No pleural effusions. No evidence of pulmonary edema. Heart size is normal. Atherosclerosis in the thoracic aorta. Upper mediastinal contours are within normal limits. Right-sided pacemaker device in place with lead tips projecting over the expected location of the right atrium and right ventricular apex. IMPRESSION: 1. Findings are compatible with bronchitis, potentially with multilobar bronchopneumonia involving the right middle lobe and lingula. Followup PA and lateral chest X-ray is recommended in 3-4 weeks following trial of antibiotic therapy to ensure resolution and exclude underlying malignancy. 2. Aortic atherosclerosis. Electronically Signed   By: Vinnie Langton M.D.   On: 03/08/2016 13:01   Ct Head W & Wo Contrast  Result Date: 03/12/2016 CLINICAL DATA:  Metastatic pancreatic carcinoma. Confusion. History of atrial fibrillation, diabetes. EXAM: CT HEAD WITHOUT AND WITH CONTRAST TECHNIQUE: Contiguous axial images were obtained from the base of the skull through the vertex without and with intravenous contrast CONTRAST:  5mL ISOVUE-300 IOPAMIDOL (ISOVUE-300) INJECTION 61% COMPARISON:  CT HEAD June 12, 2013 FINDINGS: BRAIN: The ventricles and sulci are normal for age. No intraparenchymal hemorrhage, mass effect nor midline shift. Patchy supratentorial white matter hypodensities less  than expected for patient's age, though non-specific are most compatible with chronic small vessel ischemic disease. No acute large vascular territory infarcts. No abnormal extra-axial fluid collections. Basal cisterns are patent. VASCULAR: Unremarkable. SKULL: No skull fracture. No significant scalp soft tissue swelling. SINUSES/ORBITS: The  mastoid air-cells and included paranasal sinuses are well-aerated.The included ocular globes and orbital contents are non-suspicious. OTHER: None. IMPRESSION: No intracranial metastasis. Normal CT HEAD with and without contrast for age. Electronically Signed   By: Elon Alas M.D.   On: 03/12/2016 20:12   Ct Chest W Contrast  Result Date: 03/09/2016 CLINICAL DATA:  Increased shortness of breath for several days. Elevated LFTs with multiple hepatic masses demonstrated on ultrasound. No known malignancy. EXAM: CT CHEST, ABDOMEN, AND PELVIS WITH CONTRAST TECHNIQUE: Multidetector CT imaging of the chest, abdomen and pelvis was performed following the standard protocol during bolus administration of intravenous contrast. CONTRAST:  120mL ISOVUE-300 IOPAMIDOL (ISOVUE-300) INJECTION 61% COMPARISON:  Right upper quadrant ultrasound earlier this day. Chest radiographs yesterday. Noncontrast abdominal CT 12/2015 15 FINDINGS: CT CHEST FINDINGS Cardiovascular: The heart is normal in size. Right-sided pacemaker in place. Thoracic aorta is normal in caliber with trace atherosclerosis of the arch. No filling defects in the central pulmonary arteries. Mediastinum/Nodes: Normal size subcarinal and pretracheal nodes. No pathologically enlarged mediastinal or hilar adenopathy. No axillary adenopathy. No pericardial fluid. Lungs/Pleura: Multifocal pulmonary nodules throughout all lobes of both lungs consistent with metastatic disease. The majority of these nodules are subcentimeter in size, however there is an ovoid 2.0 x 1.3 cm nodule in the perihilar left upper lobe series 3, image 76, with smooth margins. No confluent airspace disease. Mild central bronchial wall thickening. Trace right pleural thickening without frank pleural effusion. Musculoskeletal: Remote fracture of posterior right seventh rib without callus, possible nonunion. No blastic or evidence of destructive lytic lesions. CT ABDOMEN PELVIS FINDINGS  Hepatobiliary: Innumerable hypodense hepatic lesions throughout the liver which is enlarged consistent with metastatic disease. Index lesion in the subcapsular right lobe segment 5 measures 3.9 x 4.3 cm bi- axial measurements, appears confluent with adjacent lesions on coronal reformats. Clips in the gallbladder fossa postcholecystectomy. Pancreas: Ill-defined heterogeneous hypodense mass in the distal pancreatic body measures approximately 3.6 x 4.9 cm. Mild distal pancreatic ductal dilatation. This mass abuts the splenic artery. This is not about the superior mesenteric artery or vein. Spleen: Enlarged measuring 15.3 cm AP dimension, heterogeneous in density, without well-defined discrete lesion. The splenic vein is not visualized. Adrenals/Urinary Tract: No adrenal nodule. There are multiple bilateral water density lesions throughout both kidneys consistent with simple cysts. Additional small cortical hypodensities are too small to accurately characterize. No hydronephrosis. Symmetric excretion on delayed phase imaging. Stomach/Bowel: Small hiatal hernia. Stomach is physiologically distended. No bowel wall thickening, inflammation or obstruction. Sigmoid and descending colonic diverticulosis without acute diverticulitis. The appendix is not confidently identified. Vascular/Lymphatic: Paraesophageal retrocrural node measures 11 mm. Multiple small lymph nodes at the origin of the celiac and SMA. A 10 mm node is noted in the aortocaval region of the level of the mid kidneys. There are enlarged nodes in the porta hepatis. Mild aorta bi-iliac atherosclerosis without aneurysm. The splenic vein is not visualized in its normal location, collateral vessels in the splenic hilum with a tortuous collateral vein coursing anteriorly in the abdomen, joining abut the superior mesenteric vein just proximal to the portal confluence. Reproductive: Prostate is unremarkable. Other: Small amount of perihepatic ascites.  No free air.  Musculoskeletal: Sclerotic focus within the L1 vertebral  body is nonspecific, however appears unchanged from CT abdomen 01/04/2014 and is likely a bone island. No destructive lytic lesion. IMPRESSION: 1. Pancreatic mass measures 4.9 cm consistent with primary pancreatic malignancy, characteristics suggesting adenocarcinoma. This lesion abuts the splenic artery. 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 2. Diffuse hepatic metastatic disease. Small volume perihepatic ascites. 3. Diffuse pulmonary metastatic disease with multiple pulmonary nodules. 4. Metastatic upper abdominal adenopathy. Spleen appears enlarged and heterogeneous but no discrete lesion is seen. These results will be called to the ordering clinician or representative by the Radiologist Assistant, and communication documented in the PACS or zVision Dashboard. Electronically Signed   By: Jeb Levering M.D.   On: 03/09/2016 21:37   Ct Abdomen Pelvis W Contrast  Result Date: 03/09/2016 CLINICAL DATA:  Increased shortness of breath for several days. Elevated LFTs with multiple hepatic masses demonstrated on ultrasound. No known malignancy. EXAM: CT CHEST, ABDOMEN, AND PELVIS WITH CONTRAST TECHNIQUE: Multidetector CT imaging of the chest, abdomen and pelvis was performed following the standard protocol during bolus administration of intravenous contrast. CONTRAST:  156mL ISOVUE-300 IOPAMIDOL (ISOVUE-300) INJECTION 61% COMPARISON:  Right upper quadrant ultrasound earlier this day. Chest radiographs yesterday. Noncontrast abdominal CT 12/2015 15 FINDINGS: CT CHEST FINDINGS Cardiovascular: The heart is normal in size. Right-sided pacemaker in place. Thoracic aorta is normal in caliber with trace atherosclerosis of the arch. No filling defects in the central pulmonary arteries. Mediastinum/Nodes: Normal size subcarinal and pretracheal nodes. No pathologically enlarged mediastinal or hilar adenopathy. No axillary adenopathy. No pericardial fluid.  Lungs/Pleura: Multifocal pulmonary nodules throughout all lobes of both lungs consistent with metastatic disease. The majority of these nodules are subcentimeter in size, however there is an ovoid 2.0 x 1.3 cm nodule in the perihilar left upper lobe series 3, image 76, with smooth margins. No confluent airspace disease. Mild central bronchial wall thickening. Trace right pleural thickening without frank pleural effusion. Musculoskeletal: Remote fracture of posterior right seventh rib without callus, possible nonunion. No blastic or evidence of destructive lytic lesions. CT ABDOMEN PELVIS FINDINGS Hepatobiliary: Innumerable hypodense hepatic lesions throughout the liver which is enlarged consistent with metastatic disease. Index lesion in the subcapsular right lobe segment 5 measures 3.9 x 4.3 cm bi- axial measurements, appears confluent with adjacent lesions on coronal reformats. Clips in the gallbladder fossa postcholecystectomy. Pancreas: Ill-defined heterogeneous hypodense mass in the distal pancreatic body measures approximately 3.6 x 4.9 cm. Mild distal pancreatic ductal dilatation. This mass abuts the splenic artery. This is not about the superior mesenteric artery or vein. Spleen: Enlarged measuring 15.3 cm AP dimension, heterogeneous in density, without well-defined discrete lesion. The splenic vein is not visualized. Adrenals/Urinary Tract: No adrenal nodule. There are multiple bilateral water density lesions throughout both kidneys consistent with simple cysts. Additional small cortical hypodensities are too small to accurately characterize. No hydronephrosis. Symmetric excretion on delayed phase imaging. Stomach/Bowel: Small hiatal hernia. Stomach is physiologically distended. No bowel wall thickening, inflammation or obstruction. Sigmoid and descending colonic diverticulosis without acute diverticulitis. The appendix is not confidently identified. Vascular/Lymphatic: Paraesophageal retrocrural node  measures 11 mm. Multiple small lymph nodes at the origin of the celiac and SMA. A 10 mm node is noted in the aortocaval region of the level of the mid kidneys. There are enlarged nodes in the porta hepatis. Mild aorta bi-iliac atherosclerosis without aneurysm. The splenic vein is not visualized in its normal location, collateral vessels in the splenic hilum with a tortuous collateral vein coursing anteriorly in the abdomen, joining abut the  superior mesenteric vein just proximal to the portal confluence. Reproductive: Prostate is unremarkable. Other: Small amount of perihepatic ascites.  No free air. Musculoskeletal: Sclerotic focus within the L1 vertebral body is nonspecific, however appears unchanged from CT abdomen 01/04/2014 and is likely a bone island. No destructive lytic lesion. IMPRESSION: 1. Pancreatic mass measures 4.9 cm consistent with primary pancreatic malignancy, characteristics suggesting adenocarcinoma. This lesion abuts the splenic artery. 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 2. Diffuse hepatic metastatic disease. Small volume perihepatic ascites. 3. Diffuse pulmonary metastatic disease with multiple pulmonary nodules. 4. Metastatic upper abdominal adenopathy. Spleen appears enlarged and heterogeneous but no discrete lesion is seen. These results will be called to the ordering clinician or representative by the Radiologist Assistant, and communication documented in the PACS or zVision Dashboard. Electronically Signed   By: Jeb Levering M.D.   On: 03/09/2016 21:37   US Abdomen Limited Ruq  Result Date: 03/09/2016 CLINICAL DATA:  Elevated LFTs EXAM: US ABDOMEN LIMITED - RIGHT UPPER QUADRANT COMPARISON:  CT abdomen 01/04/2014 FINDINGS: Gallbladder: Prior cholecystectomy. Common bile duct: Diameter: 4.4 mm Liver: Heterogeneous echotexture with overall hepatic enlargement. Numerous hepatic masses throughout the liver with the largest in the right hepatic lobe measuring 3.9 x 4.1 x 5 cm.  IMPRESSION: 1. Enlarged liver with multiple hepatic masses most concerning for metastatic disease. Recommend further evaluation with CT or MRI of the abdomen. Electronically Signed   By: Kathreen Devoid   On: 03/09/2016 09:55  [2 weeks]   Assessment: 71 year old male admitted with pneumonia, sepsis, and found to have pancreatic mass and metastatic disease to liver without biliary obstruction (imaging reviewed by Dr. Oneida Alar with Dr. Barbie Banner). 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 INR slowly coming down, 1.7 today (unchanged in 24 hours), likely multifactorial in setting of medication effect, nutritional deficiency, hepatocellular disease. Liver biopsy on hold for today with hopes of further improvement in INR so liver bx can be done from percutaneous approach as opposed to transjugular. Discussed further with Jannifer Franklin, PA, with IR. Tentatively arrange a liver biopsy for 1/25.   Elevated LFTs: in setting of metastatic disease.  May eventually need endoscopic biliary intervention.  Acute hepatitis panel negative.  Anemia: Stable. No overt GI bleeding. Multifactorial in setting of malignancy.  Plan: Hopefully liver bx tomorrow. Discussed with Jannifer Franklin, PA with IR. Discussed with patient.   Laureen Ochs. Bernarda Caffey [facility] [phone] 1/24/20189:33 AM     LOS: 5 days

## 2016-03-13 NOTE — Progress Notes (Signed)
PROGRESS NOTE  Tony [name] [id number] DOB: [birth year] DOA: 03/08/2016 PCP: [place]  Brief Narrative: 71 year old man PMH diabetes mellitus presented with several day history of increasing shortness of breath and productive cough. Initial evaluation revealed multiple abnormalities including multifocal bronchopneumonia, elevated lactic acid, elevated LFTs modest hyperkalemia and anemia. Pneumonia quickly improved with empiric antibiotics but LFTs did not. Further imaging revealed pancreatic mass with substantial metastatic disease to the liver and lungs. Patient elected to proceed with biopsy here rather than [facility], tentatively planned for 1/25 as long as INR within acceptable range. Thereafter will follow up with oncology for further recommendations.  Assessment/Plan 1. 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  Patient and wife both desired to proceed with liver biopsy here (will be coordinated with [facility]). Continue vitamin K, repeat INR in the morning.  Oncology aware, following peripherally pending biopsy results. 2. 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 LFTs worse today. Defer management to gastroenterology. 3. Multilobar pneumonia with sepsis. Afebrile, no hypoxia. Appears resolved at this point.  Continue oral antibiotics 4. Elevated troponin. Troponins flat, EKG nonacute. No further evaluation suggested. No evidence of ACS. 5. Anemia of chronic disease/malignancy. Complicated by hemorrhoids. Improved today. 6. Atrial fibrillation, stable. Anticoagulation on hold until after biopsy. 7. Diabetes mellitus type 2. Blood sugars remain stable. 8. Confusion. Wife has wondered whether he might be developing dementia over the last several months. Appears to be stable. Serum ammonia several days ago was unremarkable.   Patient remains ill but not toxic. Pneumonia appears to be  resolving rapidly. Hopefully can obtain liver biopsy tomorrow. Unfortunately LFTs worsening. Prognosis poor.  Once biopsy obtained may be able to go home with close outpatient follow-up.  DVT prophylaxis: SCDs Code Status: full  Family Communication: wife at bedside Disposition Plan: home    Domingo Mend, MD [facility] Pager: [phone]  03/13/2016, 4:07 PM  LOS: 5 days   Consultants:    Procedures:    Antimicrobials:  Azithromycin 1/19 >> 1/23  Ceftin 1/21 >>  Ceftriaxone 1/19 >> 1/21  Interval history/Subjective: History vague, seems to be feeling okay. Wife at bedside feels like he has improved substantially. She notes some confusion. She is observed some memory loss over the last several months and was wondering whether he may be developing Alzheimer's dementia.  Objective: Vitals:   03/12/16 0556 03/12/16 1643 03/12/16 2049 03/13/16 0600  BP: 103/64 121/63 131/74 (!) 113/59  Pulse: 60 61 60 61  Resp: 18 18 16    Temp: 97.5 F (36.4 C) 98 F (36.7 C) 97.7 F (36.5 C)   TempSrc: Axillary Oral Oral Oral  SpO2: 98% 90% 100% 98%  Weight:      Height:        Intake/Output Summary (Last 24 hours) at 03/13/16 1607 Last data filed at 03/12/16 1700  Gross per 24 hour  Intake              360 ml  Output              200 ml  Net              160 ml     Filed Weights   03/08/16 1101 03/08/16 1605  Weight: 89.8 kg (198 lb) 94.9 kg (209 lb 4.8 oz)    Exam: Constitutional. Appears slightly confused, however, uncomfortable.  Respiratory. Clear to auscultation bilaterally. No wheezes, rales or rhonchi. Normal respiratory effort.  Cardiovascular. Regular rate and rhythm. No murmur, rub or gallop. No significant lower extremity edema.  Abdomen soft, nontender, nondistended.  Psychiatric. Mildly confused. Oriented to self, location. Not month or year.  I have personally reviewed following labs and imaging studies:  INR 1.77  Alkaline  phosphatase, AST, ALT slightly increased.  Total bilirubin higher today, up to 11.4.  WBC 11.5. Hemoglobin improved 9.3.   Scheduled Meds: . azithromycin  500 mg Oral Q24H  . cefUROXime  500 mg Oral BID WC  . feeding supplement (ENSURE ENLIVE)  237 mL Oral TID BM  . finasteride  5 mg Oral QHS  . guaiFENesin  600 mg Oral BID  . hydrocortisone  25 mg Rectal BID  . insulin aspart  0-9 Units Subcutaneous TID WC  . insulin glargine  15 Units Subcutaneous QHS  . levothyroxine  200 mcg Oral q morning - 10a  . nortriptyline  10 mg Oral QHS  . pantoprazole  40 mg Oral Daily  . pregabalin  200 mg Oral BID  . sodium chloride flush  3 mL Intravenous Q12H  . sodium chloride flush  3 mL Intravenous Q12H   Continuous Infusions:  Principal Problem:   Jaundice Active Problems:   Atrial fibrillation, chronic (HCC)   Diabetes type 2, controlled (HCC)   Lobar pneumonia (HCC)   Elevated LFTs   Liver masses   Lung mass   Pancreatic mass   Anemia   LOS: 5 days    Domingo Mend, MD [facility] Pager: [phone]

## 2016-03-14 LAB — COMPREHENSIVE METABOLIC PANEL
ALBUMIN: 2.8 g/dL — AB (ref 3.5–5.0)
ALK PHOS: 308 U/L — AB (ref 38–126)
ALT: 159 U/L — ABNORMAL HIGH (ref 17–63)
AST: 268 U/L — AB (ref 15–41)
Anion gap: 14 (ref 5–15)
BILIRUBIN TOTAL: 13.8 mg/dL — AB (ref 0.3–1.2)
BUN: 52 mg/dL — AB (ref 6–20)
CALCIUM: 8.7 mg/dL — AB (ref 8.9–10.3)
CO2: 23 mmol/L (ref 22–32)
CREATININE: 1.03 mg/dL (ref 0.61–1.24)
Chloride: 98 mmol/L — ABNORMAL LOW (ref 101–111)
GFR calc Af Amer: >60 mL/min (ref >60)
GFR calc non Af Amer: >60 mL/min (ref >60)
GLUCOSE: 158 mg/dL — AB (ref 65–99)
Potassium: 4.5 mmol/L (ref 3.5–5.1)
Sodium: 135 mmol/L (ref 135–145)
TOTAL PROTEIN: 5.9 g/dL — AB (ref 6.5–8.1)

## 2016-03-14 LAB — PROTIME-INR
INR: 1.83
Prothrombin Time: 21.4 seconds — ABNORMAL HIGH (ref 11.4–15.2)

## 2016-03-14 LAB — GLUCOSE, CAPILLARY
GLUCOSE-CAPILLARY: 223 mg/dL — AB (ref 65–99)
Glucose-Capillary: 130 mg/dL — ABNORMAL HIGH (ref 65–99)
Glucose-Capillary: 143 mg/dL — ABNORMAL HIGH (ref 65–99)
Glucose-Capillary: 198 mg/dL — ABNORMAL HIGH (ref 65–99)

## 2016-03-14 MED ORDER — SODIUM CHLORIDE 0.9 % IV SOLN
Freq: Once | INTRAVENOUS | Status: AC
  Administered 2016-03-14: 13:00:00 via INTRAVENOUS

## 2016-03-14 NOTE — Progress Notes (Signed)
FFP started at 23ml for first 15 min. Will increase after 93min.

## 2016-03-14 NOTE — Progress Notes (Signed)
Pt continues to try to get out of bed. No complaints of pain.

## 2016-03-14 NOTE — Progress Notes (Signed)
Tony [name] [id number] DOB: [birth year] DOA: 03/08/2016 PCP: [facility]  Brief Narrative: 71 year old man PMH diabetes mellitus presented with several day history of increasing shortness of breath and productive cough. Initial evaluation revealed multiple abnormalities including multifocal bronchopneumonia, elevated lactic acid, elevated LFTs modest hyperkalemia and anemia. Pneumonia quickly improved with empiric antibiotics but LFTs did not. Further imaging revealed pancreatic mass with substantial metastatic disease to the liver and lungs. Patient elected to proceed with biopsy here rather than [facility], tentatively planned for 1/26 as long as INR within acceptable range. Thereafter will follow up with oncology for further recommendations.  Assessment/Plan 1. 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  Patient and wife both desired to proceed with liver biopsy here (will be coordinated with [facility]).GI has written orders for FFP tonight in anticipation of lowering INR to acceptable range for biopsy in a.m. Oncology aware, following peripherally pending biopsy results. 2. 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 LFTs worse today. Defer management to gastroenterology. 3. Multilobar pneumonia with sepsis. Afebrile, no hypoxia. Appears resolved at this point.  Continue oral antibiotics 4. Elevated troponin. Troponins flat, EKG nonacute. No further evaluation suggested. No evidence of ACS. 5. Anemia of chronic disease/malignancy. Complicated by hemorrhoids. Improved today. 6. Atrial fibrillation, stable. Anticoagulation on hold until after biopsy. 7. Diabetes mellitus type 2. Blood sugars remain stable. 8. Confusion. Wife has wondered whether he might be developing dementia over the last several months. Appears to be stable. Serum ammonia several days ago was  unremarkable.   Patient remains ill but not toxic. Pneumonia appears to be resolving rapidly. Hopefully can obtain liver biopsy tomorrow. Unfortunately LFTs worsening. Prognosis poor.  Once biopsy obtained may be able to go home with close outpatient follow-up.  DVT prophylaxis: SCDs Code Status: full  Family Communication: wife at bedside Disposition Plan: home    Domingo Mend, MD [facility] Pager: [phone]  03/14/2016, 4:25 PM  LOS: 6 days   Consultants:    Procedures:    Antimicrobials:  Azithromycin 1/19 >> 1/23  Ceftin 1/21 >>  Ceftriaxone 1/19 >> 1/21  Interval history/Subjective: History vague, seems to be feeling okay. Wife at bedside feels like he has improved substantially. She notes some confusion. She is observed some memory loss over the last several months and was wondering whether he may be developing Alzheimer's dementia.  Objective: Vitals:   03/13/16 2059 03/14/16 0503 03/14/16 1100 03/14/16 1423  BP:  (!) 110/46 (!) 125/54 (!) 121/57  Pulse:  62 76 62  Resp: 18 18 17 18   Temp: 97.9 F (36.6 C) 97.3 F (36.3 C)  98.4 F (36.9 C)  TempSrc: Oral Oral  Oral  SpO2:  97% 100% 100%  Weight:      Height:        Intake/Output Summary (Last 24 hours) at 03/14/16 1625 Last data filed at 03/14/16 1300  Gross per 24 hour  Intake              293 ml  Output                0 ml  Net              293 ml     Filed Weights   03/08/16 1101 03/08/16 1605  Weight: 89.8 kg (198 lb) 94.9 kg (209 lb 4.8 oz)    Exam: Constitutional. Appears slightly confused, however, uncomfortable.  Respiratory.  Clear to auscultation bilaterally. No wheezes, rales or rhonchi. Normal respiratory effort.  Cardiovascular. Regular rate and rhythm. No murmur, rub or gallop. No significant lower extremity edema.  Abdomen soft, nontender, nondistended.  Psychiatric. Mildly confused. Oriented to self, location. Not month or year.  I have personally reviewed  following labs and imaging studies:  INR 1.77  Alkaline phosphatase, AST, ALT slightly increased.  Total bilirubin higher today, up to 11.4.  WBC 11.5. Hemoglobin improved 9.3.   Scheduled Meds: . azithromycin  500 mg Oral Q24H  . cefUROXime  500 mg Oral BID WC  . feeding supplement (ENSURE ENLIVE)  237 mL Oral TID BM  . finasteride  5 mg Oral QHS  . guaiFENesin  600 mg Oral BID  . hydrocortisone  25 mg Rectal BID  . insulin aspart  0-9 Units Subcutaneous TID WC  . insulin glargine  15 Units Subcutaneous QHS  . levothyroxine  200 mcg Oral q morning - 10a  . nortriptyline  10 mg Oral QHS  . pantoprazole  40 mg Oral Daily  . pregabalin  200 mg Oral BID  . sodium chloride flush  3 mL Intravenous Q12H  . sodium chloride flush  3 mL Intravenous Q12H   Continuous Infusions:  Principal Problem:   Jaundice Active Problems:   Atrial fibrillation, chronic (HCC)   Diabetes type 2, controlled (HCC)   Lobar pneumonia (HCC)   Elevated LFTs   Liver masses   Lung mass   Pancreatic mass   Anemia   LOS: 6 days    Domingo Mend, MD [facility] Pager: [phone]

## 2016-03-14 NOTE — Progress Notes (Signed)
Nutrition Follow-up  DOCUMENTATION CODES:  Not applicable  INTERVENTION:  Continue Ensure Enlive po TID, each supplement provides 350 kcal and 20 grams of protein  Magic Cup on [employer] as side option.   NUTRITION DIAGNOSIS:  Inadequate oral intake related to poor appetite, lethargy/confusion AEB eating <50% of meals for last 3 days  GOAL:  Patient will meet greater than or equal to 90% of their needs  MONITOR:  PO intake, Supplement acceptance, Diet advancement, Labs  ASSESSMENT:  71 y/o male PMHx DM, COPD, PTSD, A fib. Presented with several days SOB. Eval shoed multifocal PNA, elevated Lactic acid, elevated LFTs and modest hyperkalemia/anemia.  Ultrasound revealed suspected metastatic disease.  Interval history since 1/20: Patient's metastatic disease confirmed with disease. Plan for biopsy has been prolonged due to elevated INR. Intake remains Poor.   Pt NPO as of today. Prior to today he had bee consuming 0-10% of meals since 1/22 and typically <50% meals since admission. He has had only one meal intake >50%.   Today appears to be the best patient ate in a while. Per RN he consumed ~75% of his meal. Appears to have also had some Ensure with it.   The patient himself  was slightly absent minded; had to repeat questions and speak slowly for him to understand. He said he just wanted to eat whatever what "nutritionally the best". Sitter at bedside voiced that the pt's wife commented the pt needed softer items. The pt would not confirm this though.   Was able to find a few foods the patient was willing to accept. He likes the Ensures. Will continue these  NFPE: WDL  Admitted at 198 lbs??. His weight appears to  Fluctuate year to year. Does appear to have a slight downward trend, especially in the last 6 months. Not using outlying initial weight of 198 lbs, He has lost 20-25 lbs in the last 10 months. This is not a clinically significant loss for timeframe.   Labs: BG  125-170, WBC: 15.9-increasing, Albumin: 2.8-declining, Ammonia: 82 Medications: PO abx, insulin, ppi, Ensure ENlive   Recent Labs Lab 03/11/16 0634 03/13/16 0431 03/14/16 0555  NA 136 136 135  K 4.6 5.6* 4.5  CL 101 100* 98*  CO2 25 23 23   BUN 28* 42* 52*  CREATININE 0.85 0.47* 1.03  CALCIUM 8.9 8.8* 8.7*  GLUCOSE 124* 152* 158*   Diet Order:  Diet NPO time specified Except for: Sips with Meds  Skin: Non pressure wound to right ankle - full thickness, Cellulitis to R 4th toe and right foot  Last BM:  1/24  Height:  Ht Readings from Last 1 Encounters:  03/08/16 6' (1.829 m)   Weight:  Wt Readings from Last 1 Encounters:  03/08/16 209 lb 4.8 oz (94.9 kg)   Wt Readings from Last 10 Encounters:  03/08/16 209 lb 4.8 oz (94.9 kg)  11/15/15 214 lb 9.9 oz (97.3 kg)  06/02/15 232 lb (105.2 kg)  09/06/14 235 lb (106.6 kg)  08/23/14 239 lb 9.6 oz (108.7 kg)  05/28/14 228 lb (103.4 kg)  01/04/14 220 lb (99.8 kg)  12/12/12 221 lb 9 oz (100.5 kg)  05/12/12 252 lb 3.3 oz (114.4 kg)  04/03/12 252 lb 6.8 oz (114.5 kg)  Additionally wt measurement via Care Everywhere: Was 222 lbs in June.   Ideal Body Weight:  80.91 kg  BMI:  Body mass index is 28.39 kg/m.  Estimated Nutritional Needs:  Kcal:  2100-2300 (22-24 kcal/kg bw)  Protein:  97-113 g (1.2-1.4 g/kg IBW) Fluid:  2.1-2.3 Liters  EDUCATION NEEDS:  No education needs identified at this time  Burtis Junes RD, LDN, [facility] Pager: [phone] 03/14/2016 1:08 PM

## 2016-03-14 NOTE — Plan of Care (Signed)
Problem: Safety: Goal: Ability to remain free from injury will improve Outcome: Progressing Pt has safety sitter at this time

## 2016-03-14 NOTE — Progress Notes (Signed)
Spoke to patient's nurse, Mr. Jimmye Norman. FFP has not been started yet as ordered for 1800. He is aware of necessity to start FFP ASAP. 2 units. Each over 2-3 hours. Spoke with Nursing Supervisor as well who will assist in getting FFP started ASAP.

## 2016-03-14 NOTE — Progress Notes (Signed)
Subjective:  Patient has a Actuary with him. Restless night. Alert and oriented to person, place, time today. No complaints.   Objective: Vital signs in last 24 hours: Temp:  [97.3 F (36.3 C)-97.9 F (36.6 C)] 97.3 F (36.3 C) (01/25 0503) Pulse Rate:  [62-76] 76 (01/25 1100) Resp:  [17-18] 17 (01/25 1100) BP: (110-125)/(46-54) 125/54 (01/25 1100) SpO2:  [97 %-100 %] 100 % (01/25 1100) Last BM Date: 03/13/16 General:   Alert,  Well-developed, well-nourished, pleasant and cooperative in NAD Head:  Normocephalic and atraumatic. Eyes:  Scleral icterus.  Chest: scattered rhonchi.    Heart:  Regular rate and rhythm; no murmurs, clicks, rubs,  or gallops. Abdomen:  Soft, protruberant but soft. Normal bowel sounds, without guarding, and without rebound.   Extremities:  Without clubbing, deformity or edema. Neurologic:  Alert and  oriented x4;  grossly normal neurologically. No asterixis. Skin:  Intact without significant lesions or rashes. Psych:  Alert and cooperative. Normal mood and affect.  Intake/Output from previous day: 01/24 0701 - 01/25 0700 In: 173 [P.O.:120; I.V.:3; IV Piggyback:50] Out: -  Intake/Output this shift: No intake/output data recorded.  Lab Results: CBC  Recent Labs  03/12/16 0448 03/13/16 0431  WBC 11.5* 15.9*  HGB 9.3* 9.5*  HCT 30.0* 30.7*  MCV 107.5* 108.1*  PLT 192 198   BMET  Recent Labs  03/13/16 0431 03/14/16 0555  NA 136 135  K 5.6* 4.5  CL 100* 98*  CO2 23 23  GLUCOSE 152* 158*  BUN 42* 52*  CREATININE 0.47* 1.03  CALCIUM 8.8* 8.7*   LFTs  Recent Labs  03/12/16 0448 03/13/16 0431 03/14/16 0555  BILITOT 11.4* 13.6* 13.8*  BILIDIR 6.1*  --   --   IBILI 5.3*  --   --   ALKPHOS 327* 345* 308*  AST 217* 276* 268*  ALT 113* 141* 159*  PROT 6.6 6.4* 5.9*  ALBUMIN 3.2* 3.0* 2.8*   No results for input(s): LIPASE in the last 72 hours. PT/INR  Recent Labs  03/12/16 0859 03/13/16 0431 03/14/16 0555  LABPROT 20.8* 20.2*  21.4*  INR 1.77 1.70 1.83      Imaging Studies: Dg Chest 2 View  Result Date: 03/08/2016 CLINICAL DATA:  71 year old male with dyspnea on exertion. Cough and congestion for the past 2-3 days. EXAM: CHEST  2 VIEW COMPARISON:  Chest x-ray 06/02/2015. FINDINGS: Diffuse peribronchial cuffing. Ill-defined opacities in the region of the lingula and right middle lobe. No confluent consolidative airspace disease. No pleural effusions. No evidence of pulmonary edema. Heart size is normal. Atherosclerosis in the thoracic aorta. Upper mediastinal contours are within normal limits. Right-sided pacemaker device in place with lead tips projecting over the expected location of the right atrium and right ventricular apex. IMPRESSION: 1. Findings are compatible with bronchitis, potentially with multilobar bronchopneumonia involving the right middle lobe and lingula. Followup PA and lateral chest X-ray is recommended in 3-4 weeks following trial of antibiotic therapy to ensure resolution and exclude underlying malignancy. 2. Aortic atherosclerosis. Electronically Signed   By: Vinnie Langton M.D.   On: 03/08/2016 13:01   Ct Head W & Wo Contrast  Result Date: 03/12/2016 CLINICAL DATA:  Metastatic pancreatic carcinoma. Confusion. History of atrial fibrillation, diabetes. EXAM: CT HEAD WITHOUT AND WITH CONTRAST TECHNIQUE: Contiguous axial images were obtained from the base of the skull through the vertex without and with intravenous contrast CONTRAST:  58mL ISOVUE-300 IOPAMIDOL (ISOVUE-300) INJECTION 61% COMPARISON:  CT HEAD June 12, 2013 FINDINGS: BRAIN: The  ventricles and sulci are normal for age. No intraparenchymal hemorrhage, mass effect nor midline shift. Patchy supratentorial white matter hypodensities less than expected for patient's age, though non-specific are most compatible with chronic small vessel ischemic disease. No acute large vascular territory infarcts. No abnormal extra-axial fluid collections. Basal  cisterns are patent. VASCULAR: Unremarkable. SKULL: No skull fracture. No significant scalp soft tissue swelling. SINUSES/ORBITS: The mastoid air-cells and included paranasal sinuses are well-aerated.The included ocular globes and orbital contents are non-suspicious. OTHER: None. IMPRESSION: No intracranial metastasis. Normal CT HEAD with and without contrast for age. Electronically Signed   By: Elon Alas M.D.   On: 03/12/2016 20:12   Ct Chest W Contrast  Result Date: 03/09/2016 CLINICAL DATA:  Increased shortness of breath for several days. Elevated LFTs with multiple hepatic masses demonstrated on ultrasound. No known malignancy. EXAM: CT CHEST, ABDOMEN, AND PELVIS WITH CONTRAST TECHNIQUE: Multidetector CT imaging of the chest, abdomen and pelvis was performed following the standard protocol during bolus administration of intravenous contrast. CONTRAST:  126mL ISOVUE-300 IOPAMIDOL (ISOVUE-300) INJECTION 61% COMPARISON:  Right upper quadrant ultrasound earlier this day. Chest radiographs yesterday. Noncontrast abdominal CT 12/2015 15 FINDINGS: CT CHEST FINDINGS Cardiovascular: The heart is normal in size. Right-sided pacemaker in place. Thoracic aorta is normal in caliber with trace atherosclerosis of the arch. No filling defects in the central pulmonary arteries. Mediastinum/Nodes: Normal size subcarinal and pretracheal nodes. No pathologically enlarged mediastinal or hilar adenopathy. No axillary adenopathy. No pericardial fluid. Lungs/Pleura: Multifocal pulmonary nodules throughout all lobes of both lungs consistent with metastatic disease. The majority of these nodules are subcentimeter in size, however there is an ovoid 2.0 x 1.3 cm nodule in the perihilar left upper lobe series 3, image 76, with smooth margins. No confluent airspace disease. Mild central bronchial wall thickening. Trace right pleural thickening without frank pleural effusion. Musculoskeletal: Remote fracture of posterior right  seventh rib without callus, possible nonunion. No blastic or evidence of destructive lytic lesions. CT ABDOMEN PELVIS FINDINGS Hepatobiliary: Innumerable hypodense hepatic lesions throughout the liver which is enlarged consistent with metastatic disease. Index lesion in the subcapsular right lobe segment 5 measures 3.9 x 4.3 cm bi- axial measurements, appears confluent with adjacent lesions on coronal reformats. Clips in the gallbladder fossa postcholecystectomy. Pancreas: Ill-defined heterogeneous hypodense mass in the distal pancreatic body measures approximately 3.6 x 4.9 cm. Mild distal pancreatic ductal dilatation. This mass abuts the splenic artery. This is not about the superior mesenteric artery or vein. Spleen: Enlarged measuring 15.3 cm AP dimension, heterogeneous in density, without well-defined discrete lesion. The splenic vein is not visualized. Adrenals/Urinary Tract: No adrenal nodule. There are multiple bilateral water density lesions throughout both kidneys consistent with simple cysts. Additional small cortical hypodensities are too small to accurately characterize. No hydronephrosis. Symmetric excretion on delayed phase imaging. Stomach/Bowel: Small hiatal hernia. Stomach is physiologically distended. No bowel wall thickening, inflammation or obstruction. Sigmoid and descending colonic diverticulosis without acute diverticulitis. The appendix is not confidently identified. Vascular/Lymphatic: Paraesophageal retrocrural node measures 11 mm. Multiple small lymph nodes at the origin of the celiac and SMA. A 10 mm node is noted in the aortocaval region of the level of the mid kidneys. There are enlarged nodes in the porta hepatis. Mild aorta bi-iliac atherosclerosis without aneurysm. The splenic vein is not visualized in its normal location, collateral vessels in the splenic hilum with a tortuous collateral vein coursing anteriorly in the abdomen, joining abut the superior mesenteric vein just  proximal to the portal confluence.  Reproductive: Prostate is unremarkable. Other: Small amount of perihepatic ascites.  No free air. Musculoskeletal: Sclerotic focus within the L1 vertebral body is nonspecific, however appears unchanged from CT abdomen 01/04/2014 and is likely a bone island. No destructive lytic lesion. IMPRESSION: 1. Pancreatic mass measures 4.9 cm consistent with primary pancreatic malignancy, characteristics suggesting adenocarcinoma. This lesion abuts the splenic artery. 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 2. Diffuse hepatic metastatic disease. Small volume perihepatic ascites. 3. Diffuse pulmonary metastatic disease with multiple pulmonary nodules. 4. Metastatic upper abdominal adenopathy. Spleen appears enlarged and heterogeneous but no discrete lesion is seen. These results will be called to the ordering clinician or representative by the Radiologist Assistant, and communication documented in the PACS or zVision Dashboard. Electronically Signed   By: Jeb Levering M.D.   On: 03/09/2016 21:37   Ct Abdomen Pelvis W Contrast  Result Date: 03/09/2016 CLINICAL DATA:  Increased shortness of breath for several days. Elevated LFTs with multiple hepatic masses demonstrated on ultrasound. No known malignancy. EXAM: CT CHEST, ABDOMEN, AND PELVIS WITH CONTRAST TECHNIQUE: Multidetector CT imaging of the chest, abdomen and pelvis was performed following the standard protocol during bolus administration of intravenous contrast. CONTRAST:  179mL ISOVUE-300 IOPAMIDOL (ISOVUE-300) INJECTION 61% COMPARISON:  Right upper quadrant ultrasound earlier this day. Chest radiographs yesterday. Noncontrast abdominal CT 12/2015 15 FINDINGS: CT CHEST FINDINGS Cardiovascular: The heart is normal in size. Right-sided pacemaker in place. Thoracic aorta is normal in caliber with trace atherosclerosis of the arch. No filling defects in the central pulmonary arteries. Mediastinum/Nodes: Normal size subcarinal and  pretracheal nodes. No pathologically enlarged mediastinal or hilar adenopathy. No axillary adenopathy. No pericardial fluid. Lungs/Pleura: Multifocal pulmonary nodules throughout all lobes of both lungs consistent with metastatic disease. The majority of these nodules are subcentimeter in size, however there is an ovoid 2.0 x 1.3 cm nodule in the perihilar left upper lobe series 3, image 76, with smooth margins. No confluent airspace disease. Mild central bronchial wall thickening. Trace right pleural thickening without frank pleural effusion. Musculoskeletal: Remote fracture of posterior right seventh rib without callus, possible nonunion. No blastic or evidence of destructive lytic lesions. CT ABDOMEN PELVIS FINDINGS Hepatobiliary: Innumerable hypodense hepatic lesions throughout the liver which is enlarged consistent with metastatic disease. Index lesion in the subcapsular right lobe segment 5 measures 3.9 x 4.3 cm bi- axial measurements, appears confluent with adjacent lesions on coronal reformats. Clips in the gallbladder fossa postcholecystectomy. Pancreas: Ill-defined heterogeneous hypodense mass in the distal pancreatic body measures approximately 3.6 x 4.9 cm. Mild distal pancreatic ductal dilatation. This mass abuts the splenic artery. This is not about the superior mesenteric artery or vein. Spleen: Enlarged measuring 15.3 cm AP dimension, heterogeneous in density, without well-defined discrete lesion. The splenic vein is not visualized. Adrenals/Urinary Tract: No adrenal nodule. There are multiple bilateral water density lesions throughout both kidneys consistent with simple cysts. Additional small cortical hypodensities are too small to accurately characterize. No hydronephrosis. Symmetric excretion on delayed phase imaging. Stomach/Bowel: Small hiatal hernia. Stomach is physiologically distended. No bowel wall thickening, inflammation or obstruction. Sigmoid and descending colonic diverticulosis  without acute diverticulitis. The appendix is not confidently identified. Vascular/Lymphatic: Paraesophageal retrocrural node measures 11 mm. Multiple small lymph nodes at the origin of the celiac and SMA. A 10 mm node is noted in the aortocaval region of the level of the mid kidneys. There are enlarged nodes in the porta hepatis. Mild aorta bi-iliac atherosclerosis without aneurysm. The splenic vein is not visualized in its  normal location, collateral vessels in the splenic hilum with a tortuous collateral vein coursing anteriorly in the abdomen, joining abut the superior mesenteric vein just proximal to the portal confluence. Reproductive: Prostate is unremarkable. Other: Small amount of perihepatic ascites.  No free air. Musculoskeletal: Sclerotic focus within the L1 vertebral body is nonspecific, however appears unchanged from CT abdomen 01/04/2014 and is likely a bone island. No destructive lytic lesion. IMPRESSION: 1. Pancreatic mass measures 4.9 cm consistent with primary pancreatic malignancy, characteristics suggesting adenocarcinoma. This lesion abuts the splenic artery. 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 2. Diffuse hepatic metastatic disease. Small volume perihepatic ascites. 3. Diffuse pulmonary metastatic disease with multiple pulmonary nodules. 4. Metastatic upper abdominal adenopathy. Spleen appears enlarged and heterogeneous but no discrete lesion is seen. These results will be called to the ordering clinician or representative by the Radiologist Assistant, and communication documented in the PACS or zVision Dashboard. Electronically Signed   By: Jeb Levering M.D.   On: 03/09/2016 21:37   US Abdomen Limited Ruq  Result Date: 03/09/2016 CLINICAL DATA:  Elevated LFTs EXAM: US ABDOMEN LIMITED - RIGHT UPPER QUADRANT COMPARISON:  CT abdomen 01/04/2014 FINDINGS: Gallbladder: Prior cholecystectomy. Common bile duct: Diameter: 4.4 mm Liver: Heterogeneous echotexture with overall hepatic  enlargement. Numerous hepatic masses throughout the liver with the largest in the right hepatic lobe measuring 3.9 x 4.1 x 5 cm. IMPRESSION: 1. Enlarged liver with multiple hepatic masses most concerning for metastatic disease. Recommend further evaluation with CT or MRI of the abdomen. Electronically Signed   By: Kathreen Devoid   On: 03/09/2016 09:55  [2 weeks]   Assessment:  71 year old male admitted with pneumonia, sepsis, and found to have pancreatic mass and metastatic disease to liver without biliary obstruction (imaging reviewed by Dr. Oneida Alar with Dr. Barbie Banner). 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 INR slow to come down,likely multifactorial in setting of medication effect, nutritional deficiency, hepatocellular disease. Liver biopsy on hold as preferred approach would be guided percutaneous liver biopsy as opposed to transjugular (blinded approach). Patient has received total of 35mg  vit K IV.    Elevated LFTs: in setting of metastatic disease.  May eventually need endoscopic biliary intervention. Acute hepatitis panel negative.  Anemia: Stable. No overt GI bleeding. Multifactorial in setting of malignancy.  Plan: 1. I have discussed case with IR, Jannifer Franklin, PA-C, Dr. Gala Romney, Dr. [name]. We will plan to give 2 units of FFP late today/this evening. Recheck INR in AM for possible percutaneous liver biopsy.   Laureen Ochs. Bernarda Caffey [facility] [phone] 1/25/201812:08 PM     LOS: 6 days    Discussed plan for FFP with Caryl Pina, RN. Do not start before 1800. Needs to be transfused by 2359.   Laureen Ochs. Bernarda Caffey [facility] [phone] 1/25/20181:08 PM

## 2016-03-14 NOTE — Evaluation (Signed)
Physical Therapy Evaluation Patient Details Name: Tony [name] MRN: [id number] DOB: 11-11-1945 Today's Date: 03/14/2016   History of Present Illness  71 year old man PMH diabetes mellitus presented with several day history of increasing shortness of breath and productive cough. Initial evaluation revealed multiple abnormalities including multifocal bronchopneumonia, elevated lactic acid, elevated LFTs modest hyperkalemia and anemia. Pneumonia quickly improved with empiric antibiotics but LFTs did not. Further imaging revealed pancreatic mass with substantial metastatic disease to the liver and lungs.  Clinical Impression  Pt received sitting on the EOB, and was agreeable to PT evaluation.  Pt expressed that he mainly uses his quad cane for ambulation in the house, and his electric scooter when he is out in the community.  Pt is normally independent with dressing, bathing, and driving.  Today's evaluation is greatly limited due to pt's near continuous coughing, however, spoke with pt's nurse, Caryl Pina, who has given him everything he can have at this time.  Pt requires increased time to answer questions and needs many questions repeated due to forgetting what was asked in the first place.  Pt required Min A for transfer bed<>chair with quad cane.  Further gait deferred due to continuous coughing.  Sitter states that pt has been able to ambulate to the bathroom with the nurse tech, however he becomes incontinent on the way there.  Pt states that his wife is at home with him all the time and can provide 24/7 supervision/assistance. Will need to confirm this.  Recommend that pt d/c home with HHPT, and 24/7 supervision/assistance.  Also recommend OT consult due to difficulty with ADL's.     Follow Up Recommendations Home health PT;Supervision/Assistance - 24 hour    Equipment Recommendations  None recommended by PT    Recommendations for Other Services OT consult     Precautions / Restrictions  Precautions Precautions: Fall Precaution Comments: Pt reports that he has fallen 2-3 times in the past 6 months.  Restrictions Weight Bearing Restrictions: No      Mobility  Bed Mobility Overal bed mobility:  (Not observed, pt already sitting on the EOB upon entering the room. )                Transfers Overall transfer level: Needs assistance Equipment used: Quad cane Transfers: Sit to/from [employer] to Stand: Min assist Stand pivot transfers: Min assist       General transfer comment: pt is not able to come into a full upright standing position and is very flexed forward when standing.  Pt was able to transfer from the bed<>chair.  Further mobility deferred due to near continuous coughing.   Safety sitter reports that the techs have been able to ambulate with him into the bathroom with the cane, but he has difficulty holding his urine while ambulating there.   Ambulation/Gait                Stairs            Wheelchair Mobility    Modified Rankin (Stroke Patients Only)       Balance Overall balance assessment: History of Falls;Needs assistance Sitting-balance support: Single extremity supported Sitting balance-Leahy Scale: Good     Standing balance support: Single extremity supported Standing balance-Leahy Scale: Poor                               Pertinent Vitals/Pain Pain Assessment: No/denies pain    Home  Living   Living Arrangements: Spouse/significant other Available Help at Discharge: Available 24 hours/day         Home Layout: One level Home Equipment: Cane - quad;Electric scooter      Prior Function Level of Independence: Independent with assistive device(s)   Gait / Transfers Assistance Needed: Pt states he mostly uses the electric scooter, and then very short household distances with the quad cane  ADL's / Homemaking Assistance Needed: independent with dressing, and bathing, pt is still  driving, Pt uses electric scooter for community distances.         Hand Dominance   Dominant Hand: Left    Extremity/Trunk Assessment   Upper Extremity Assessment Upper Extremity Assessment: Generalized weakness    Lower Extremity Assessment Lower Extremity Assessment: Generalized weakness (B knees lacking full extension)       Communication   Communication: No difficulties  Cognition Arousal/Alertness: Awake/alert Behavior During Therapy: WFL for tasks assessed/performed Overall Cognitive Status: Impaired/Different from baseline Area of Impairment: Orientation Orientation Level: Time (May)             General Comments: Pt is slow to respond, and forgets the question many times.      General Comments      Exercises     Assessment/Plan    PT Assessment Patient needs continued PT services  PT Problem List Decreased strength;Decreased activity tolerance;Decreased balance;Decreased mobility;Decreased cognition;Decreased safety awareness;Decreased knowledge of precautions          PT Treatment Interventions DME instruction;Gait training;Functional mobility training;Therapeutic activities;Therapeutic exercise;Balance training;Cognitive remediation;Patient/family education    PT Goals (Current goals can be found in the Care Plan section)  Acute Rehab PT Goals PT Goal Formulation: Patient unable to participate in goal setting Time For Goal Achievement: 03/28/16 Potential to Achieve Goals: Poor    Frequency Min 3X/week   Barriers to discharge        Co-evaluation               End of Session Equipment Utilized During Treatment: Gait belt Activity Tolerance: Patient limited by fatigue Patient left: in chair;with call bell/phone within reach;with nursing/sitter in room      Functional Assessment Tool Used: [employer] "6-clicks"  Functional Limitation: Mobility: Walking and moving around Mobility: Walking and Moving Around Current  Status [phone]): At least 40 percent but less than 60 percent impaired, limited or restricted Mobility: Walking and Moving Around Goal Status [phone]): At least 20 percent but less than 40 percent impaired, limited or restricted    Time: 1526-1559 PT Time Calculation (min) (ACUTE ONLY): 33 min   Charges:   PT Evaluation $PT Eval Low Complexity: 1 Procedure PT Treatments $Therapeutic Activity: 8-22 mins   PT G Codes:   PT G-Codes **NOT FOR INPATIENT CLASS** Functional Assessment Tool Used: [employer] "6-clicks"  Functional Limitation: Mobility: Walking and moving around Mobility: Walking and Moving Around Current Status [phone]): At least 40 percent but less than 60 percent impaired, limited or restricted Mobility: Walking and Moving Around Goal Status [phone]): At least 20 percent but less than 40 percent impaired, limited or restricted    Ladoris Gene 03/14/2016, 4:23 PM

## 2016-03-14 NOTE — Progress Notes (Signed)
Patient ID: Tony [name], male   DOB: 1945-09-20, 71 y.o.   MRN: [id number]   Pt has been on and off schedule for liver lesion biopsy. watching INR  INR 1.83 today (1.7 1/24)  Discussed with Dr Kathlene Cote Will not perform biopsy at this level He does not feel Trans Jugular approach would be appropriate Could potentially miss lesion.  Rec: FFP Recheck INR in am May consider percutaneous biopsy if INR 1.5 or lower  I have spoken to Neil Crouch PA She will discuss with her MD  Let me know what they feel appropriate for timing/planning

## 2016-03-15 DIAGNOSIS — J189 Pneumonia, unspecified organism: Secondary | ICD-10-CM

## 2016-03-15 DIAGNOSIS — R918 Other nonspecific abnormal finding of lung field: Secondary | ICD-10-CM

## 2016-03-15 DIAGNOSIS — C801 Malignant (primary) neoplasm, unspecified: Secondary | ICD-10-CM

## 2016-03-15 DIAGNOSIS — I482 Chronic atrial fibrillation: Secondary | ICD-10-CM

## 2016-03-15 LAB — PROTIME-INR
INR: 1.82
Prothrombin Time: 21.3 seconds — ABNORMAL HIGH (ref 11.4–15.2)

## 2016-03-15 LAB — PREPARE FRESH FROZEN PLASMA
BLOOD PRODUCT EXPIRATION DATE: 201801302359
Blood Product Expiration Date: 201801302359
ISSUE DATE / TIME: 201801252340
ISSUE DATE / TIME: 201801252054
UNIT TYPE AND RH: 6200
Unit Type and Rh: 6200

## 2016-03-15 LAB — GLUCOSE, CAPILLARY
Glucose-Capillary: 170 mg/dL — ABNORMAL HIGH (ref 65–99)
Glucose-Capillary: 155 mg/dL — ABNORMAL HIGH (ref 65–99)
Glucose-Capillary: 163 mg/dL — ABNORMAL HIGH (ref 65–99)

## 2016-03-15 MED ORDER — CEFUROXIME AXETIL 500 MG PO TABS: 500.0000 mg | ORAL_TABLET | Freq: Two times a day (BID) | ORAL | 0 refills | Status: AC

## 2016-03-15 MED ORDER — HALOPERIDOL LACTATE 5 MG/ML IJ SOLN
2.0000 mg | Freq: Once | INTRAMUSCULAR | Status: AC
  Administered 2016-03-16: 2 mg via INTRAVENOUS
  Filled 2016-03-15: qty 1

## 2016-03-15 NOTE — Progress Notes (Signed)
FFP to run 2-3hrs per order.

## 2016-03-15 NOTE — Progress Notes (Signed)
Called the patient's wife again and she angrily yelled that she was not coming to get her husband and to transfer him to the [place] and hung up the phone.

## 2016-03-15 NOTE — Discharge Summary (Signed)
Physician Discharge Summary  Tony [name] [id number] DOB: 05-15-1945 DOA: 03/08/2016  PCP: [facility] date: 03/08/2016 Discharge date: 03/15/2016  Time spent: 45 minutes  Recommendations for Outpatient Follow-up:  -Will be discharged home today. -[place] services have been arranged.  -GI will arrange OP referral to [facility] for IR and oncology.  Discharge Diagnoses:  Principal Problem:   Jaundice Active Problems:   Atrial fibrillation, chronic (HCC)   Diabetes type 2, controlled (HCC)   Lobar pneumonia (HCC)   Elevated LFTs   Liver masses   Lung mass   Mass of pancreas   Anemia   Malignancy [facility])   Discharge Condition: Stable  Filed Weights   03/08/16 1101 03/08/16 1605  Weight: 89.8 kg (198 lb) 94.9 kg (209 lb 4.8 oz)    History of present illness:  As per Dr. Sarajane Jews on 1/19: 71 year old man PMH diabetes mellitus presented with several day history of increasing shortness of breath and productive cough. Initial evaluation revealed multiple abnormalities including multifocal bronchopneumonia, elevated lactic acid, elevated LFTs modest hyperkalemia and anemia.  Patient reports has been doing fairly well, over the last 48 hours he developed increasing shortness of breath with associated productive cough, worse with exertion, better with rest. He's also has some lower chest pain from coughing with some radiation under the right rib cage and to the epigastrium. He denies any liver problems, no alcohol use, no drug use.  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 This has been healing well with home health wound care.  ED Course: Afebrile, vitals stable. Treated with albuterol, Zithromax, ceftriaxone, methylprednisolone, 2 L normal saline  Hospital Course:   1. 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  Patient and wife both desired to proceed with liver biopsy here (will be  coordinated with [facility]). Biopsy not accomplished due to elevated INR despite Vit K and FFP. GI will arrange for OP follow up at [facility] for these purposes. 2. 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  Defer management to gastroenterology. 3. Multilobar pneumonia with sepsis. Afebrile, no hypoxia. Appears resolved at this point.  Continue oral antibiotics for 5 more days on DC. 4. Elevated troponin. Troponins flat, EKG nonacute. No further evaluation suggested. No evidence of ACS. 5. Anemia of chronic disease/malignancy. Complicated by hemorrhoids. Improved today. 6. Atrial fibrillation, stable. Anticoagulation on hold until after biopsy. 7. Diabetes mellitus type 2. Blood sugars remain stable. 8. Confusion. Wife has wondered whether he might be developing dementia over the last several months. Appears to be stable. Serum ammonia several days ago was unremarkable.  Procedures:  None   Consultations:  GI  Discharge Instructions  Discharge Instructions    Diet - low sodium heart healthy    Complete by:  As directed    Increase activity slowly    Complete by:  As directed      Allergies as of 03/15/2016   No Known Allergies     Medication List    STOP taking these medications   acetaminophen 325 MG tablet Commonly known as:  TYLENOL   atorvastatin 80 MG tablet Commonly known as:  LIPITOR   dabigatran 150 MG Caps capsule Commonly known as:  PRADAXA   diphenhydramine-acetaminophen 25-500 MG Tabs tablet Commonly known as:  TYLENOL PM     TAKE these medications   albuterol 108 (90 Base) MCG/ACT inhaler Commonly known as:  PROVENTIL  HFA;VENTOLIN HFA Inhale 2 puffs into the lungs every 6 (six) hours as needed. Shortness of breath   cefUROXime 500 MG tablet Commonly known as:  CEFTIN Take 1 tablet (500 mg total) by mouth 2 (two) times daily with a meal. Start taking on:  03/16/2016   finasteride 5 MG tablet Commonly  known as:  PROSCAR Take 5 mg by mouth at bedtime.   fluticasone 50 MCG/ACT nasal spray Commonly known as:  FLONASE Place 1 spray into both nostrils daily as needed for allergies or rhinitis.   glipiZIDE 10 MG tablet Commonly known as:  GLUCOTROL Take 20 mg by mouth daily as needed (if BG >150).   insulin glargine 100 UNIT/ML injection Commonly known as:  LANTUS Inject 15 Units into the skin as needed. If BG > 200, inject 15 units If BG > 225, inject 20 units   lactulose 10 GM/15ML solution Commonly known as:  CHRONULAC Take 10 g by mouth daily as needed for mild constipation.   levothyroxine 100 MCG tablet Commonly known as:  SYNTHROID, LEVOTHROID Take 200 mcg by mouth every morning.   mometasone 220 MCG/INH inhaler Commonly known as:  ASMANEX Inhale 2 puffs into the lungs 2 (two) times daily. May use at home medication   nortriptyline 10 MG capsule Commonly known as:  PAMELOR Take 10 mg by mouth at bedtime.   omeprazole 20 MG capsule Commonly known as:  PRILOSEC Take 20 mg by mouth at bedtime.   oxyCODONE 5 MG immediate release tablet Commonly known as:  Oxy IR/ROXICODONE Take 5 mg by mouth 3 (three) times daily as needed for moderate pain.   pregabalin 100 MG capsule Commonly known as:  LYRICA Take 200 mg by mouth 2 (two) times daily.   traZODone 50 MG tablet Commonly known as:  DESYREL Take 50 mg by mouth at bedtime as needed for sleep.      No Known Allergies Follow-up Information    [facility] Follow up.   Why:  Dr. Oneida Alar' office will let you know about appointment date and time at [facility].           The results of significant diagnostics from this hospitalization (including imaging, microbiology, ancillary and laboratory) are listed below for reference.    Significant Diagnostic Studies: Dg Chest 2 View  Result Date: 03/08/2016 CLINICAL DATA:  72 year old male with dyspnea on exertion. Cough and congestion for the past 2-3 days. EXAM: CHEST  2 VIEW  COMPARISON:  Chest x-ray 06/02/2015. FINDINGS: Diffuse peribronchial cuffing. Ill-defined opacities in the region of the lingula and right middle lobe. No confluent consolidative airspace disease. No pleural effusions. No evidence of pulmonary edema. Heart size is normal. Atherosclerosis in the thoracic aorta. Upper mediastinal contours are within normal limits. Right-sided pacemaker device in place with lead tips projecting over the expected location of the right atrium and right ventricular apex. IMPRESSION: 1. Findings are compatible with bronchitis, potentially with multilobar bronchopneumonia involving the right middle lobe and lingula. Followup PA and lateral chest X-ray is recommended in 3-4 weeks following trial of antibiotic therapy to ensure resolution and exclude underlying malignancy. 2. Aortic atherosclerosis. Electronically Signed   By: Vinnie Langton M.D.   On: 03/08/2016 13:01   Ct Head W & Wo Contrast  Result Date: 03/12/2016 CLINICAL DATA:  Metastatic pancreatic carcinoma. Confusion. History of atrial fibrillation, diabetes. EXAM: CT HEAD WITHOUT AND WITH CONTRAST TECHNIQUE: Contiguous axial images were obtained from the base of the skull through the vertex without and with intravenous  contrast CONTRAST:  25mL ISOVUE-300 IOPAMIDOL (ISOVUE-300) INJECTION 61% COMPARISON:  CT HEAD June 12, 2013 FINDINGS: BRAIN: The ventricles and sulci are normal for age. No intraparenchymal hemorrhage, mass effect nor midline shift. Patchy supratentorial white matter hypodensities less than expected for patient's age, though non-specific are most compatible with chronic small vessel ischemic disease. No acute large vascular territory infarcts. No abnormal extra-axial fluid collections. Basal cisterns are patent. VASCULAR: Unremarkable. SKULL: No skull fracture. No significant scalp soft tissue swelling. SINUSES/ORBITS: The mastoid air-cells and included paranasal sinuses are well-aerated.The included ocular  globes and orbital contents are non-suspicious. OTHER: None. IMPRESSION: No intracranial metastasis. Normal CT HEAD with and without contrast for age. Electronically Signed   By: Elon Alas M.D.   On: 03/12/2016 20:12   Ct Chest W Contrast  Result Date: 03/09/2016 CLINICAL DATA:  Increased shortness of breath for several days. Elevated LFTs with multiple hepatic masses demonstrated on ultrasound. No known malignancy. EXAM: CT CHEST, ABDOMEN, AND PELVIS WITH CONTRAST TECHNIQUE: Multidetector CT imaging of the chest, abdomen and pelvis was performed following the standard protocol during bolus administration of intravenous contrast. CONTRAST:  149mL ISOVUE-300 IOPAMIDOL (ISOVUE-300) INJECTION 61% COMPARISON:  Right upper quadrant ultrasound earlier this day. Chest radiographs yesterday. Noncontrast abdominal CT 12/2015 15 FINDINGS: CT CHEST FINDINGS Cardiovascular: The heart is normal in size. Right-sided pacemaker in place. Thoracic aorta is normal in caliber with trace atherosclerosis of the arch. No filling defects in the central pulmonary arteries. Mediastinum/Nodes: Normal size subcarinal and pretracheal nodes. No pathologically enlarged mediastinal or hilar adenopathy. No axillary adenopathy. No pericardial fluid. Lungs/Pleura: Multifocal pulmonary nodules throughout all lobes of both lungs consistent with metastatic disease. The majority of these nodules are subcentimeter in size, however there is an ovoid 2.0 x 1.3 cm nodule in the perihilar left upper lobe series 3, image 76, with smooth margins. No confluent airspace disease. Mild central bronchial wall thickening. Trace right pleural thickening without frank pleural effusion. Musculoskeletal: Remote fracture of posterior right seventh rib without callus, possible nonunion. No blastic or evidence of destructive lytic lesions. CT ABDOMEN PELVIS FINDINGS Hepatobiliary: Innumerable hypodense hepatic lesions throughout the liver which is enlarged  consistent with metastatic disease. Index lesion in the subcapsular right lobe segment 5 measures 3.9 x 4.3 cm bi- axial measurements, appears confluent with adjacent lesions on coronal reformats. Clips in the gallbladder fossa postcholecystectomy. Pancreas: Ill-defined heterogeneous hypodense mass in the distal pancreatic body measures approximately 3.6 x 4.9 cm. Mild distal pancreatic ductal dilatation. This mass abuts the splenic artery. This is not about the superior mesenteric artery or vein. Spleen: Enlarged measuring 15.3 cm AP dimension, heterogeneous in density, without well-defined discrete lesion. The splenic vein is not visualized. Adrenals/Urinary Tract: No adrenal nodule. There are multiple bilateral water density lesions throughout both kidneys consistent with simple cysts. Additional small cortical hypodensities are too small to accurately characterize. No hydronephrosis. Symmetric excretion on delayed phase imaging. Stomach/Bowel: Small hiatal hernia. Stomach is physiologically distended. No bowel wall thickening, inflammation or obstruction. Sigmoid and descending colonic diverticulosis without acute diverticulitis. The appendix is not confidently identified. Vascular/Lymphatic: Paraesophageal retrocrural node measures 11 mm. Multiple small lymph nodes at the origin of the celiac and SMA. A 10 mm node is noted in the aortocaval region of the level of the mid kidneys. There are enlarged nodes in the porta hepatis. Mild aorta bi-iliac atherosclerosis without aneurysm. The splenic vein is not visualized in its normal location, collateral vessels in the splenic hilum with a tortuous collateral  vein coursing anteriorly in the abdomen, joining abut the superior mesenteric vein just proximal to the portal confluence. Reproductive: Prostate is unremarkable. Other: Small amount of perihepatic ascites.  No free air. Musculoskeletal: Sclerotic focus within the L1 vertebral body is nonspecific, however appears  unchanged from CT abdomen 01/04/2014 and is likely a bone island. No destructive lytic lesion. IMPRESSION: 1. Pancreatic mass measures 4.9 cm consistent with primary pancreatic malignancy, characteristics suggesting adenocarcinoma. This lesion abuts the splenic artery. 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 2. Diffuse hepatic metastatic disease. Small volume perihepatic ascites. 3. Diffuse pulmonary metastatic disease with multiple pulmonary nodules. 4. Metastatic upper abdominal adenopathy. Spleen appears enlarged and heterogeneous but no discrete lesion is seen. These results will be called to the ordering clinician or representative by the Radiologist Assistant, and communication documented in the PACS or zVision Dashboard. Electronically Signed   By: Jeb Levering M.D.   On: 03/09/2016 21:37   Ct Abdomen Pelvis W Contrast  Result Date: 03/09/2016 CLINICAL DATA:  Increased shortness of breath for several days. Elevated LFTs with multiple hepatic masses demonstrated on ultrasound. No known malignancy. EXAM: CT CHEST, ABDOMEN, AND PELVIS WITH CONTRAST TECHNIQUE: Multidetector CT imaging of the chest, abdomen and pelvis was performed following the standard protocol during bolus administration of intravenous contrast. CONTRAST:  171mL ISOVUE-300 IOPAMIDOL (ISOVUE-300) INJECTION 61% COMPARISON:  Right upper quadrant ultrasound earlier this day. Chest radiographs yesterday. Noncontrast abdominal CT 12/2015 15 FINDINGS: CT CHEST FINDINGS Cardiovascular: The heart is normal in size. Right-sided pacemaker in place. Thoracic aorta is normal in caliber with trace atherosclerosis of the arch. No filling defects in the central pulmonary arteries. Mediastinum/Nodes: Normal size subcarinal and pretracheal nodes. No pathologically enlarged mediastinal or hilar adenopathy. No axillary adenopathy. No pericardial fluid. Lungs/Pleura: Multifocal pulmonary nodules throughout all lobes of both lungs consistent with metastatic  disease. The majority of these nodules are subcentimeter in size, however there is an ovoid 2.0 x 1.3 cm nodule in the perihilar left upper lobe series 3, image 76, with smooth margins. No confluent airspace disease. Mild central bronchial wall thickening. Trace right pleural thickening without frank pleural effusion. Musculoskeletal: Remote fracture of posterior right seventh rib without callus, possible nonunion. No blastic or evidence of destructive lytic lesions. CT ABDOMEN PELVIS FINDINGS Hepatobiliary: Innumerable hypodense hepatic lesions throughout the liver which is enlarged consistent with metastatic disease. Index lesion in the subcapsular right lobe segment 5 measures 3.9 x 4.3 cm bi- axial measurements, appears confluent with adjacent lesions on coronal reformats. Clips in the gallbladder fossa postcholecystectomy. Pancreas: Ill-defined heterogeneous hypodense mass in the distal pancreatic body measures approximately 3.6 x 4.9 cm. Mild distal pancreatic ductal dilatation. This mass abuts the splenic artery. This is not about the superior mesenteric artery or vein. Spleen: Enlarged measuring 15.3 cm AP dimension, heterogeneous in density, without well-defined discrete lesion. The splenic vein is not visualized. Adrenals/Urinary Tract: No adrenal nodule. There are multiple bilateral water density lesions throughout both kidneys consistent with simple cysts. Additional small cortical hypodensities are too small to accurately characterize. No hydronephrosis. Symmetric excretion on delayed phase imaging. Stomach/Bowel: Small hiatal hernia. Stomach is physiologically distended. No bowel wall thickening, inflammation or obstruction. Sigmoid and descending colonic diverticulosis without acute diverticulitis. The appendix is not confidently identified. Vascular/Lymphatic: Paraesophageal retrocrural node measures 11 mm. Multiple small lymph nodes at the origin of the celiac and SMA. A 10 mm node is noted in the  aortocaval region of the level of the mid kidneys. There are enlarged  nodes in the porta hepatis. Mild aorta bi-iliac atherosclerosis without aneurysm. The splenic vein is not visualized in its normal location, collateral vessels in the splenic hilum with a tortuous collateral vein coursing anteriorly in the abdomen, joining abut the superior mesenteric vein just proximal to the portal confluence. Reproductive: Prostate is unremarkable. Other: Small amount of perihepatic ascites.  No free air. Musculoskeletal: Sclerotic focus within the L1 vertebral body is nonspecific, however appears unchanged from CT abdomen 01/04/2014 and is likely a bone island. No destructive lytic lesion. IMPRESSION: 1. Pancreatic mass measures 4.9 cm consistent with primary pancreatic malignancy, characteristics suggesting adenocarcinoma. This lesion abuts the splenic artery. 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 2. Diffuse hepatic metastatic disease. Small volume perihepatic ascites. 3. Diffuse pulmonary metastatic disease with multiple pulmonary nodules. 4. Metastatic upper abdominal adenopathy. Spleen appears enlarged and heterogeneous but no discrete lesion is seen. These results will be called to the ordering clinician or representative by the Radiologist Assistant, and communication documented in the PACS or zVision Dashboard. Electronically Signed   By: Jeb Levering M.D.   On: 03/09/2016 21:37   US Abdomen Limited Ruq  Result Date: 03/09/2016 CLINICAL DATA:  Elevated LFTs EXAM: US ABDOMEN LIMITED - RIGHT UPPER QUADRANT COMPARISON:  CT abdomen 01/04/2014 FINDINGS: Gallbladder: Prior cholecystectomy. Common bile duct: Diameter: 4.4 mm Liver: Heterogeneous echotexture with overall hepatic enlargement. Numerous hepatic masses throughout the liver with the largest in the right hepatic lobe measuring 3.9 x 4.1 x 5 cm. IMPRESSION: 1. Enlarged liver with multiple hepatic masses most concerning for metastatic disease. Recommend  further evaluation with CT or MRI of the abdomen. Electronically Signed   By: Kathreen Devoid   On: 03/09/2016 09:55    Microbiology: No results found for this or any previous visit (from the past 240 hour(s)).   Labs: Basic Metabolic Panel:  Recent Labs Lab 03/09/16 0656 03/10/16 0640 03/11/16 0634 03/13/16 0431 03/14/16 0555  NA 133* 133* 136 136 135  K 5.0 4.9 4.6 5.6* 4.5  CL 102 98* 101 100* 98*  CO2 21* 24 25 23 23   GLUCOSE 194* 145* 124* 152* 158*  BUN 32* 31* 28* 42* 52*  CREATININE 1.07 0.86 0.85 0.47* 1.03  CALCIUM 8.5* 8.8* 8.9 8.8* 8.7*   Liver Function Tests:  Recent Labs Lab 03/10/16 0640 03/11/16 0634 03/12/16 0448 03/13/16 0431 03/14/16 0555  AST 128* 162* 217* 276* 268*  ALT 77* 87* 113* 141* 159*  ALKPHOS 312* 313* 327* 345* 308*  BILITOT 5.7* 7.8* 11.4* 13.6* 13.8*  PROT 6.1* 6.2* 6.6 6.4* 5.9*  ALBUMIN 2.9* 2.9* 3.2* 3.0* 2.8*   No results for input(s): LIPASE, AMYLASE in the last 168 hours.  Recent Labs Lab 03/10/16 0850 03/13/16 0431  AMMONIA 21 82*   CBC:  Recent Labs Lab 03/10/16 0640 03/12/16 0448 03/13/16 0431  WBC 7.9 11.5* 15.9*  HGB 7.7* 9.3* 9.5*  HCT 24.4* 30.0* 30.7*  MCV 102.5* 107.5* 108.1*  PLT 166 192 198   Cardiac Enzymes:  Recent Labs Lab 03/08/16 1713  TROPONINI 0.03*   BNP: BNP (last 3 results)  Recent Labs  03/08/16 1216  BNP 272.0*    ProBNP (last 3 results) No results for input(s): PROBNP in the last 8760 hours.  CBG:  Recent Labs Lab 03/14/16 0754 03/14/16 1143 03/14/16 1605 03/14/16 2050 03/15/16 0743  GLUCAP 130* 143* 223* 198* 170*       Signed:  HERNANDEZ ACOSTA,ESTELA  [facility] Pager: [phone] 03/15/2016, 3:28 PM

## 2016-03-15 NOTE — Progress Notes (Signed)
Called and spoke with patient's son Tony [name] who stated he had not spoken with anyone other than Tony regarding his father's care and was concerned about communication with his mother as she was easily agitated and he felt her health would be affected with dealing with news regarding his father's poor health. We discussed the options of picking him up in the morning and him speaking with the physician and home healthcare plans as well as the follow up for the patient's liver biopsy at [facility] or the [place]. He states he will come here in the morning to pick up his father.  Spoke with [name], Director, and discussed son's conversation. Message left with [name] and Dr. [name].

## 2016-03-15 NOTE — Progress Notes (Signed)
Karolee Ohs RN notified me of patient's wife refusing to take patient home after patient's discharge. Spoke with patient's wife Salih Warwick  By phone regarding her concerns about her husband's discharge. She was very angry and demanded he be transferred to the [place] and stated he was too sick to be at home and it was not safe for her to care for him at home and she was not going to take him home.  Dr. [name] notifed and Thedore Mins with Social Services notifed of the wife's refusal to take the patient home. Dr. [name] stated she could not transfer the patient as he was discharged and [name] encouraged me to speak with her or the son regarding home care and taking the patient to the [place] themselves.

## 2016-03-15 NOTE — Progress Notes (Signed)
Discussed case with Dr,. [name] and Thedore Mins in social services again. Dr. [name] states she will not rescind the discharge order and Thedore Mins will follow with his team. We discussed calling the patient's son to explain to him the rationale for discharge and follow up.

## 2016-03-15 NOTE — Progress Notes (Signed)
Patients son called and was irate on the phone in regards to the care of his father. He stated we are not doing anything for him and we just want to get his money and no one talks to him and nobody knows what the .... We are doing. Listened to son and explained that wife was  Here today with her dog to see patient.  I told him I would relay his message to the physician and he hung up on me. We left message with Dr. [name] to call son and talk to him.

## 2016-03-15 NOTE — Progress Notes (Signed)
Despite 2 units of FFP, the patient's INR is still 1.82 today.  Dr. Annamaria Boots is not comfortable proceeding with a percutaneous liver biopsy with an INR of 1.8 due to high risk for bleeding.  I have spoken to Dr. [name] who will get back in touch with me as she has heard the possibility of a tx to [facility] from the GI service.  We will hold any plans until I hear back from her.  Advay Volante E 12:47 PM 03/15/2016

## 2016-03-15 NOTE — Care Management (Signed)
Santiago Glad of [facility] is aware patient is discharging home today.

## 2016-03-15 NOTE — Progress Notes (Signed)
Spoke with Dr. [name] and informed her of conversation with son.

## 2016-03-15 NOTE — Progress Notes (Signed)
Pt's wife was contacted to come pickup pt as he has been discharged.  Pt's wife became very belligerent and stated "I'm not coming to pick him up".  Pt's wife stated  "He's too much for me to take care of".  Duard Brady, Director, Dr. Henderson Baltimore, Frazier Butt and [place] with case management was notified.

## 2016-03-15 NOTE — Progress Notes (Signed)
Subjective: Feels ok. Chronic cough. Denies abdominal pain, N/V, bloody stools. No other GI complaints. Wants to go home.  Objective: Vital signs in last 24 hours: Temp:  [97.2 F (36.2 C)-98.8 F (37.1 C)] 98 F (36.7 C) (01/26 [id number]) Pulse Rate:  [62-88] 88 (01/26 0652) Resp:  [18] 18 (01/25 2229) BP: (119-154)/(57-98) 132/88 (01/26 0652) SpO2:  [95 %-100 %] 98 % (01/26 0652) Last BM Date: 03/13/16 General:   Sitting in chair, alert, pleasant. Eyes:  Scleral icterus.  Heart:  S1, S2 present, no murmurs noted.  Lungs: Clear to auscultation bilaterally, without wheezing, rales, or rhonchi.  Abdomen:  Bowel sounds present, soft. Abdomen appears distended, no tense ascites. TTP noted mid-abdomen to LUQ. No rebound or guarding. Jaundiced. Msk:  Symmetrical without gross deformities. Extremities:  Without clubbing or edema. Neurologic:  Alert and  oriented x4;  grossly normal neurologically. Psych:  Alert and cooperative. Normal mood and affect.  Intake/Output from previous day: 01/25 0701 - 01/26 0700 In: 775.2 [P.O.:120; I.V.:3; Blood:652.2] Out: -  Intake/Output this shift: Total I/O In: 100 [P.O.:100] Out: -   Lab Results:  Recent Labs  03/13/16 0431  WBC 15.9*  HGB 9.5*  HCT 30.7*  PLT 198   BMET  Recent Labs  03/13/16 0431 03/14/16 0555  NA 136 135  K 5.6* 4.5  CL 100* 98*  CO2 23 23  GLUCOSE 152* 158*  BUN 42* 52*  CREATININE 0.47* 1.03  CALCIUM 8.8* 8.7*   LFT  Recent Labs  03/13/16 0431 03/14/16 0555  PROT 6.4* 5.9*  ALBUMIN 3.0* 2.8*  AST 276* 268*  ALT 141* 159*  ALKPHOS 345* 308*  BILITOT 13.6* 13.8*   PT/INR  Recent Labs  03/14/16 0555 03/15/16 0447  LABPROT 21.4* 21.3*  INR 1.83 1.82   Hepatitis Panel No results for input(s): HEPBSAG, HCVAB, HEPAIGM, HEPBIGM in the last 72 hours.   Studies/Results: No results found.  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 Oneida Alar with Dr. Barbie Banner). 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 INR slow to come down,likely multifactorial in setting of medication effect, nutritional deficiency, hepatocellular disease. Liver biopsy on hold as preferred approach would be guided percutaneous liver biopsy as opposed to transjugular (blinded approach). Patient has received total of 35mg  vit K IV. Patient also received 2 units of FFP last night.  INR this morning remains 1.8.  Given hepatocellular disease, unlikely to decrease much. IR requires INR 1.5 or less for percutaneous biopsy. No comfortable with transjugular approach.    Elevated LFTs: in setting of metastatic disease. May eventually need endoscopic biliary intervention. Acute hepatitis panel negative. Jaundiced.  Anemia: Stable. No overt GI bleeding. Multifactorial in setting of malignancy.  At this point the patient is wanting to go home. We have been trying to bring his INR down for the past 2-3 days without success. Discussed with Dr. Oneida Alar and Dr. [name]. IR PA called our service and stated nobody there is comfortable to percutaneous approach with INR 1.8 nor with transjugular approach.   Plan: 1. Ok to discharge patient at this point 2. Will enter referrals to IR and Oncology at [facility] 3. [facility] or our office will call patient with appointment   Thank you for allowing [place] to participate in the care of Aida Puffer, DNP, AGNP-C Adult & Gerontological Nurse  Practitioner [facility]     LOS: 7 days    03/15/2016, 12:42 PM

## 2016-03-16 LAB — GLUCOSE, CAPILLARY: GLUCOSE-CAPILLARY: 154 mg/dL — AB (ref 65–99)

## 2016-03-16 NOTE — Progress Notes (Signed)
Patient ended up staying an extra night as patient's wife was unable to pick patient up last night. Discussed care with 2 sons today. We are in agreement of plan. Okay for discharge home today. No changes to discharge summary or medications.  Domingo Mend, MD [facility] Pager: [phone]

## 2016-03-16 NOTE — Final Progress Note (Signed)
Patient discharged with instructions, prescription, and care notes.  Verbalized understanding via teach back.  IV was removed and the site was WNL. Patient voiced no further complaints or concerns at the time of discharge.  Appointments scheduled per instructions.  Patient left the floor via w/c family  And staff in stable condition. 

## 2016-03-17 ENCOUNTER — Emergency Department (HOSPITAL_COMMUNITY): Payer: Non-veteran care

## 2016-03-17 ENCOUNTER — Encounter (HOSPITAL_COMMUNITY): Payer: Self-pay | Admitting: *Deleted

## 2016-03-17 ENCOUNTER — Inpatient Hospital Stay (HOSPITAL_COMMUNITY)
Admission: EM | Admit: 2016-03-17 | Discharge: 2016-03-21 | DRG: 441 | Disposition: E | Payer: Non-veteran care | Attending: Pulmonary Disease | Admitting: Pulmonary Disease

## 2016-03-17 DIAGNOSIS — K869 Disease of pancreas, unspecified: Secondary | ICD-10-CM | POA: Diagnosis not present

## 2016-03-17 DIAGNOSIS — R451 Restlessness and agitation: Secondary | ICD-10-CM | POA: Diagnosis present

## 2016-03-17 DIAGNOSIS — Z4659 Encounter for fitting and adjustment of other gastrointestinal appliance and device: Secondary | ICD-10-CM

## 2016-03-17 DIAGNOSIS — R740 Nonspecific elevation of levels of transaminase and lactic acid dehydrogenase [LDH]: Secondary | ICD-10-CM | POA: Diagnosis present

## 2016-03-17 DIAGNOSIS — R4182 Altered mental status, unspecified: Secondary | ICD-10-CM | POA: Diagnosis not present

## 2016-03-17 DIAGNOSIS — C259 Malignant neoplasm of pancreas, unspecified: Secondary | ICD-10-CM | POA: Diagnosis present

## 2016-03-17 DIAGNOSIS — E1140 Type 2 diabetes mellitus with diabetic neuropathy, unspecified: Secondary | ICD-10-CM | POA: Diagnosis present

## 2016-03-17 DIAGNOSIS — R001 Bradycardia, unspecified: Secondary | ICD-10-CM | POA: Diagnosis not present

## 2016-03-17 DIAGNOSIS — I248 Other forms of acute ischemic heart disease: Secondary | ICD-10-CM | POA: Diagnosis present

## 2016-03-17 DIAGNOSIS — E875 Hyperkalemia: Secondary | ICD-10-CM | POA: Diagnosis not present

## 2016-03-17 DIAGNOSIS — N400 Enlarged prostate without lower urinary tract symptoms: Secondary | ICD-10-CM | POA: Diagnosis present

## 2016-03-17 DIAGNOSIS — Z89421 Acquired absence of other right toe(s): Secondary | ICD-10-CM

## 2016-03-17 DIAGNOSIS — E872 Acidosis, unspecified: Secondary | ICD-10-CM

## 2016-03-17 DIAGNOSIS — Z794 Long term (current) use of insulin: Secondary | ICD-10-CM

## 2016-03-17 DIAGNOSIS — I482 Chronic atrial fibrillation: Secondary | ICD-10-CM | POA: Diagnosis present

## 2016-03-17 DIAGNOSIS — R791 Abnormal coagulation profile: Secondary | ICD-10-CM | POA: Diagnosis present

## 2016-03-17 DIAGNOSIS — J9601 Acute respiratory failure with hypoxia: Secondary | ICD-10-CM | POA: Diagnosis not present

## 2016-03-17 DIAGNOSIS — E871 Hypo-osmolality and hyponatremia: Secondary | ICD-10-CM | POA: Diagnosis present

## 2016-03-17 DIAGNOSIS — D638 Anemia in other chronic diseases classified elsewhere: Secondary | ICD-10-CM | POA: Diagnosis present

## 2016-03-17 DIAGNOSIS — N179 Acute kidney failure, unspecified: Secondary | ICD-10-CM | POA: Diagnosis present

## 2016-03-17 DIAGNOSIS — J440 Chronic obstructive pulmonary disease with acute lower respiratory infection: Secondary | ICD-10-CM | POA: Diagnosis present

## 2016-03-17 DIAGNOSIS — J9690 Respiratory failure, unspecified, unspecified whether with hypoxia or hypercapnia: Secondary | ICD-10-CM | POA: Diagnosis present

## 2016-03-17 DIAGNOSIS — K649 Unspecified hemorrhoids: Secondary | ICD-10-CM | POA: Diagnosis present

## 2016-03-17 DIAGNOSIS — M109 Gout, unspecified: Secondary | ICD-10-CM | POA: Diagnosis present

## 2016-03-17 DIAGNOSIS — Z515 Encounter for palliative care: Secondary | ICD-10-CM | POA: Diagnosis not present

## 2016-03-17 DIAGNOSIS — Z7189 Other specified counseling: Secondary | ICD-10-CM

## 2016-03-17 DIAGNOSIS — Z8051 Family history of malignant neoplasm of kidney: Secondary | ICD-10-CM

## 2016-03-17 DIAGNOSIS — Z8042 Family history of malignant neoplasm of prostate: Secondary | ICD-10-CM

## 2016-03-17 DIAGNOSIS — Z01818 Encounter for other preprocedural examination: Secondary | ICD-10-CM

## 2016-03-17 DIAGNOSIS — R590 Localized enlarged lymph nodes: Secondary | ICD-10-CM | POA: Diagnosis present

## 2016-03-17 DIAGNOSIS — F4310 Post-traumatic stress disorder, unspecified: Secondary | ICD-10-CM | POA: Diagnosis present

## 2016-03-17 DIAGNOSIS — E039 Hypothyroidism, unspecified: Secondary | ICD-10-CM | POA: Diagnosis present

## 2016-03-17 DIAGNOSIS — J180 Bronchopneumonia, unspecified organism: Secondary | ICD-10-CM | POA: Diagnosis present

## 2016-03-17 DIAGNOSIS — G9340 Encephalopathy, unspecified: Secondary | ICD-10-CM | POA: Diagnosis present

## 2016-03-17 DIAGNOSIS — Z8249 Family history of ischemic heart disease and other diseases of the circulatory system: Secondary | ICD-10-CM

## 2016-03-17 DIAGNOSIS — C787 Secondary malignant neoplasm of liver and intrahepatic bile duct: Secondary | ICD-10-CM | POA: Diagnosis present

## 2016-03-17 DIAGNOSIS — C7800 Secondary malignant neoplasm of unspecified lung: Secondary | ICD-10-CM | POA: Diagnosis present

## 2016-03-17 DIAGNOSIS — Z950 Presence of cardiac pacemaker: Secondary | ICD-10-CM

## 2016-03-17 DIAGNOSIS — Z66 Do not resuscitate: Secondary | ICD-10-CM | POA: Diagnosis present

## 2016-03-17 DIAGNOSIS — K7200 Acute and subacute hepatic failure without coma: Secondary | ICD-10-CM | POA: Diagnosis present

## 2016-03-17 DIAGNOSIS — K8689 Other specified diseases of pancreas: Secondary | ICD-10-CM

## 2016-03-17 DIAGNOSIS — Z9049 Acquired absence of other specified parts of digestive tract: Secondary | ICD-10-CM

## 2016-03-17 DIAGNOSIS — A419 Sepsis, unspecified organism: Secondary | ICD-10-CM | POA: Diagnosis not present

## 2016-03-17 DIAGNOSIS — Z79899 Other long term (current) drug therapy: Secondary | ICD-10-CM

## 2016-03-17 LAB — COMPREHENSIVE METABOLIC PANEL
ALT: 262 U/L — ABNORMAL HIGH (ref 17–63)
ANION GAP: 21 — AB (ref 5–15)
AST: 406 U/L — ABNORMAL HIGH (ref 15–41)
Albumin: 2.5 g/dL — ABNORMAL LOW (ref 3.5–5.0)
Alkaline Phosphatase: 401 U/L — ABNORMAL HIGH (ref 38–126)
BUN: 100 mg/dL — ABNORMAL HIGH (ref 6–20)
CALCIUM: 8.5 mg/dL — AB (ref 8.9–10.3)
CHLORIDE: 95 mmol/L — AB (ref 101–111)
CO2: 15 mmol/L — AB (ref 22–32)
Creatinine, Ser: 2.56 mg/dL — ABNORMAL HIGH (ref 0.61–1.24)
GFR calc non Af Amer: 24 mL/min — ABNORMAL LOW (ref >60)
GFR, EST AFRICAN AMERICAN: 28 mL/min — AB (ref >60)
Glucose, Bld: 255 mg/dL — ABNORMAL HIGH (ref 65–99)
Potassium: 6 mmol/L — ABNORMAL HIGH (ref 3.5–5.1)
SODIUM: 131 mmol/L — AB (ref 135–145)
Total Bilirubin: 30.3 mg/dL (ref 0.3–1.2)
Total Protein: 5.9 g/dL — ABNORMAL LOW (ref 6.5–8.1)

## 2016-03-17 LAB — BASIC METABOLIC PANEL
ANION GAP: 20 — AB (ref 5–15)
BUN: 96 mg/dL — ABNORMAL HIGH (ref 6–20)
CALCIUM: 8 mg/dL — AB (ref 8.9–10.3)
CO2: 16 mmol/L — ABNORMAL LOW (ref 22–32)
CREATININE: 2.23 mg/dL — AB (ref 0.61–1.24)
Chloride: 98 mmol/L — ABNORMAL LOW (ref 101–111)
GFR, EST AFRICAN AMERICAN: 33 mL/min — AB (ref >60)
GFR, EST NON AFRICAN AMERICAN: 28 mL/min — AB (ref >60)
Glucose, Bld: 276 mg/dL — ABNORMAL HIGH (ref 65–99)
Potassium: 5.4 mmol/L — ABNORMAL HIGH (ref 3.5–5.1)
SODIUM: 134 mmol/L — AB (ref 135–145)

## 2016-03-17 LAB — GLUCOSE, CAPILLARY: GLUCOSE-CAPILLARY: 115 mg/dL — AB (ref 65–99)

## 2016-03-17 LAB — I-STAT ARTERIAL BLOOD GAS, ED
ACID-BASE DEFICIT: 12 mmol/L — AB (ref 0.0–2.0)
Bicarbonate: 13 mmol/L — ABNORMAL LOW (ref 20.0–28.0)
O2 SAT: 97 %
PCO2 ART: 24.7 mmHg — AB (ref 32.0–48.0)
TCO2: 14 mmol/L (ref 0–100)
pH, Arterial: 7.329 — ABNORMAL LOW (ref 7.350–7.450)
pO2, Arterial: 96 mmHg (ref 83.0–108.0)

## 2016-03-17 LAB — CBC WITH DIFFERENTIAL/PLATELET
BASOS ABS: 0 10*3/uL (ref 0.0–0.1)
Band Neutrophils: 0 %
Basophils Relative: 0 %
Blasts: 0 %
EOS ABS: 0 10*3/uL (ref 0.0–0.7)
Eosinophils Relative: 0 %
HEMATOCRIT: 27.9 % — AB (ref 39.0–52.0)
HEMOGLOBIN: 8.5 g/dL — AB (ref 13.0–17.0)
LYMPHS PCT: 4 %
Lymphs Abs: 0.7 10*3/uL (ref 0.7–4.0)
MCH: 34.6 pg — ABNORMAL HIGH (ref 26.0–34.0)
MCHC: 30.5 g/dL (ref 30.0–36.0)
MCV: 113.4 fL — ABNORMAL HIGH (ref 78.0–100.0)
METAMYELOCYTES PCT: 0 %
MONOS PCT: 7 %
Monocytes Absolute: 1.2 10*3/uL — ABNORMAL HIGH (ref 0.1–1.0)
Myelocytes: 0 %
NEUTROS ABS: 15 10*3/uL — AB (ref 1.7–7.7)
Neutrophils Relative %: 89 %
Other: 0 %
PROMYELOCYTES ABS: 0 %
Platelets: 95 10*3/uL — ABNORMAL LOW (ref 150–400)
RBC: 2.46 MIL/uL — ABNORMAL LOW (ref 4.22–5.81)
RDW: 28.9 % — ABNORMAL HIGH (ref 11.5–15.5)
WBC: 16.9 10*3/uL — AB (ref 4.0–10.5)
nRBC: 0 /100 WBC

## 2016-03-17 LAB — I-STAT TROPONIN, ED: Troponin i, poc: 0.05 ng/mL (ref 0.00–0.08)

## 2016-03-17 LAB — CBG MONITORING, ED: Glucose-Capillary: 249 mg/dL — ABNORMAL HIGH (ref 65–99)

## 2016-03-17 LAB — URINALYSIS, ROUTINE W REFLEX MICROSCOPIC
GLUCOSE, UA: 50 mg/dL — AB
KETONES UR: NEGATIVE mg/dL
LEUKOCYTES UA: NEGATIVE
NITRITE: NEGATIVE
PH: 5 (ref 5.0–8.0)
Protein, ur: 30 mg/dL — AB
SPECIFIC GRAVITY, URINE: 1.016 (ref 1.005–1.030)

## 2016-03-17 LAB — I-STAT CG4 LACTIC ACID, ED: LACTIC ACID, VENOUS: 11.31 mmol/L — AB (ref 0.5–1.9)

## 2016-03-17 LAB — PROCALCITONIN: Procalcitonin: 4.35 ng/mL

## 2016-03-17 LAB — AMMONIA: Ammonia: 45 umol/L — ABNORMAL HIGH (ref 9–35)

## 2016-03-17 LAB — LACTIC ACID, PLASMA: Lactic Acid, Venous: 5.7 mmol/L (ref 0.5–1.9)

## 2016-03-17 LAB — PROTIME-INR
INR: 2.56
PROTHROMBIN TIME: 28 s — AB (ref 11.4–15.2)

## 2016-03-17 MED ORDER — SODIUM CHLORIDE 0.9 % IV BOLUS (SEPSIS): 500.0000 mL | Freq: Once | INTRAVENOUS | Status: DC

## 2016-03-17 MED ORDER — SODIUM CHLORIDE 0.9 % IV SOLN
1.0000 g | Freq: Once | INTRAVENOUS | Status: AC
  Administered 2016-03-17: 1 g via INTRAVENOUS
  Filled 2016-03-17: qty 10

## 2016-03-17 MED ORDER — SODIUM CHLORIDE 0.9 % IV BOLUS (SEPSIS)
1000.0000 mL | Freq: Once | INTRAVENOUS | Status: AC
  Administered 2016-03-17: 1000 mL via INTRAVENOUS

## 2016-03-17 MED ORDER — VANCOMYCIN HCL IN DEXTROSE 1-5 GM/200ML-% IV SOLN
1000.0000 mg | Freq: Once | INTRAVENOUS | Status: AC
  Administered 2016-03-17: 1000 mg via INTRAVENOUS
  Filled 2016-03-17: qty 200

## 2016-03-17 MED ORDER — SODIUM CHLORIDE 0.9 % IV SOLN
INTRAVENOUS | Status: DC
  Administered 2016-03-17 – 2016-03-19 (×4): via INTRAVENOUS

## 2016-03-17 MED ORDER — VITAMIN K1 10 MG/ML IJ SOLN
5.0000 mg | Freq: Every day | INTRAVENOUS | Status: AC
  Administered 2016-03-17 – 2016-03-19 (×3): 5 mg via INTRAVENOUS
  Filled 2016-03-17 (×3): qty 0.5

## 2016-03-17 MED ORDER — METHYLPREDNISOLONE SODIUM SUCC 40 MG IJ SOLR
40.0000 mg | Freq: Every day | INTRAMUSCULAR | Status: DC
  Administered 2016-03-17 – 2016-03-19 (×3): 40 mg via INTRAVENOUS
  Filled 2016-03-17 (×3): qty 1

## 2016-03-17 MED ORDER — LACTULOSE ENEMA
300.0000 mL | Freq: Once | ORAL | Status: AC
  Administered 2016-03-17: 300 mL via RECTAL
  Filled 2016-03-17: qty 300

## 2016-03-17 MED ORDER — PIPERACILLIN-TAZOBACTAM 3.375 G IVPB 30 MIN
3.3750 g | Freq: Once | INTRAVENOUS | Status: AC
  Administered 2016-03-17: 3.375 g via INTRAVENOUS
  Filled 2016-03-17: qty 50

## 2016-03-17 MED ORDER — IPRATROPIUM-ALBUTEROL 0.5-2.5 (3) MG/3ML IN SOLN
3.0000 mL | Freq: Once | RESPIRATORY_TRACT | Status: AC
  Administered 2016-03-17: 3 mL via RESPIRATORY_TRACT
  Filled 2016-03-17: qty 3

## 2016-03-17 MED ORDER — CHLORHEXIDINE GLUCONATE 0.12 % MT SOLN
15.0000 mL | Freq: Two times a day (BID) | OROMUCOSAL | Status: DC
  Administered 2016-03-18 – 2016-03-19 (×4): 15 mL via OROMUCOSAL

## 2016-03-17 MED ORDER — PREDNISONE 20 MG PO TABS: 40.0000 mg | ORAL_TABLET | Freq: Every day | ORAL | Status: DC

## 2016-03-17 MED ORDER — SODIUM CHLORIDE 0.9 % IV SOLN: 250.0000 mL | INTRAVENOUS | Status: DC | PRN

## 2016-03-17 MED ORDER — LEVOTHYROXINE SODIUM 100 MCG IV SOLR
100.0000 ug | Freq: Every day | INTRAVENOUS | Status: DC
  Administered 2016-03-18 – 2016-03-19 (×2): 100 ug via INTRAVENOUS
  Filled 2016-03-17 (×2): qty 5

## 2016-03-17 MED ORDER — IPRATROPIUM-ALBUTEROL 0.5-2.5 (3) MG/3ML IN SOLN
3.0000 mL | Freq: Four times a day (QID) | RESPIRATORY_TRACT | Status: DC
  Administered 2016-03-18 – 2016-03-19 (×8): 3 mL via RESPIRATORY_TRACT
  Filled 2016-03-17 (×9): qty 3

## 2016-03-17 MED ORDER — ORAL CARE MOUTH RINSE
15.0000 mL | Freq: Two times a day (BID) | OROMUCOSAL | Status: DC
  Administered 2016-03-18 – 2016-03-19 (×3): 15 mL via OROMUCOSAL

## 2016-03-17 MED ORDER — PANTOPRAZOLE SODIUM 40 MG PO TBEC
40.0000 mg | DELAYED_RELEASE_TABLET | Freq: Every day | ORAL | Status: DC
  Administered 2016-03-17: 40 mg via ORAL
  Filled 2016-03-17: qty 1

## 2016-03-17 MED ORDER — PIPERACILLIN-TAZOBACTAM 3.375 G IVPB
3.3750 g | Freq: Three times a day (TID) | INTRAVENOUS | Status: DC
  Administered 2016-03-17 – 2016-03-19 (×5): 3.375 g via INTRAVENOUS
  Filled 2016-03-17 (×7): qty 50

## 2016-03-17 MED ORDER — INSULIN ASPART 100 UNIT/ML ~~LOC~~ SOLN
0.0000 [IU] | SUBCUTANEOUS | Status: DC
  Administered 2016-03-18: 2 [IU] via SUBCUTANEOUS
  Administered 2016-03-18: 5 [IU] via SUBCUTANEOUS
  Administered 2016-03-18: 2 [IU] via SUBCUTANEOUS
  Administered 2016-03-18 (×2): 3 [IU] via SUBCUTANEOUS
  Administered 2016-03-18: 8 [IU] via SUBCUTANEOUS
  Administered 2016-03-19: 2 [IU] via SUBCUTANEOUS

## 2016-03-17 MED ORDER — ALBUTEROL SULFATE (2.5 MG/3ML) 0.083% IN NEBU: 2.5000 mg | INHALATION_SOLUTION | RESPIRATORY_TRACT | Status: DC | PRN

## 2016-03-17 MED ORDER — VANCOMYCIN HCL IN DEXTROSE 1-5 GM/200ML-% IV SOLN
1000.0000 mg | INTRAVENOUS | Status: DC
  Administered 2016-03-18: 1000 mg via INTRAVENOUS
  Filled 2016-03-17 (×2): qty 200

## 2016-03-17 NOTE — ED Notes (Addendum)
2nd blood culture obtained and antibiotics started.  Pt uncomfortable/restless moving around in bed.  Multiple attempts to position for comfort.

## 2016-03-17 NOTE — Progress Notes (Signed)
Pharmacy Antibiotic Note  Tony [name] is a 71 y.o. male admitted on 02/20/2016 with pneumonia and sepsis.    Plan: Zosyn 3.375 gm iv q8h Vancomycin 1 mg q24h Monitor renal fx, cx, vt prn  Height: 6' (182.9 cm) Weight: 205 lb (93 kg) IBW/kg (Calculated) : 77.6  Temp (24hrs), Avg:97.8 F (36.6 C), Min:97.8 F (36.6 C), Max:97.8 F (36.6 C)   Recent Labs Lab 03/11/16 0634 03/12/16 0448 03/13/16 0431 03/14/16 0555 03/14/2016 1609 02/22/2016 1628  WBC  --  11.5* 15.9*  --   --   --   CREATININE 0.85  --  0.47* 1.03 2.56*  --   LATICACIDVEN  --   --   --   --   --  11.31*    Estimated Creatinine Clearance: 29.5 mL/min (by C-G formula based on SCr of 2.56 mg/dL (H)).    No Known Allergies  Levester Fresh, PharmD, BCPS, BCCCP Clinical Pharmacist 02/22/2016 5:23 PM

## 2016-03-17 NOTE — H&P (Signed)
PULMONARY / CRITICAL CARE MEDICINE   Name: Tony [name] MRN: [id number] DOB: [birth year]    ADMISSION DATE:  03/10/2016 CONSULTATION DATE:  1/28  REFERRING MD:  Rex Kras MD (EDP)  CHIEF COMPLAINT:  Acute encephalopathy  HISTORY OF PRESENT ILLNESS:   71 year old man with history of atrial fibrillation, COPD, DM2, recent diagnosis of pancreatic mass presenting with acute encephalopathy. Most of history obtained from wife due to encephalopathy. He was recently hospitalized at [facility] on 1/19 for increasing dyspnea and productive cough. He was diagnosed with multifocal bronchopneumonia. 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 Unable to obtain liver bx by IR due to elevated INR. He was discharged on 1/26 with follow up at [facility] for the pancreatic mass and Ceftin to finish his antibiotic course. Towards the end of his hospitalization, his wife noted that he was still have altered mentation. He continued to be encephalopathic at home. He was lethargic with poor PO intake. He had dark urine. He had dyspnea and cough. He had two formed BM yesterday with some bleeding when wiped. No fevers/chills, chest pain, dysuria, hematuria.  PAST MEDICAL HISTORY :  He  has a past medical history of Atrial fibrillation ([place]); COPD (chronic obstructive pulmonary disease) ([place]); Diabetes mellitus; Neuropathy ([place]); Pacemaker; Pancreatic mass (03/10/2016); PTSD (post-traumatic stress disorder); and Spinal stenosis.  PAST SURGICAL HISTORY: He  has a past surgical history that includes Cholecystectomy.  No Known Allergies  No current facility-administered medications on file prior to encounter.    Current Outpatient Prescriptions on File Prior to Encounter  Medication Sig  . albuterol (PROVENTIL HFA;VENTOLIN HFA) 108 (90 BASE) MCG/ACT inhaler Inhale 2 puffs into the lungs every 6 (six) hours as needed. Shortness of breath   .  cefUROXime (CEFTIN) 500 MG tablet Take 1 tablet (500 mg total) by mouth 2 (two) times daily with a meal.  . finasteride (PROSCAR) 5 MG tablet Take 5 mg by mouth at bedtime.   . fluticasone (FLONASE) 50 MCG/ACT nasal spray Place 1 spray into both nostrils daily as needed for allergies or rhinitis.  Marland Kitchen glipiZIDE (GLUCOTROL) 10 MG tablet Take 20 mg by mouth daily as needed (if BG >150).  . insulin glargine (LANTUS) 100 UNIT/ML injection Inject 15 Units into the skin as needed. If BG > 200, inject 15 units If BG > 225, inject 20 units  . lactulose (CHRONULAC) 10 GM/15ML solution Take 10 g by mouth daily as needed for mild constipation.  Marland Kitchen levothyroxine (SYNTHROID, LEVOTHROID) 100 MCG tablet Take 200 mcg by mouth every morning.   . mometasone (ASMANEX) 220 MCG/INH inhaler Inhale 2 puffs into the lungs 2 (two) times daily. May use at home medication  . nortriptyline (PAMELOR) 10 MG capsule Take 10 mg by mouth at bedtime.  Marland Kitchen omeprazole (PRILOSEC) 20 MG capsule Take 20 mg by mouth at bedtime.   Marland Kitchen oxyCODONE (OXY IR/ROXICODONE) 5 MG immediate release tablet Take 5 mg by mouth 3 (three) times daily as needed for moderate pain.   . pregabalin (LYRICA) 100 MG capsule Take 200 mg by mouth 2 (two) times daily.   . traZODone (DESYREL) 50 MG tablet Take 50 mg by mouth at bedtime as needed for sleep.     FAMILY HISTORY:  His indicated that the status of his mother is unknown. He indicated that the status of his brother is unknown.    SOCIAL HISTORY: He  reports that he  has never smoked. He has never used smokeless tobacco. He reports that he does not drink alcohol or use drugs.  REVIEW OF SYSTEMS:   10 point review of system performed and negative except as per HPI  SUBJECTIVE:  Dyspneic but no chest pain  VITAL SIGNS: BP 121/63   Pulse 67   Temp 97.8 F (36.6 C) (Oral)   Resp 17   Ht 6' (1.829 m)   Wt 205 lb (93 kg)   SpO2 100%   BMI 27.80 kg/m   VENTILATOR SETTINGS:    INTAKE / OUTPUT: No  intake/output data recorded.  PHYSICAL EXAMINATION: General:  Mild respiratory distress Neuro:  No focal deficits HEENT:  EOMI, icteric sclera, dry mucous membranes Cardiovascular:  RRR Lungs:  Few end expiratory wheezes Abdomen:  Soft, nontender, mild distention Musculoskeletal: 1 to 2+ pitting edema to knees Skin:  Jaundiced  LABS:  BMET  Recent Labs Lab 03/13/16 0431 03/14/16 0555 03/09/2016 1609  NA 136 135 131*  K 5.6* 4.5 6.0*  CL 100* 98* 95*  CO2 23 23 15*  BUN 42* 52* 100*  CREATININE 0.47* 1.03 2.56*  GLUCOSE 152* 158* 255*    Electrolytes  Recent Labs Lab 03/13/16 0431 03/14/16 0555 02/26/2016 1609  CALCIUM 8.8* 8.7* 8.5*    CBC  Recent Labs Lab 03/12/16 0448 03/13/16 0431 03/05/2016 1609  WBC 11.5* 15.9* 16.9*  HGB 9.3* 9.5* 8.5*  HCT 30.0* 30.7* 27.9*  PLT 192 198 95*    Coag's  Recent Labs Lab 03/14/16 0555 03/15/16 0447 03/06/2016 1609  INR 1.83 1.82 2.56    Sepsis Markers  Recent Labs Lab 03/18/2016 1628  LATICACIDVEN 11.31*    ABG  Recent Labs Lab 02/28/2016 1740  PHART 7.329*  PCO2ART 24.7*  PO2ART 96.0    Liver Enzymes  Recent Labs Lab 03/13/16 0431 03/14/16 0555 03/07/2016 1609  AST 276* 268* 406*  ALT 141* 159* 262*  ALKPHOS 345* 308* 401*  BILITOT 13.6* 13.8* 30.3*  ALBUMIN 3.0* 2.8* 2.5*    Cardiac Enzymes No results for input(s): TROPONINI, PROBNP in the last 168 hours.  Glucose  Recent Labs Lab 03/14/16 2050 03/15/16 0743 03/15/16 1732 03/15/16 2221 03/16/16 0822 03/05/2016 1606  GLUCAP 198* 170* 155* 163* 154* 249*    Imaging Dg Chest Port 1 View  Result Date: 02/29/2016 CLINICAL DATA:  Recent discharge for pneumonia.  Weakness.  COPD. EXAM: PORTABLE CHEST 1 VIEW COMPARISON:  03/08/2016 and chest CT 03/09/2016, chest x-ray 06/02/2015 FINDINGS: Right-sided pacemaker unchanged. Lungs are adequately inflated demonstrate persistent patchy nodular opacities over the mid to lower lungs unchanged  compatible a recent CT findings of probable pulmonary metastatic disease. No focal airspace consolidation or effusion. Cardiomediastinal silhouette and remainder of the exam is unchanged. IMPRESSION: No acute cardiopulmonary disease. Stable patchy nodular opacities over the mid to lower lungs as seen on recent CT suggesting pulmonary metastatic disease. Electronically Signed   By: Marin Olp M.D.   On: 02/19/2016 17:36     STUDIES:  CXR 1/28 > No acute abnormalities. Stable patchy nodular opacities over the mid to lower lungs as seen on recent CT. EKG 1/28 > rate controlled atrial fibrillation, similar to previous EKG  CULTURES: 1/28 BCx > 1/28 RVP > 1/28 Sputum cx > 1/28 UCx >  ANTIBIOTICS: Vanc 1/28 >  Zosyn 1/28 >  SIGNIFICANT EVENTS: 1/28 admit to ICU  LINES/TUBES: PIV  DISCUSSION: 71 year old man with history of atrial fibrillation, COPD, DM2, recent diagnosis of pancreatic mass and likely  metastatic disease to liver and lungs presenting with acute encephalopathy, dyspnea, cough. Recently hospitalized at [facility] on 1/19 for multifocal bronchopneumonia. Unable to obtain liver bx by IR due to elevated INR.   ASSESSMENT / PLAN:  PULMONARY A: Metastatic disease (see below) Recent multilobar PNA with sepsis: was d/c'd with Ceftin COPD P:   Supplemental oxygen prn to maintain O2 sat >88% Scheduled duonebs Prednisone daily  CARDIOVASCULAR A:  Elevated troponin> 0.05 Atrial fibrillation> anticoagulation on hold for bx P:  Trend trops  RENAL A:   Metabolic acidosis with increased anion gap > ABG pH 7.33, pCO2 24.7, bicarb 15, gap 21 Lactic acidosis > 11.31 on admission Acute Hyponatremia > 131 on admission Hyperkalemia > K 6 on admission AKI > Cr 2.56 on admission. 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 Was referred to [facility] Intrahepatic Cholestasis > Alk phos up to 401 from 308, AST 406 from 268, ALT 262 from 159. Bilirubin 30 from 13.  Hemorrhoids P:   Consult IR when INR improved Was seen by Dr. Oneida Alar (GI) while at [place] A:   Anemia of chronic disease > Hgb 8.5, previously 9.5 Leukocytosis 16.9 Coagulopathy > PT 28 and INR 2.56 P:  70m vitamin K daily x 3  Follow PT/INR  INFECTIOUS A:   Possible HCAP, COPD exacerbation P:   1/28 BCx > 1/28 RVP > 1/28 Sputum cx > 1/28 UCx > Vanc 1/28 >  Zosyn 1/28 > Follow PCT  ENDOCRINE A:   DM2   P:   SSI  NEUROLOGIC A:   Acute encephalopathy > Ammonia 45 ?Dementia P:   RASS goal: 0   FAMILY  - Updates: Wife and son at bedside 1/28.   - Inter-disciplinary family meet or Palliative Care meeting due by:  2/4   JJacques Earthly MD  Internal Medicine PGY-3 Pager: [phone] no answer or after 3PM: [phone] 02/24/2016, 5:42 PM

## 2016-03-17 NOTE — ED Triage Notes (Signed)
The pt was just discharged from [place].  Yesterday  He was there for pneumonia.  He is jaundiced  Very  And family member is reporting that he has been this color since last Friday.  He is not speaking very much today  Eating very little   He is  Alert  Not answering questions  asked

## 2016-03-17 NOTE — ED Notes (Signed)
Hospitalist at bedside 

## 2016-03-17 NOTE — ED Provider Notes (Signed)
[facility] DEPT Provider Note   CSN: [id number] Arrival date & time: 02/25/2016 1552     History    Chief Complaint  Patient presents with  . Altered Mental Status     HPI Tony [name] is a 71 y.o. male.  71yo M w/ extensive PMH including COPD, A fib, pacemaker, new diagnosis of pancreatic mass with mets who p/w altered mental status. The patient was discharged from [facility] yesterday morning after an admission for multifocal pneumonia and jaundice. He has a known pancreatic mass with metastases but they were unable to do biopsy. He has been set up to have treatment at [facility]. Wife notes that since he has gotten home he has become more confused and was up most of the night due to delirium. He does not yet taken a dose of antibiotics that he was sent home with. She reports that he has had decreased appetite and has not been talking much. No known fevers. He reports mild upper abdominal pain that has been there for a while.   LEVEL 5 CAVEAT DUE TO AMS   Past Medical History:  Diagnosis Date  . Atrial fibrillation ([place])   . COPD (chronic obstructive pulmonary disease) ([place])   . Diabetes mellitus   . Neuropathy ([place])   . Pacemaker   . Pancreatic mass 03/10/2016  . PTSD (post-traumatic stress disorder)   . Spinal stenosis      Patient Active Problem List   Diagnosis Date Noted  . Malignancy ([place])   . Anemia   . Jaundice   . Liver masses 03/10/2016  . Lung mass 03/10/2016  . Mass of pancreas 03/10/2016  . Lobar pneumonia ([place]) 03/08/2016  . Elevated LFTs 03/08/2016  . H/O peripheral vascular disease 10/21/2013  . Sinoatrial node dysfunction ([place]) 10/21/2013  . Venous stasis ulcer of left leg 05/12/2012  . 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 Supratherapeutic INR 04/03/2012  . Diabetic ulcer of lower extremity ([place]) 04/02/2012  . Cellulitis of left lower extremity 04/02/2012  . Diabetic neuropathy ([place]) 01/13/2011  . Atrial fibrillation, chronic ([place])  01/13/2011  . Gout 01/13/2011  . BPH (benign prostatic hyperplasia) 01/13/2011  . Hypothyroidism 01/13/2011  . Diabetes type 2, controlled ([place]) 01/13/2011  . COPD (chronic obstructive pulmonary disease) ([place]) 01/13/2011    Past Surgical History:  Procedure Laterality Date  . CHOLECYSTECTOMY          Home Medications    Prior to Admission medications   Medication Sig Start Date End Date Taking? Authorizing Provider  albuterol (PROVENTIL HFA;VENTOLIN HFA) 108 (90 BASE) MCG/ACT inhaler Inhale 2 puffs into the lungs every 6 (six) hours as needed for wheezing or shortness of breath.    Yes Historical Provider, MD  cefUROXime (CEFTIN) 500 MG tablet Take 1 tablet (500 mg total) by mouth 2 (two) times daily with a meal. Patient taking differently: Take 500 mg by mouth 2 (two) times daily with a meal. 5 day course started 03/16/16 03/16/16  Yes Estela Leonie Green, MD  finasteride (PROSCAR) 5 MG tablet Take 5 mg by mouth at bedtime.    Yes Historical Provider, MD  levothyroxine (SYNTHROID, LEVOTHROID) 100 MCG tablet Take 200 mcg by mouth daily before breakfast.    Yes Historical Provider, MD  nortriptyline (PAMELOR) 10 MG capsule Take 10 mg by mouth at bedtime.   Yes Historical Provider, MD  omeprazole (PRILOSEC) 20 MG capsule Take 20 mg by mouth at bedtime.    Yes Historical Provider, MD  oxyCODONE (OXY IR/ROXICODONE) 5 MG immediate release tablet Take 5 mg by mouth 3 (three) times daily as needed for moderate pain.    Yes Historical Provider, MD  traZODone (DESYREL) 50 MG tablet Take 50 mg by mouth at bedtime as needed for sleep.    Yes Historical Provider, MD  fluticasone (FLONASE) 50 MCG/ACT nasal spray Place 1 spray into both nostrils daily as needed for allergies or rhinitis.    Historical Provider, MD  glipiZIDE (GLUCOTROL) 10 MG tablet Take 20 mg by mouth daily as needed (if CBG >150).     Historical Provider, MD  insulin glargine (LANTUS) 100 UNIT/ML injection Inject 15 Units  into the skin daily as needed (CBG 200-225 15 units, >225 20 units). If BG > 200, inject 15 units If BG > 225, inject 20 units     Historical Provider, MD  lactulose (CHRONULAC) 10 GM/15ML solution Take 10 g by mouth daily as needed for mild constipation.    Historical Provider, MD  mometasone [facility]) 220 MCG/INH inhaler Inhale 2 puffs into the lungs 2 (two) times daily. May use at home medication    Historical Provider, MD  pregabalin (LYRICA) 100 MG capsule Take 200 mg by mouth 2 (two) times daily.     Historical Provider, MD      Family History  Problem Relation Age of Onset  . Heart disease Mother   . Prostate cancer Brother   . Kidney cancer Brother      Social History  Substance Use Topics  . Smoking status: Never Smoker  . Smokeless tobacco: Never Used  . Alcohol use No     Allergies     Patient has no known allergies.    Review of Systems  Unable to obtain ROS 2/2 AMS   Physical Exam Updated Vital Signs BP 135/95   Pulse 74   Temp 97.8 F (36.6 C) (Oral)   Resp 25   Ht 6' (1.829 m)   Wt 205 lb (93 kg)   SpO2 100%   BMI 27.80 kg/m   Physical Exam  Constitutional: No distress.  Ill appearing but awake and alert  HENT:  Head: Normocephalic and atraumatic.  Moist mucous membranes  Eyes: Conjunctivae are normal. Pupils are equal, round, and reactive to light. Scleral icterus is present.  Neck: Neck supple.  Cardiovascular: Normal rate.  An irregularly irregular rhythm present.  Murmur heard. Pulmonary/Chest: No respiratory distress. He has no wheezes.  Mildly increased WOB, scattered faint crackles b/l  Abdominal: Soft. Bowel sounds are normal. He exhibits distension (mild). There is tenderness. There is no rebound and no guarding.  Mild TTP epigastrium and RUQ  Musculoskeletal: He exhibits edema (1+ pitting edema BLE).  Neurological: He is alert.  Oriented to person and place, confused but able to answer basic questions, moving all 4 ext  Skin:  Skin is warm and dry.  Jaundice  Nursing note and vitals reviewed.     ED Treatments / Results  Labs (all labs ordered are listed, but only abnormal results are displayed) Labs Reviewed  COMPREHENSIVE METABOLIC PANEL - Abnormal; Notable for the following:       Result Value   Sodium 131 (*)    Potassium 6.0 (*)    Chloride 95 (*)    CO2 15 (*)    Glucose, Bld 255 (*)    BUN 100 (*)    Creatinine, Ser 2.56 (*)    Calcium 8.5 (*)    Total Protein 5.9 (*)  Albumin 2.5 (*)    AST 406 (*)    ALT 262 (*)    Alkaline Phosphatase 401 (*)    Total Bilirubin 30.3 (*)    GFR calc non Af Amer 24 (*)    GFR calc Af Amer 28 (*)    Anion gap 21 (*)    All other components within normal limits  CBC WITH DIFFERENTIAL/PLATELET - Abnormal; Notable for the following:    WBC 16.9 (*)    RBC 2.46 (*)    Hemoglobin 8.5 (*)    HCT 27.9 (*)    MCV 113.4 (*)    MCH 34.6 (*)    RDW 28.9 (*)    Platelets 95 (*)    Neutro Abs 15.0 (*)    Monocytes Absolute 1.2 (*)    All other components within normal limits  PROTIME-INR - Abnormal; Notable for the following:    Prothrombin Time 28.0 (*)    All other components within normal limits  AMMONIA - Abnormal; Notable for the following:    Ammonia 45 (*)    All other components within normal limits  CBG MONITORING, ED - Abnormal; Notable for the following:    Glucose-Capillary 249 (*)    All other components within normal limits  I-STAT CG4 LACTIC ACID, ED - Abnormal; Notable for the following:    Lactic Acid, Venous 11.31 (*)    All other components within normal limits  I-STAT ARTERIAL BLOOD GAS, ED - Abnormal; Notable for the following:    pH, Arterial 7.329 (*)    pCO2 arterial 24.7 (*)    Bicarbonate 13.0 (*)    Acid-base deficit 12.0 (*)    All other components within normal limits  URINE CULTURE  CULTURE, BLOOD (ROUTINE X 2)  CULTURE, BLOOD (ROUTINE X 2)  URINALYSIS, ROUTINE W REFLEX MICROSCOPIC  BLOOD GAS, ARTERIAL  I-STAT  TROPOININ, ED     EKG  EKG Interpretation  Date/Time:  Sunday March 17 2016 16:56:42 EST Ventricular Rate:  71 PR Interval:    QRS Duration: 159 QT Interval:  452 QTC Calculation: 492 R Axis:   88 Text Interpretation:  Atrial fibrillation LVH with secondary repolarization abnormality Borderline prolonged QT interval similar to previous EKG Confirmed by LITTLE MD, RACHEL [id number]) on 03/15/2016 4:59:58 PM         Radiology Dg Chest Port 1 View  Result Date: 03/18/2016 CLINICAL DATA:  Recent discharge for pneumonia.  Weakness.  COPD. EXAM: PORTABLE CHEST 1 VIEW COMPARISON:  03/08/2016 and chest CT 03/09/2016, chest x-ray 06/02/2015 FINDINGS: Right-sided pacemaker unchanged. Lungs are adequately inflated demonstrate persistent patchy nodular opacities over the mid to lower lungs unchanged compatible a recent CT findings of probable pulmonary metastatic disease. No focal airspace consolidation or effusion. Cardiomediastinal silhouette and remainder of the exam is unchanged. IMPRESSION: No acute cardiopulmonary disease. Stable patchy nodular opacities over the mid to lower lungs as seen on recent CT suggesting pulmonary metastatic disease. Electronically Signed   By: Marin Olp M.D.   On: 03/05/2016 17:36    Procedures Procedures (including critical care time) .Critical Care Performed by: Sharlett Iles Authorized by: Sharlett Iles   Critical care provider statement:    Critical care time (minutes):  45   Critical care time was exclusive of:  Separately billable procedures and treating other patients   Critical care was necessary to treat or prevent imminent or life-threatening deterioration of the following conditions:  Sepsis and hepatic failure   Critical care was  time spent personally by me on the following activities:  Development of treatment plan with patient or surrogate, discussions with consultants, evaluation of patient's response to treatment, examination  of patient, obtaining history from patient or surrogate, ordering and performing treatments and interventions, ordering and review of laboratory studies, ordering and review of radiographic studies, re-evaluation of patient's condition and review of old charts    Medications Ordered in ED  Medications  vancomycin (VANCOCIN) IVPB 1000 mg/200 mL premix (1,000 mg Intravenous New Bag/Given 02/29/2016 1718)  piperacillin-tazobactam (ZOSYN) IVPB 3.375 g (not administered)  vancomycin (VANCOCIN) IVPB 1000 mg/200 mL premix (not administered)  sodium chloride 0.9 % bolus 1,000 mL (0 mLs Intravenous Stopped 02/24/2016 1749)    And  sodium chloride 0.9 % bolus 1,000 mL (1,000 mLs Intravenous New Bag/Given 03/10/2016 1659)    And  sodium chloride 0.9 % bolus 1,000 mL (1,000 mLs Intravenous New Bag/Given 02/22/2016 1700)  piperacillin-tazobactam (ZOSYN) IVPB 3.375 g (0 g Intravenous Stopped 03/16/2016 1748)  ipratropium-albuterol (DUONEB) 0.5-2.5 (3) MG/3ML nebulizer solution 3 mL (3 mLs Nebulization Given 03/18/2016 1733)     Initial Impression / Assessment and Plan / ED Course  I have reviewed the triage vital signs and the nursing notes.  Pertinent labs & imaging results that were available during my care of the patient were reviewed by me and considered in my medical decision making (see chart for details).     Pt w/ recent diagnosis of multifocal pneumonia and pancreatic mass with metastases present with confuse and since discharge home yesterday. He was ill-appearing but in no acute distress at presentation. Awake with mild confusion but able to follow basic commands. Vital signs stable, afebrile. He did have some faint crackles bilaterally and significant jaundice. Initiated a code sepsis w/ cultures, vanc and zosyn, and 3L IVF after initial lactate was 11. Arterial pH 7.33 CO2 25, troponin normal, potassium 6 which may be hemolyzed as EKG does not show any hyperkalemic changes. Creatinine 2.56, AST 406 ALT 262  total bilirubin 30, anion gap 21. WBC 16.9, hemoglobin 8.5, platelets 95,000, INR 2.56 which is worse than yesterday. Given his significant metabolic derangements and severely elevated lactate, contacted her local care and discussed with Dr. Melvyn Novas. They will admit patient for further care. I updated family and had a long discussion regarding the very status of his condition. All questions answered.  After discussion with the patient and his family, they have determined that he is FULL CODE.  Final Clinical Impressions(s) / ED Diagnoses   Final diagnoses:  Altered mental status, unspecified altered mental status type  Acute liver failure without hepatic coma  Lactic acidosis     New Prescriptions   No medications on file       Sharlett Iles, MD 03/13/2016 [phone]

## 2016-03-17 NOTE — Progress Notes (Signed)
CRITICAL VALUE ALERT  Critical value received:  Lactic 5.7  Date of notification:  03/07/2016  Time of notification:  2240  Critical value read back:Yes.    Nurse who received alert:  Vic Blackbird RN  MD notified (1st page):  Martyn Malay, MD  Time of first page:  [phone]  Responding MD:  Quay Burow, MD  Time MD responded:  [phone]

## 2016-03-17 NOTE — ED Notes (Signed)
Dr. Little at bedside.  

## 2016-03-17 NOTE — ED Notes (Signed)
RN at bedside with antibiotics ready to infuse as soon as blood cultures obtained.  Lab at bedside attempting 2nd culture.

## 2016-03-17 NOTE — ED Notes (Signed)
Critical care at bedside  

## 2016-03-17 NOTE — ED Notes (Signed)
Attempted to draw blood prior to transport but was unsuccessful. Did not wish to delay care any further so continued with transport. Blood to be drawn on floor.

## 2016-03-18 ENCOUNTER — Inpatient Hospital Stay (HOSPITAL_COMMUNITY): Payer: Non-veteran care

## 2016-03-18 DIAGNOSIS — E872 Acidosis, unspecified: Secondary | ICD-10-CM

## 2016-03-18 DIAGNOSIS — K7200 Acute and subacute hepatic failure without coma: Secondary | ICD-10-CM

## 2016-03-18 DIAGNOSIS — K869 Disease of pancreas, unspecified: Secondary | ICD-10-CM

## 2016-03-18 DIAGNOSIS — Z7189 Other specified counseling: Secondary | ICD-10-CM

## 2016-03-18 DIAGNOSIS — Z515 Encounter for palliative care: Secondary | ICD-10-CM

## 2016-03-18 LAB — COMPREHENSIVE METABOLIC PANEL
ALK PHOS: 388 U/L — AB (ref 38–126)
ALT: 262 U/L — ABNORMAL HIGH (ref 17–63)
AST: 516 U/L — AB (ref 15–41)
Albumin: 2.2 g/dL — ABNORMAL LOW (ref 3.5–5.0)
Anion gap: 17 — ABNORMAL HIGH (ref 5–15)
BILIRUBIN TOTAL: 32.3 mg/dL — AB (ref 0.3–1.2)
BUN: 96 mg/dL — AB (ref 6–20)
CALCIUM: 7.9 mg/dL — AB (ref 8.9–10.3)
CO2: 17 mmol/L — ABNORMAL LOW (ref 22–32)
CREATININE: 2.11 mg/dL — AB (ref 0.61–1.24)
Chloride: 100 mmol/L — ABNORMAL LOW (ref 101–111)
GFR calc Af Amer: 35 mL/min — ABNORMAL LOW (ref >60)
GFR, EST NON AFRICAN AMERICAN: 30 mL/min — AB (ref >60)
Glucose, Bld: 237 mg/dL — ABNORMAL HIGH (ref 65–99)
POTASSIUM: 5.4 mmol/L — AB (ref 3.5–5.1)
Sodium: 134 mmol/L — ABNORMAL LOW (ref 135–145)
TOTAL PROTEIN: 5.3 g/dL — AB (ref 6.5–8.1)

## 2016-03-18 LAB — CBC
HEMATOCRIT: 25.5 % — AB (ref 39.0–52.0)
HEMOGLOBIN: 7.6 g/dL — AB (ref 13.0–17.0)
MCH: 33.8 pg (ref 26.0–34.0)
MCHC: 29.8 g/dL — ABNORMAL LOW (ref 30.0–36.0)
MCV: 113.3 fL — AB (ref 78.0–100.0)
Platelets: 66 10*3/uL — ABNORMAL LOW (ref 150–400)
RBC: 2.25 MIL/uL — ABNORMAL LOW (ref 4.22–5.81)
RDW: 29 % — AB (ref 11.5–15.5)
WBC: 15.2 10*3/uL — AB (ref 4.0–10.5)

## 2016-03-18 LAB — GLUCOSE, CAPILLARY
GLUCOSE-CAPILLARY: 209 mg/dL — AB (ref 65–99)
GLUCOSE-CAPILLARY: 257 mg/dL — AB (ref 65–99)
Glucose-Capillary: 180 mg/dL — ABNORMAL HIGH (ref 65–99)
Glucose-Capillary: 149 mg/dL — ABNORMAL HIGH (ref 65–99)
Glucose-Capillary: 166 mg/dL — ABNORMAL HIGH (ref 65–99)
Glucose-Capillary: 127 mg/dL — ABNORMAL HIGH (ref 65–99)

## 2016-03-18 LAB — RESPIRATORY PANEL BY PCR
ADENOVIRUS-RVPPCR: NOT DETECTED
BORDETELLA PERTUSSIS-RVPCR: NOT DETECTED
CORONAVIRUS 229E-RVPPCR: NOT DETECTED
CORONAVIRUS HKU1-RVPPCR: NOT DETECTED
CORONAVIRUS OC43-RVPPCR: NOT DETECTED
Chlamydophila pneumoniae: NOT DETECTED
Coronavirus NL63: NOT DETECTED
Influenza A: NOT DETECTED
Influenza B: NOT DETECTED
METAPNEUMOVIRUS-RVPPCR: DETECTED — AB
Mycoplasma pneumoniae: NOT DETECTED
PARAINFLUENZA VIRUS 1-RVPPCR: NOT DETECTED
PARAINFLUENZA VIRUS 2-RVPPCR: NOT DETECTED
PARAINFLUENZA VIRUS 3-RVPPCR: NOT DETECTED
Parainfluenza Virus 4: NOT DETECTED
RHINOVIRUS / ENTEROVIRUS - RVPPCR: NOT DETECTED
Respiratory Syncytial Virus: NOT DETECTED

## 2016-03-18 LAB — BASIC METABOLIC PANEL
ANION GAP: 18 — AB (ref 5–15)
BUN: 102 mg/dL — ABNORMAL HIGH (ref 6–20)
CALCIUM: 8.2 mg/dL — AB (ref 8.9–10.3)
CO2: 17 mmol/L — AB (ref 22–32)
Chloride: 104 mmol/L (ref 101–111)
Creatinine, Ser: 1.89 mg/dL — ABNORMAL HIGH (ref 0.61–1.24)
GFR, EST AFRICAN AMERICAN: 40 mL/min — AB (ref >60)
GFR, EST NON AFRICAN AMERICAN: 34 mL/min — AB (ref >60)
GLUCOSE: 139 mg/dL — AB (ref 65–99)
POTASSIUM: 5.6 mmol/L — AB (ref 3.5–5.1)
Sodium: 139 mmol/L (ref 135–145)

## 2016-03-18 LAB — TSH: TSH: 0.041 u[IU]/mL — ABNORMAL LOW (ref 0.350–4.500)

## 2016-03-18 LAB — PHOSPHORUS: Phosphorus: 7.6 mg/dL — ABNORMAL HIGH (ref 2.5–4.6)

## 2016-03-18 LAB — MRSA PCR SCREENING: MRSA by PCR: NEGATIVE

## 2016-03-18 LAB — PROTIME-INR
INR: 2.51
Prothrombin Time: 27.6 seconds — ABNORMAL HIGH (ref 11.4–15.2)

## 2016-03-18 LAB — LACTIC ACID, PLASMA: Lactic Acid, Venous: 4.5 mmol/L (ref 0.5–1.9)

## 2016-03-18 LAB — TROPONIN I
Troponin I: 0.06 ng/mL (ref <0.03)
Troponin I: 0.08 ng/mL (ref <0.03)

## 2016-03-18 LAB — MAGNESIUM: MAGNESIUM: 2.8 mg/dL — AB (ref 1.7–2.4)

## 2016-03-18 MED ORDER — SODIUM CHLORIDE 0.9 % IV SOLN
1.0000 g | Freq: Once | INTRAVENOUS | Status: AC
  Administered 2016-03-18: 1 g via INTRAVENOUS
  Filled 2016-03-18: qty 10

## 2016-03-18 MED ORDER — INSULIN ASPART 100 UNIT/ML IV SOLN
10.0000 [IU] | Freq: Once | INTRAVENOUS | Status: AC
  Administered 2016-03-18: 10 [IU] via INTRAVENOUS

## 2016-03-18 MED ORDER — LORAZEPAM 2 MG/ML IJ SOLN: 1.0000 mg | INTRAMUSCULAR | Status: DC | PRN

## 2016-03-18 MED ORDER — NORTRIPTYLINE HCL 10 MG PO CAPS
10.0000 mg | ORAL_CAPSULE | Freq: Every day | ORAL | Status: DC
  Filled 2016-03-18: qty 1

## 2016-03-18 MED ORDER — SODIUM POLYSTYRENE SULFONATE 15 GM/60ML PO SUSP
30.0000 g | Freq: Once | ORAL | Status: AC
  Administered 2016-03-18: 30 g via RECTAL
  Filled 2016-03-18: qty 120

## 2016-03-18 MED ORDER — DEXTROSE 50 % IV SOLN
INTRAVENOUS | Status: AC
  Administered 2016-03-18: 50 mL via INTRAVENOUS
  Filled 2016-03-18: qty 50

## 2016-03-18 MED ORDER — TRAZODONE HCL 50 MG PO TABS
50.0000 mg | ORAL_TABLET | Freq: Every day | ORAL | Status: DC
  Filled 2016-03-18: qty 1

## 2016-03-18 MED ORDER — INSULIN DETEMIR 100 UNIT/ML ~~LOC~~ SOLN
10.0000 [IU] | Freq: Two times a day (BID) | SUBCUTANEOUS | Status: DC
  Administered 2016-03-18 – 2016-03-19 (×3): 10 [IU] via SUBCUTANEOUS
  Filled 2016-03-18 (×5): qty 0.1

## 2016-03-18 MED ORDER — PANTOPRAZOLE SODIUM 40 MG IV SOLR
40.0000 mg | INTRAVENOUS | Status: DC
  Administered 2016-03-18 – 2016-03-19 (×2): 40 mg via INTRAVENOUS
  Filled 2016-03-18 (×2): qty 40

## 2016-03-18 MED ORDER — DEXTROSE 50 % IV SOLN
1.0000 | Freq: Once | INTRAVENOUS | Status: AC
  Administered 2016-03-18: 50 mL via INTRAVENOUS
  Filled 2016-03-18: qty 50

## 2016-03-18 MED ORDER — LACTULOSE ENEMA
300.0000 mL | Freq: Once | RECTAL | Status: AC
Start: 2016-03-18 — End: 2016-03-18
  Administered 2016-03-18: 300 mL via RECTAL
  Filled 2016-03-18: qty 300

## 2016-03-18 NOTE — Consult Note (Signed)
Consultation Note Date: 03/18/2016   Patient Name: Tony [name]  DOB: [birth year]  MRN: [id number]  Age / Sex: 71 y.o., male  PCP: [facility] Referring Physician: Marshell Garfinkel, MD  Reason for Consultation: Establishing goals of care and Psychosocial/spiritual support  HPI/Patient Profile: 72 y.o. male admitted on 03/05/2016 with PMH of afib, COPD, DM. Recent admission at [facility] with dyspnea, cough and he was treated for PNA. Also noted to have elevated LFTs.   Work up revealed findings consistent with metastatic pancreatic cancer with multiple liver, lung mets.  Liver biopsy was considered but could not be done due to elevated INR. Pt discharged on 1/27 with follow up at [facility]   IMPRESSION:   CT 03-09-16  1. Pancreatic mass measures 4.9 cm consistent with primary pancreatic malignancy, characteristics suggesting adenocarcinoma. This lesion abuts the splenic artery. 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 2. Diffuse hepatic metastatic disease. Small volume perihepatic ascites. 3. Diffuse pulmonary metastatic disease with multiple pulmonary nodules. 4. Metastatic upper abdominal adenopathy. Spleen appears enlarged and heterogeneous but no discrete lesion is seen. These results will be called to the ordering clinician or representative by the Radiologist Assistant, and communication documented in the PACS or zVision Dashboard.   He was readmitted within 24-48 hours with AMS, poor po intake.  Family tells me he was at [facility] when "he was told  to leave, there is nothing we can do for him here"   Family verbalize frustration and anger "it was just not right".  Emotional support offered. They feel "so much better here, we know he is getting good care now'.  Currently patient is in the ICU,  stable but with poor prognosis.    Family face advanced directive and EOL decisions, as they  struggle with limitations of medical interventions for meaningful recovery,  within the context of a strong faith perspective.    Clinical Assessment and Goals of Care:   This NP Wadie Lessen reviewed medical records, received report from team, assessed the patient and then meet at the patient's bedside along with his wife and four step-sons  to discuss diagnosis, prognosis, GOC, EOL wishes disposition and options.  We discussed in detail the extent of his cancer diagnosis and its natural trajectory.  Detailed limited treatment options in a person with limited physical and functional abilities.  A detailed discussion was had today regarding advanced directives.  Concepts specific to code status, artifical feeding and hydration, continued IV antibiotics and rehospitalization was had.  The difference between a aggressive medical intervention path  and a palliative comfort care path for this patient at this time was had.  Values and goals of care important to patient and family were attempted to be elicited.  Concept of  Palliative Care was discussed   Questions and concerns addressed.   Family encouraged to call with questions or concerns.    PMT will continue to support holistically.   SUMMARY OF RECOMMENDATIONS    Code Status/Advance Care Planning:  Full code-  recommended to  family DNR status knowing poor outcomes in similar patients.   They tell me he requested to "at least try to revive me if it doesn't work then let me go"   Ultimately family does not want prolonged heroic measures, they are willing to look at it day by day and make decisions dependant on outcomes   Palliative Prophylaxis:   Aspiration, Bowel Regimen, Delirium Protocol, Frequent Pain Assessment and Oral Care  Additional Recommendations (Limitations, Scope, Preferences):  Family is open to all offered and available medical  interventions to prolong life.  They all verbalize understanding of the serious medical  situation but also have a strong faith "that God can change anything"  Psycho-social/Spiritual:   Wife was engaged as she shared her husband's life story as a [place] [employer]et, Higher education careers adviser, professional wrestler, and published Chief Strategy Officer.     Desire for further Chaplaincy support:yes  Additional Recommendations: Grief/Bereavement Support    Discharge Planning: To Be Determined      Primary Diagnoses: Present on Admission: . Acute encephalopathy   I have reviewed the medical record, interviewed the patient and family, and examined the patient. The following aspects are pertinent.  Past Medical History:  Diagnosis Date  . Atrial fibrillation ([place])   . COPD (chronic obstructive pulmonary disease) ([place])   . Diabetes mellitus   . Neuropathy ([place])   . Pacemaker   . Pancreatic mass 03/10/2016  . PTSD (post-traumatic stress disorder)   . Spinal stenosis    Social History   Social History  . Marital status: Married    Spouse name: N/A  . Number of children: N/A  . Years of education: N/A   Social History Main Topics  . Smoking status: Never Smoker  . Smokeless tobacco: Never Used  . Alcohol use No  . Drug use: No  . Sexual activity: Yes    Birth control/ protection: None   Other Topics Concern  . None   Social History Narrative  . None   Family History  Problem Relation Age of Onset  . Heart disease Mother   . Prostate cancer Brother   . Kidney cancer Brother    Scheduled Meds: . chlorhexidine  15 mL Mouth Rinse BID  . insulin aspart  0-15 Units Subcutaneous Q4H  . insulin detemir  10 Units Subcutaneous BID  . ipratropium-albuterol  3 mL Nebulization Q6H  . levothyroxine  100 mcg Intravenous Daily  . mouth rinse  15 mL Mouth Rinse q12n4p  . methylPREDNISolone (SOLU-MEDROL) injection  40 mg Intravenous Daily  . nortriptyline  10 mg Oral QHS  . pantoprazole (PROTONIX) IV  40 mg Intravenous Q24H  . phytonadione (VITAMIN K) IV  5 mg Intravenous Daily  .  piperacillin-tazobactam (ZOSYN)  IV  3.375 g Intravenous Q8H  . traZODone  50 mg Oral QHS  . vancomycin  1,000 mg Intravenous Q24H   Continuous Infusions: . sodium chloride 75 mL/hr at 03/18/16 1058   PRN Meds:.sodium chloride, albuterol Medications Prior to Admission:  Prior to Admission medications   Medication Sig Start Date End Date Taking? Authorizing Provider  albuterol (PROAIR HFA) 108 (90 Base) MCG/ACT inhaler Inhale 2 puffs into the lungs every 6 (six) hours as needed for wheezing or shortness of breath.   Yes Historical Provider, MD  cefUROXime (CEFTIN) 500 MG tablet Take 1 tablet (500 mg total) by mouth 2 (two) times daily with a meal. Patient taking differently: Take 500 mg by mouth 2 (two) times daily with a meal. 5  day course started 03/16/16 03/16/16  Yes Estela Leonie Green, MD  finasteride (PROSCAR) 5 MG tablet Take 5 mg by mouth at bedtime.    Yes Historical Provider, MD  glipiZIDE (GLUCOTROL) 10 MG tablet Take 20 mg by mouth daily as needed (if CBG >150).    Yes Historical Provider, MD  insulin glargine (LANTUS) 100 UNIT/ML injection Inject 15 Units into the skin daily as needed (CBG 200-225 15 units, >225 20 units). If BG > 200, inject 15 units If BG > 225, inject 20 units    Yes Historical Provider, MD  lactulose (CHRONULAC) 10 GM/15ML solution Take 10 g by mouth daily as needed for mild constipation.   Yes Historical Provider, MD  levothyroxine (SYNTHROID, LEVOTHROID) 100 MCG tablet Take 200 mcg by mouth daily before breakfast.    Yes Historical Provider, MD  nortriptyline (PAMELOR) 10 MG capsule Take 10 mg by mouth at bedtime.   Yes Historical Provider, MD  omeprazole (PRILOSEC) 20 MG capsule Take 20 mg by mouth at bedtime.    Yes Historical Provider, MD  oxyCODONE (OXY IR/ROXICODONE) 5 MG immediate release tablet Take 5 mg by mouth 3 (three) times daily as needed for moderate pain.    Yes Historical Provider, MD  traZODone (DESYREL) 50 MG tablet Take 50 mg by mouth  at bedtime as needed for sleep.    Yes Historical Provider, MD  fluticasone (FLONASE) 50 MCG/ACT nasal spray Place 1 spray into both nostrils daily as needed for allergies or rhinitis.    Historical Provider, MD  mometasone [facility]) 220 MCG/INH inhaler Inhale 2 puffs into the lungs 2 (two) times daily. May use at home medication    Historical Provider, MD  pregabalin (LYRICA) 100 MG capsule Take 200 mg by mouth 2 (two) times daily.     Historical Provider, MD   No Known Allergies Review of Systems  Unable to perform ROS: Acuity of condition    Physical Exam  Constitutional: He appears well-developed. He appears lethargic. He appears ill.  HENT:  Mouth/Throat: Mucous membranes are dry.  Cardiovascular: Normal rate, regular rhythm and normal heart sounds.   Pulmonary/Chest: He has decreased breath sounds in the right lower field and the left lower field.  Neurological: He appears lethargic.  -unable to follow commands  Skin: Skin is warm and dry. Bruising and ecchymosis noted.  -jaundice    Vital Signs: BP 133/73   Pulse 87   Temp 98.4 F (36.9 C) (Oral)   Resp (!) 27   Ht 6' (1.829 m)   Wt 95.5 kg (210 lb 8.6 oz)   SpO2 100%   BMI 28.55 kg/m  Pain Assessment: No/denies pain   Pain Score: 0-No pain   SpO2: SpO2: 100 % O2 Device:SpO2: 100 % O2 Flow Rate: .O2 Flow Rate (L/min): 2 L/min  IO: Intake/output summary:  Intake/Output Summary (Last 24 hours) at 03/18/16 1401 Last data filed at 03/18/16 1300  Gross per 24 hour  Intake          7832.88 ml  Output             1065 ml  Net          6767.88 ml    LBM: Last BM Date: 03/18/16 Baseline Weight: Weight: 93 kg (205 lb) Most recent weight: Weight: 95.5 kg (210 lb 8.6 oz)      Palliative Assessment/Data: 20%    Flowsheet Rows   Flowsheet Row Most Recent Value  Intake Tab  Referral Department  Critical care  Unit at Time of Referral  ICU  Palliative Care Primary Diagnosis  Cancer  Date Notified  03/18/16    Palliative Care Type  New Palliative care  Reason for referral  Clarify Goals of Care  Date of Admission  02/21/2016  # of days IP prior to Palliative referral  1  Clinical Assessment  Psychosocial & Spiritual Assessment  Palliative Care Outcomes      Discussed with bedside RN  Time In: 1400 Time Out: 1515 Time Total: 75 min Greater than 50%  of this time was spent counseling and coordinating care related to the above assessment and plan.  Signed by: Wadie Lessen, NP   Please contact Palliative Medicine Team phone at [phone] for questions and concerns.  For individual provider: See Shea Evans

## 2016-03-18 NOTE — Progress Notes (Signed)
   [facility]  I spoke with son this am and told him re: most likely pancreatic CA with liver and lung mets. Palliative care spoke with wife and children this pm. Wife wants pt full code.  They want to hold off on liver bx because of increased risk for complications.  Pt protecting his airway, not on pressors.  Transfer to SDU with PCCM as primary for now.  Pt may end up not protecting airway and may need to be intubated .  Correcting K and rpt K later. Giving lactulose for ammonia (elevated).   Monica Becton, MD 03/18/2016, 3:06 PM [facility] Pager [phone] After 3 pm or if no answer, call [phone]

## 2016-03-18 NOTE — Progress Notes (Addendum)
PULMONARY / CRITICAL CARE MEDICINE   Name: Tony [name] MRN: [id number] DOB: 06-30-1945    ADMISSION DATE:  03/03/2016 CONSULTATION DATE:  1/28  REFERRING MD:  Rex Kras MD (EDP)  CHIEF COMPLAINT:  Acute encephalopathy  SUBJECTIVE:  Agitation and confusion last night. He reports dyspnea this morning.  VITAL SIGNS: BP 108/62   Pulse 80   Temp 98.6 F (37 C) (Oral)   Resp (!) 26   Ht 6' (1.829 m)   Wt 210 lb 8.6 oz (95.5 kg)   SpO2 100%   BMI 28.55 kg/m   VENTILATOR SETTINGS:    INTAKE / OUTPUT: I/O last 3 completed shifts: In: 7047.9 [I.V.:3497.9; IV Piggyback:3550] Out: 660 [Urine:660]  PHYSICAL EXAMINATION: General:  Mild respiratory distress Neuro:  No focal deficits HEENT:  EOMI, icteric sclera, dry mucous membranes Cardiovascular:  RRR Lungs:  Clear bilaterally, no wheezes Abdomen:  Soft, nontender, mild distention Musculoskeletal: 1 to 2+ pitting edema to knees Skin:  Jaundiced  LABS:  BMET  Recent Labs Lab 03/19/2016 1609 02/19/2016 2133 03/18/16 0347  NA 131* 134* 134*  K 6.0* 5.4* 5.4*  CL 95* 98* 100*  CO2 15* 16* 17*  BUN 100* 96* 96*  CREATININE 2.56* 2.23* 2.11*  GLUCOSE 255* 276* 237*    Electrolytes  Recent Labs Lab 02/25/2016 1609 03/15/2016 2133 03/18/16 0347  CALCIUM 8.5* 8.0* 7.9*  MG  --   --  2.8*  PHOS  --   --  7.6*    CBC  Recent Labs Lab 03/13/16 0431 02/28/2016 1609 03/18/16 0347  WBC 15.9* 16.9* 15.2*  HGB 9.5* 8.5* 7.6*  HCT 30.7* 27.9* 25.5*  PLT 198 95* 66*    Coag's  Recent Labs Lab 03/15/16 0447 03/03/2016 1609 03/18/16 0347  INR 1.82 2.56 2.51    Sepsis Markers  Recent Labs Lab 02/24/2016 1628 02/20/2016 2133 03/18/16 0016  LATICACIDVEN 11.31* 5.7* 4.5*  PROCALCITON  --  4.35  --     ABG  Recent Labs Lab 03/18/2016 1740  PHART 7.329*  PCO2ART 24.7*  PO2ART 96.0    Liver Enzymes  Recent Labs Lab 03/14/16 0555 03/10/2016 1609 03/18/16 0347  AST 268* 406* 516*  ALT 159* 262* 262*   ALKPHOS 308* 401* 388*  BILITOT 13.8* 30.3* 32.3*  ALBUMIN 2.8* 2.5* 2.2*    Cardiac Enzymes  Recent Labs Lab 03/18/16 0347  TROPONINI 0.06*    Glucose  Recent Labs Lab 03/16/16 0822 03/08/2016 1606 03/18/2016 2118 03/18/16 0023 03/18/16 0408 03/18/16 0724  GLUCAP 154* 249* 115* 257* 209* 180*    Imaging Dg Chest Port 1 View  Result Date: 02/27/2016 CLINICAL DATA:  Recent discharge for pneumonia.  Weakness.  COPD. EXAM: PORTABLE CHEST 1 VIEW COMPARISON:  03/08/2016 and chest CT 03/09/2016, chest x-ray 06/02/2015 FINDINGS: Right-sided pacemaker unchanged. Lungs are adequately inflated demonstrate persistent patchy nodular opacities over the mid to lower lungs unchanged compatible a recent CT findings of probable pulmonary metastatic disease. No focal airspace consolidation or effusion. Cardiomediastinal silhouette and remainder of the exam is unchanged. IMPRESSION: No acute cardiopulmonary disease. Stable patchy nodular opacities over the mid to lower lungs as seen on recent CT suggesting pulmonary metastatic disease. Electronically Signed   By: Marin Olp M.D.   On: 03/16/2016 17:36     STUDIES:  CXR 1/28 > No acute abnormalities. Stable patchy nodular opacities over the mid to lower lungs as seen on recent CT. EKG 1/28 > rate controlled atrial fibrillation, similar to previous EKG  CULTURES:  1/28 BCx > 1/28 RVP > 1/28 Sputum cx > 1/28 UCx >  ANTIBIOTICS: Vanc 1/28 >  Zosyn 1/28 >  SIGNIFICANT EVENTS: 1/28 admit to ICU  LINES/TUBES: PIV  DISCUSSION: 71 year old man with history of atrial fibrillation, COPD, DM2, recent diagnosis of pancreatic mass and likely metastatic disease to liver and lungs presenting with acute encephalopathy, dyspnea, cough. Recently hospitalized at [facility] on 1/19 for multifocal bronchopneumonia. Unable to obtain liver bx by IR due to elevated INR.   ASSESSMENT / PLAN:  PULMONARY A: Metastatic disease (see below) Recent  multilobar PNA with sepsis: was d/c'd with Ceftin COPD P:   Supplemental oxygen prn to maintain O2 sat >88% Scheduled duonebs Solumedrol daily  CARDIOVASCULAR A:  Elevated troponin> 0.05 Atrial fibrillation> anticoagulation on hold for bx P:  Trend trops  RENAL A:   Metabolic acidosis with increased anion gap > ABG pH 7.33, pCO2 24.7, bicarb 15, gap 21 on admission, improving Lactic acidosis > 11.31 on admission, resolving down to 4.5 Acute Hyponatremia > 131 on admission, improving to 134 Hyperkalemia > K 6 on admission, improved to 5.4 AKI > Cr 2.56 on admission. Baseline around 1. 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 Was referred to [facility] > Alk phos up to 401 from 308, AST 406 from 268, ALT 262 from 159. Bilirubin 30 from 13.  Hemorrhoids P:   Consult IR Was seen by Dr. Oneida Alar (GI) while at [facility]  Wife does not understand severity of his disease - will have palliative consulted for [name] discussion  HEMATOLOGIC A:   Anemia of chronic disease > Hgb 8.5, previously 9.5 Leukocytosis 16.9, downtrending Coagulopathy > PT 28 and INR 2.56 P:  65m vitamin K daily x 3  Follow PT/INR  INFECTIOUS A:   Possible HCAP, COPD exacerbation P:   1/28 BCx > 1/28 RVP > 1/28 Sputum cx > 1/28 UCx > Vanc 1/28 >  Zosyn 1/28 > Follow PCT  ENDOCRINE A:   DM2  Hypothyroidism  P:   SSI Continue home levothyroxine Start Levemir 10u BID Check TSH and HgbA1c  NEUROLOGIC A:   Acute encephalopathy > Ammonia 45 ?Dementia P:   RASS goal: 0 Consider more lactulose Continue home nortriptyline and trazodone Consider Haldol - will need to follow QTc  FAMILY  - Updates: Wife and son at bedside 1/28.   - Inter-disciplinary family meet or Palliative Care meeting due by:  2/4   JJacques Earthly MD  Internal Medicine  PGY-3 Pager: [phone] no answer or after 3PM: [phone] 03/18/2016, 7:31 AM   ATTENDING NOTE / ATTESTATION NOTE :   I have discussed the case with the resident/APP  Dr. JJacques Earthly   I agree with the resident/APP's  history, physical examination, assessment, and plans.    I have edited the above note and modified it according to our agreed history, physical examination, assessment and plan.   Briefly, pt with several medical issues, admitted recently for PNA, came back for AMS, generalized weakness.Work up revealed pancreatic mass with mets to liver and lung. Treated as possible sepsis, got IVF.  Not on pressors. With demand ischemia and mild AECOPD. Also with lactic acidosis, AKI, hyperK.   Vitals:  Vitals:   03/18/16 0700 03/18/16 0800 03/18/16 0820 03/18/16 0900  BP: 108/62 127/78  132/70  Pulse: 80  77  79  Resp: (!) 26 18  19   Temp:  98.5 F (36.9 C)    TempSrc:  Oral    SpO2: 100% 100% 100% 100%  Weight:      Height:        Constitutional/General:  Pleasant, well-nourished, well-developed, not in any distress,  Comfortably seating.  Well kempt  Body mass index is 28.55 kg/m. Wt Readings from Last 3 Encounters:  03/18/16 95.5 kg (210 lb 8.6 oz)  03/08/16 94.9 kg (209 lb 4.8 oz)  11/15/15 97.3 kg (214 lb 9.9 oz)    Neck circumference:   HEENT: Pupils equal and reactive to light and accommodation. Anicteric sclerae. Normal nasal mucosa.   No oral  lesions,  mouth clear,  oropharynx clear, no postnasal drip. (-) Oral thrush. No dental caries.  Airway - Mallampati class III  Neck: No masses. Midline trachea. No JVD, (-) LAD. (-) bruits appreciated.  Respiratory/Chest: Grossly normal chest. (-) deformity. (-) Accessory muscle use.  Symmetric expansion. (-) Tenderness on palpation.  Resonant on percussion.  Diminished BS on both lower lung zones. (-) wheezing, crackles, rhonchi (-) egophony  Cardiovascular: Regular rate and  rhythm, heart  sounds normal, no murmur or gallops, no peripheral edema  Gastrointestinal:  Normal bowel sounds. Soft, non-tender. No hepatosplenomegaly.  (-) masses.   Musculoskeletal:  Normal muscle tone. Normal gait.   Extremities: Grossly normal. (-) clubbing, cyanosis.  (-) edema  Skin: (-) rash,lesions seen.   Neurological/Psychiatric : alert, oriented to time, place, person. Normal mood and affect    CBC Recent Labs     02/22/2016  1609  03/18/16  0347  WBC  16.9*  15.2*  HGB  8.5*  7.6*  HCT  27.9*  25.5*  PLT  95*  66*    Coag's Recent Labs     03/11/2016  1609  03/18/16  0347  INR  2.56  2.51    BMET Recent Labs     03/15/2016  1609  02/26/2016  2133  03/18/16  0347  NA  131*  134*  134*  K  6.0*  5.4*  5.4*  CL  95*  98*  100*  CO2  15*  16*  17*  BUN  100*  96*  96*  CREATININE  2.56*  2.23*  2.11*  GLUCOSE  255*  276*  237*    Electrolytes Recent Labs     02/26/2016  1609  03/11/2016  2133  03/18/16  0347  CALCIUM  8.5*  8.0*  7.9*  MG   --    --   2.8*  PHOS   --    --   7.6*    Sepsis Markers Recent Labs     03/10/2016  2133  PROCALCITON  4.35    ABG Recent Labs     03/08/2016  1740  PHART  7.329*  PCO2ART  24.7*  PO2ART  96.0    Liver Enzymes Recent Labs     03/12/2016  1609  03/18/16  0347  AST  406*  516*  ALT  262*  262*  ALKPHOS  401*  388*  BILITOT  30.3*  32.3*  ALBUMIN  2.5*  2.2*    Cardiac Enzymes Recent Labs     03/18/16  0347  03/18/16  0856  TROPONINI  0.06*  0.08*    Glucose Recent Labs     03/16/16  0539  02/27/2016  1606  03/06/2016  2118  03/18/16  0023  03/18/16  0408  03/18/16  0724  GLUCAP  154*  249*  115*  257*  209*  180*    Imaging Dg Chest Port 1 View  Result Date: 02/28/2016 CLINICAL DATA:  Recent discharge for pneumonia.  Weakness.  COPD. EXAM: PORTABLE CHEST 1 VIEW COMPARISON:  03/08/2016 and chest CT 03/09/2016, chest x-ray 06/02/2015 FINDINGS: Right-sided pacemaker unchanged. Lungs are adequately  inflated demonstrate persistent patchy nodular opacities over the mid to lower lungs unchanged compatible a recent CT findings of probable pulmonary metastatic disease. No focal airspace consolidation or effusion. Cardiomediastinal silhouette and remainder of the exam is unchanged. IMPRESSION: No acute cardiopulmonary disease. Stable patchy nodular opacities over the mid to lower lungs as seen on recent CT suggesting pulmonary metastatic disease. Electronically Signed   By: Marin Olp M.D.   On: 03/07/2016 17:36   Assesment and Plan : Likely pancreatic CA with mets to liver and lungs. Noted plans for IR to do liver bx today. Discussed with with re: increased risk of bleeding.  Recommended holding off on Biopsy > son agrees. Palliative care to meet with family today to duiscuss [place].  Does not seem to be in pain. Maybe morphine prn if ever.   Lactic acidosis, better. Likely 2/2 above.   AKI 2/2 above + Poor PO intake. Cont IVF. Dec to 75 mls/hr. Will Rx hyperkalemia.   Concern for sepsis.  On Vanc and zosyn pending cultures.   I spent   minutes of Critical Care time with this patient today. This is my time spent independent of the APP or resident.   Family :Family updated at length today.  Keep in icu pending meeting.    Monica Becton, MD 03/18/2016, 10:34 AM [facility] Pager [phone] After 3 pm or if no answer, call [phone]

## 2016-03-18 NOTE — Care Management (Signed)
CM contacted by [facility] transfer coordinator Margarita Grizzle;  CM provided clinical update and due to palliative [place] meeting scheduled for today - [facility] is not currently requesting [facility] form.  [place] to follow back up with CM 03/19/16

## 2016-03-18 NOTE — Consult Note (Addendum)
[place] Nurse wound consult note Reason for Consult: right foot toe amputation site No family at the bedside to obtain any further history on his surgery on this site Wound type: surgical Pressure Injury POA: No Measurement:2.5cm x 0.5cm x 0.1cm  Wound bed: dark, not able to really visualize wound bed Drainage (amount, consistency, odor) none Periwound: legs are cool and mottled, pulses are weak but palpable  Dressing procedure/placement/frequency: Paint amputation site with betadine swab daily, allow to air dry.   Discussed POC  bedside nurse.  Re consult if needed, will not follow at this time. Thanks  Shams Fill [employer], RN,CWOCN, CNS [phone])

## 2016-03-18 NOTE — Progress Notes (Signed)
Lab called notifying nurse that PCT blood sample can not be determined due to bilirubin level greater than 30. CCM resident Dr. Randell Patient notified.

## 2016-03-18 NOTE — Consult Note (Signed)
Chief Complaint: Patient was seen in consultation today for transjugular liver lesion biopsy Chief Complaint  Patient presents with  . Altered Mental Status   at the request of Dr Bernita Raisin  Referring Physician(s): Dr Bernita Raisin  Supervising Physician: Arne Cleveland  Patient Status: [facility] - In-pt  History of Present Illness: Tony [name] is a 71 y.o. male   Altered mental status New diagnosis pancreatic mass with metastasis CT 1/20: IMPRESSION: 1. Pancreatic mass measures 4.9 cm consistent with primary pancreatic malignancy, characteristics suggesting adenocarcinoma. This lesion abuts the splenic artery. 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 2. Diffuse hepatic metastatic disease. Small volume perihepatic ascites. 3. Diffuse pulmonary metastatic disease with multiple pulmonary nodules. 4. Metastatic upper abdominal adenopathy. Spleen appears enlarged and heterogeneous but no discrete lesion is seen.  IR has been aware of this patient for several days now While an Inpt at [facility] this pt was scheduled with [place] for biopsy of liver lesion-  Per Dr Gala Romney and Neil Crouch PA INR remained too high for percutaneous liver lesion biopsy even with Vit K dosing for days.  Pt was discharged to home over weekend but was readmitted to [facility] with worsening mental status Cough and jaundice.  Now re order of liver lesion biopsy per MD INR 2.5 today: 1st dose 5 mg Vit K 1/28 - scheduled for 2 additional doses yet WBC 15.2 (on Prednisone) afeb  Dr Vernard Gambles has reviewed imaging and approves TRANSJUGULAR liver lesion biopsy attempt. Wife has consented via phone  Past Medical History:  Diagnosis Date  . Atrial fibrillation ([place])   . COPD (chronic obstructive pulmonary disease) ([place])   . Diabetes mellitus   . Neuropathy ([place])   . Pacemaker   . Pancreatic mass 03/10/2016  . PTSD (post-traumatic stress disorder)   . Spinal stenosis     Past Surgical History:  Procedure Laterality  Date  . CHOLECYSTECTOMY      Allergies: Patient has no known allergies.  Medications: Prior to Admission medications   Medication Sig Start Date End Date Taking? Authorizing Provider  albuterol (PROAIR HFA) 108 (90 Base) MCG/ACT inhaler Inhale 2 puffs into the lungs every 6 (six) hours as needed for wheezing or shortness of breath.   Yes Historical Provider, MD  cefUROXime (CEFTIN) 500 MG tablet Take 1 tablet (500 mg total) by mouth 2 (two) times daily with a meal. Patient taking differently: Take 500 mg by mouth 2 (two) times daily with a meal. 5 day course started 03/16/16 03/16/16  Yes Estela Leonie Green, MD  finasteride (PROSCAR) 5 MG tablet Take 5 mg by mouth at bedtime.    Yes Historical Provider, MD  glipiZIDE (GLUCOTROL) 10 MG tablet Take 20 mg by mouth daily as needed (if CBG >150).    Yes Historical Provider, MD  insulin glargine (LANTUS) 100 UNIT/ML injection Inject 15 Units into the skin daily as needed (CBG 200-225 15 units, >225 20 units). If BG > 200, inject 15 units If BG > 225, inject 20 units    Yes Historical Provider, MD  lactulose (CHRONULAC) 10 GM/15ML solution Take 10 g by mouth daily as needed for mild constipation.   Yes Historical Provider, MD  levothyroxine (SYNTHROID, LEVOTHROID) 100 MCG tablet Take 200 mcg by mouth daily before breakfast.    Yes Historical Provider, MD  nortriptyline (PAMELOR) 10 MG capsule Take 10 mg by mouth at bedtime.   Yes Historical Provider, MD  omeprazole (PRILOSEC) 20 MG capsule Take 20 mg  by mouth at bedtime.    Yes Historical Provider, MD  oxyCODONE (OXY IR/ROXICODONE) 5 MG immediate release tablet Take 5 mg by mouth 3 (three) times daily as needed for moderate pain.    Yes Historical Provider, MD  traZODone (DESYREL) 50 MG tablet Take 50 mg by mouth at bedtime as needed for sleep.    Yes Historical Provider, MD  fluticasone (FLONASE) 50 MCG/ACT nasal spray Place 1 spray into both nostrils daily as needed for allergies or rhinitis.     Historical Provider, MD  mometasone [facility]) 220 MCG/INH inhaler Inhale 2 puffs into the lungs 2 (two) times daily. May use at home medication    Historical Provider, MD  pregabalin (LYRICA) 100 MG capsule Take 200 mg by mouth 2 (two) times daily.     Historical Provider, MD     Family History  Problem Relation Age of Onset  . Heart disease Mother   . Prostate cancer Brother   . Kidney cancer Brother     Social History   Social History  . Marital status: Married    Spouse name: N/A  . Number of children: N/A  . Years of education: N/A   Social History Main Topics  . Smoking status: Never Smoker  . Smokeless tobacco: Never Used  . Alcohol use No  . Drug use: No  . Sexual activity: Yes    Birth control/ protection: None   Other Topics Concern  . None   Social History Narrative  . None     Review of Systems: A 12 point ROS discussed and pertinent positives are indicated in the HPI above.  All other systems are negative.  Review of Systems  Constitutional: Positive for activity change and appetite change. Negative for fever.  Respiratory: Positive for shortness of breath.   Cardiovascular: Negative for chest pain.  Gastrointestinal: Positive for abdominal pain.  Neurological: Positive for weakness.  Psychiatric/Behavioral: Positive for confusion. Negative for behavioral problems.    Vital Signs: BP 132/70   Pulse 79   Temp 98.5 F (36.9 C) (Oral)   Resp 19   Ht 6' (1.829 m)   Wt 210 lb 8.6 oz (95.5 kg)   SpO2 100%   BMI 28.55 kg/m   Physical Exam  Cardiovascular: Normal rate and regular rhythm.   Pulmonary/Chest: Effort normal and breath sounds normal.  Abdominal: Soft. Bowel sounds are normal.  Musculoskeletal:  Does not follow commands Moves all 4s spontaneously  Neurological:  Does nod yes to name ++ confusion  Skin: Skin is warm.  Psychiatric:  Consented with wife Vaughan Basta via phone  Nursing note and vitals reviewed.   Mallampati Score:  MD  Evaluation Airway: WNL Heart: WNL Abdomen: WNL Chest/ Lungs: WNL ASA  Classification: 3 Mallampati/Airway Score: Two  Imaging: Dg Chest 2 View  Result Date: 03/08/2016 CLINICAL DATA:  71 year old male with dyspnea on exertion. Cough and congestion for the past 2-3 days. EXAM: CHEST  2 VIEW COMPARISON:  Chest x-ray 06/02/2015. FINDINGS: Diffuse peribronchial cuffing. Ill-defined opacities in the region of the lingula and right middle lobe. No confluent consolidative airspace disease. No pleural effusions. No evidence of pulmonary edema. Heart size is normal. Atherosclerosis in the thoracic aorta. Upper mediastinal contours are within normal limits. Right-sided pacemaker device in place with lead tips projecting over the expected location of the right atrium and right ventricular apex. IMPRESSION: 1. Findings are compatible with bronchitis, potentially with multilobar bronchopneumonia involving the right middle lobe and lingula. Followup PA and lateral  chest X-ray is recommended in 3-4 weeks following trial of antibiotic therapy to ensure resolution and exclude underlying malignancy. 2. Aortic atherosclerosis. Electronically Signed   By: Vinnie Langton M.D.   On: 03/08/2016 13:01   Ct Head W & Wo Contrast  Result Date: 03/12/2016 CLINICAL DATA:  Metastatic pancreatic carcinoma. Confusion. History of atrial fibrillation, diabetes. EXAM: CT HEAD WITHOUT AND WITH CONTRAST TECHNIQUE: Contiguous axial images were obtained from the base of the skull through the vertex without and with intravenous contrast CONTRAST:  27mL ISOVUE-300 IOPAMIDOL (ISOVUE-300) INJECTION 61% COMPARISON:  CT HEAD June 12, 2013 FINDINGS: BRAIN: The ventricles and sulci are normal for age. No intraparenchymal hemorrhage, mass effect nor midline shift. Patchy supratentorial white matter hypodensities less than expected for patient's age, though non-specific are most compatible with chronic small vessel ischemic disease. No acute  large vascular territory infarcts. No abnormal extra-axial fluid collections. Basal cisterns are patent. VASCULAR: Unremarkable. SKULL: No skull fracture. No significant scalp soft tissue swelling. SINUSES/ORBITS: The mastoid air-cells and included paranasal sinuses are well-aerated.The included ocular globes and orbital contents are non-suspicious. OTHER: None. IMPRESSION: No intracranial metastasis. Normal CT HEAD with and without contrast for age. Electronically Signed   By: Elon Alas M.D.   On: 03/12/2016 20:12   Ct Chest W Contrast  Result Date: 03/09/2016 CLINICAL DATA:  Increased shortness of breath for several days. Elevated LFTs with multiple hepatic masses demonstrated on ultrasound. No known malignancy. EXAM: CT CHEST, ABDOMEN, AND PELVIS WITH CONTRAST TECHNIQUE: Multidetector CT imaging of the chest, abdomen and pelvis was performed following the standard protocol during bolus administration of intravenous contrast. CONTRAST:  172mL ISOVUE-300 IOPAMIDOL (ISOVUE-300) INJECTION 61% COMPARISON:  Right upper quadrant ultrasound earlier this day. Chest radiographs yesterday. Noncontrast abdominal CT 12/2015 15 FINDINGS: CT CHEST FINDINGS Cardiovascular: The heart is normal in size. Right-sided pacemaker in place. Thoracic aorta is normal in caliber with trace atherosclerosis of the arch. No filling defects in the central pulmonary arteries. Mediastinum/Nodes: Normal size subcarinal and pretracheal nodes. No pathologically enlarged mediastinal or hilar adenopathy. No axillary adenopathy. No pericardial fluid. Lungs/Pleura: Multifocal pulmonary nodules throughout all lobes of both lungs consistent with metastatic disease. The majority of these nodules are subcentimeter in size, however there is an ovoid 2.0 x 1.3 cm nodule in the perihilar left upper lobe series 3, image 76, with smooth margins. No confluent airspace disease. Mild central bronchial wall thickening. Trace right pleural thickening  without frank pleural effusion. Musculoskeletal: Remote fracture of posterior right seventh rib without callus, possible nonunion. No blastic or evidence of destructive lytic lesions. CT ABDOMEN PELVIS FINDINGS Hepatobiliary: Innumerable hypodense hepatic lesions throughout the liver which is enlarged consistent with metastatic disease. Index lesion in the subcapsular right lobe segment 5 measures 3.9 x 4.3 cm bi- axial measurements, appears confluent with adjacent lesions on coronal reformats. Clips in the gallbladder fossa postcholecystectomy. Pancreas: Ill-defined heterogeneous hypodense mass in the distal pancreatic body measures approximately 3.6 x 4.9 cm. Mild distal pancreatic ductal dilatation. This mass abuts the splenic artery. This is not about the superior mesenteric artery or vein. Spleen: Enlarged measuring 15.3 cm AP dimension, heterogeneous in density, without well-defined discrete lesion. The splenic vein is not visualized. Adrenals/Urinary Tract: No adrenal nodule. There are multiple bilateral water density lesions throughout both kidneys consistent with simple cysts. Additional small cortical hypodensities are too small to accurately characterize. No hydronephrosis. Symmetric excretion on delayed phase imaging. Stomach/Bowel: Small hiatal hernia. Stomach is physiologically distended. No bowel wall thickening, inflammation  or obstruction. Sigmoid and descending colonic diverticulosis without acute diverticulitis. The appendix is not confidently identified. Vascular/Lymphatic: Paraesophageal retrocrural node measures 11 mm. Multiple small lymph nodes at the origin of the celiac and SMA. A 10 mm node is noted in the aortocaval region of the level of the mid kidneys. There are enlarged nodes in the porta hepatis. Mild aorta bi-iliac atherosclerosis without aneurysm. The splenic vein is not visualized in its normal location, collateral vessels in the splenic hilum with a tortuous collateral vein  coursing anteriorly in the abdomen, joining abut the superior mesenteric vein just proximal to the portal confluence. Reproductive: Prostate is unremarkable. Other: Small amount of perihepatic ascites.  No free air. Musculoskeletal: Sclerotic focus within the L1 vertebral body is nonspecific, however appears unchanged from CT abdomen 01/04/2014 and is likely a bone island. No destructive lytic lesion. IMPRESSION: 1. Pancreatic mass measures 4.9 cm consistent with primary pancreatic malignancy, characteristics suggesting adenocarcinoma. This lesion abuts the splenic artery. 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 2. Diffuse hepatic metastatic disease. Small volume perihepatic ascites. 3. Diffuse pulmonary metastatic disease with multiple pulmonary nodules. 4. Metastatic upper abdominal adenopathy. Spleen appears enlarged and heterogeneous but no discrete lesion is seen. These results will be called to the ordering clinician or representative by the Radiologist Assistant, and communication documented in the PACS or zVision Dashboard. Electronically Signed   By: Jeb Levering M.D.   On: 03/09/2016 21:37   Ct Abdomen Pelvis W Contrast  Result Date: 03/09/2016 CLINICAL DATA:  Increased shortness of breath for several days. Elevated LFTs with multiple hepatic masses demonstrated on ultrasound. No known malignancy. EXAM: CT CHEST, ABDOMEN, AND PELVIS WITH CONTRAST TECHNIQUE: Multidetector CT imaging of the chest, abdomen and pelvis was performed following the standard protocol during bolus administration of intravenous contrast. CONTRAST:  158mL ISOVUE-300 IOPAMIDOL (ISOVUE-300) INJECTION 61% COMPARISON:  Right upper quadrant ultrasound earlier this day. Chest radiographs yesterday. Noncontrast abdominal CT 12/2015 15 FINDINGS: CT CHEST FINDINGS Cardiovascular: The heart is normal in size. Right-sided pacemaker in place. Thoracic aorta is normal in caliber with trace atherosclerosis of the arch. No filling defects  in the central pulmonary arteries. Mediastinum/Nodes: Normal size subcarinal and pretracheal nodes. No pathologically enlarged mediastinal or hilar adenopathy. No axillary adenopathy. No pericardial fluid. Lungs/Pleura: Multifocal pulmonary nodules throughout all lobes of both lungs consistent with metastatic disease. The majority of these nodules are subcentimeter in size, however there is an ovoid 2.0 x 1.3 cm nodule in the perihilar left upper lobe series 3, image 76, with smooth margins. No confluent airspace disease. Mild central bronchial wall thickening. Trace right pleural thickening without frank pleural effusion. Musculoskeletal: Remote fracture of posterior right seventh rib without callus, possible nonunion. No blastic or evidence of destructive lytic lesions. CT ABDOMEN PELVIS FINDINGS Hepatobiliary: Innumerable hypodense hepatic lesions throughout the liver which is enlarged consistent with metastatic disease. Index lesion in the subcapsular right lobe segment 5 measures 3.9 x 4.3 cm bi- axial measurements, appears confluent with adjacent lesions on coronal reformats. Clips in the gallbladder fossa postcholecystectomy. Pancreas: Ill-defined heterogeneous hypodense mass in the distal pancreatic body measures approximately 3.6 x 4.9 cm. Mild distal pancreatic ductal dilatation. This mass abuts the splenic artery. This is not about the superior mesenteric artery or vein. Spleen: Enlarged measuring 15.3 cm AP dimension, heterogeneous in density, without well-defined discrete lesion. The splenic vein is not visualized. Adrenals/Urinary Tract: No adrenal nodule. There are multiple bilateral water density lesions throughout both kidneys consistent with simple cysts. Additional  small cortical hypodensities are too small to accurately characterize. No hydronephrosis. Symmetric excretion on delayed phase imaging. Stomach/Bowel: Small hiatal hernia. Stomach is physiologically distended. No bowel wall thickening,  inflammation or obstruction. Sigmoid and descending colonic diverticulosis without acute diverticulitis. The appendix is not confidently identified. Vascular/Lymphatic: Paraesophageal retrocrural node measures 11 mm. Multiple small lymph nodes at the origin of the celiac and SMA. A 10 mm node is noted in the aortocaval region of the level of the mid kidneys. There are enlarged nodes in the porta hepatis. Mild aorta bi-iliac atherosclerosis without aneurysm. The splenic vein is not visualized in its normal location, collateral vessels in the splenic hilum with a tortuous collateral vein coursing anteriorly in the abdomen, joining abut the superior mesenteric vein just proximal to the portal confluence. Reproductive: Prostate is unremarkable. Other: Small amount of perihepatic ascites.  No free air. Musculoskeletal: Sclerotic focus within the L1 vertebral body is nonspecific, however appears unchanged from CT abdomen 01/04/2014 and is likely a bone island. No destructive lytic lesion. IMPRESSION: 1. Pancreatic mass measures 4.9 cm consistent with primary pancreatic malignancy, characteristics suggesting adenocarcinoma. This lesion abuts the splenic artery. 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 2. Diffuse hepatic metastatic disease. Small volume perihepatic ascites. 3. Diffuse pulmonary metastatic disease with multiple pulmonary nodules. 4. Metastatic upper abdominal adenopathy. Spleen appears enlarged and heterogeneous but no discrete lesion is seen. These results will be called to the ordering clinician or representative by the Radiologist Assistant, and communication documented in the PACS or zVision Dashboard. Electronically Signed   By: Jeb Levering M.D.   On: 03/09/2016 21:37   Dg Chest Port 1 View  Result Date: 02/23/2016 CLINICAL DATA:  Recent discharge for pneumonia.  Weakness.  COPD. EXAM: PORTABLE CHEST 1 VIEW COMPARISON:  03/08/2016 and chest CT 03/09/2016, chest x-ray 06/02/2015 FINDINGS:  Right-sided pacemaker unchanged. Lungs are adequately inflated demonstrate persistent patchy nodular opacities over the mid to lower lungs unchanged compatible a recent CT findings of probable pulmonary metastatic disease. No focal airspace consolidation or effusion. Cardiomediastinal silhouette and remainder of the exam is unchanged. IMPRESSION: No acute cardiopulmonary disease. Stable patchy nodular opacities over the mid to lower lungs as seen on recent CT suggesting pulmonary metastatic disease. Electronically Signed   By: Marin Olp M.D.   On: 02/27/2016 17:36   US Abdomen Limited Ruq  Result Date: 03/09/2016 CLINICAL DATA:  Elevated LFTs EXAM: US ABDOMEN LIMITED - RIGHT UPPER QUADRANT COMPARISON:  CT abdomen 01/04/2014 FINDINGS: Gallbladder: Prior cholecystectomy. Common bile duct: Diameter: 4.4 mm Liver: Heterogeneous echotexture with overall hepatic enlargement. Numerous hepatic masses throughout the liver with the largest in the right hepatic lobe measuring 3.9 x 4.1 x 5 cm. IMPRESSION: 1. Enlarged liver with multiple hepatic masses most concerning for metastatic disease. Recommend further evaluation with CT or MRI of the abdomen. Electronically Signed   By: Kathreen Devoid   On: 03/09/2016 09:55    Labs:  CBC:  Recent Labs  03/12/16 0448 03/13/16 0431 03/16/2016 1609 03/18/16 0347  WBC 11.5* 15.9* 16.9* 15.2*  HGB 9.3* 9.5* 8.5* 7.6*  HCT 30.0* 30.7* 27.9* 25.5*  PLT 192 198 95* 66*    COAGS:  Recent Labs  03/09/16 0656  03/14/16 0555 03/15/16 0447 03/08/2016 1609 03/18/16 0347  INR 2.04  < > 1.83 1.82 2.56 2.51  APTT 66*  --   --   --   --   --   < > = values in this interval not displayed.  BMP:  Recent Labs  03/14/16 0555 02/29/2016 1609 02/26/2016 2133 03/18/16 0347  NA 135 131* 134* 134*  K 4.5 6.0* 5.4* 5.4*  CL 98* 95* 98* 100*  CO2 23 15* 16* 17*  GLUCOSE 158* 255* 276* 237*  BUN 52* 100* 96* 96*  CALCIUM 8.7* 8.5* 8.0* 7.9*  CREATININE 1.03 2.56* 2.23*  2.11*  GFRNONAA >60 24* 28* 30*  GFRAA >60 28* 33* 35*    LIVER FUNCTION TESTS:  Recent Labs  03/13/16 0431 03/14/16 0555 03/01/2016 1609 03/18/16 0347  BILITOT 13.6* 13.8* 30.3* 32.3*  AST 276* 268* 406* 516*  ALT 141* 159* 262* 262*  ALKPHOS 345* 308* 401* 388*  PROT 6.4* 5.9* 5.9* 5.3*  ALBUMIN 3.0* 2.8* 2.5* 2.2*    TUMOR MARKERS:  Recent Labs  03/12/16 0448  CEA 1,958.0*  CA199 See below.    Assessment and Plan:  New pancreatic mass; Liver metastasis Need tissue diagnosis Scheduled for TJ liver lesion biopsy (INR 2.5) Risks and Benefits discussed with the patient including, but not limited to bleeding, infection, damage to adjacent structures or low yield requiring additional tests. All of the patient's questions were answered, patient is agreeable to proceed. Consent signed and in chart.   Thank you for this interesting consult.  I greatly enjoyed meeting Tony [name] and look forward to participating in their care.  A copy of this report was sent to the requesting provider on this date.  Electronically Signed: Monia Sabal A 03/18/2016, 9:44 AM   I spent a total of 40 Minutes    in face to face in clinical consultation, greater than 50% of which was counseling/coordinating care for liver lesion biopsy

## 2016-03-19 ENCOUNTER — Inpatient Hospital Stay (HOSPITAL_COMMUNITY): Payer: Non-veteran care

## 2016-03-19 DIAGNOSIS — R4182 Altered mental status, unspecified: Secondary | ICD-10-CM

## 2016-03-19 DIAGNOSIS — J9601 Acute respiratory failure with hypoxia: Secondary | ICD-10-CM

## 2016-03-19 LAB — COMPREHENSIVE METABOLIC PANEL
ALT: 287 U/L — ABNORMAL HIGH (ref 17–63)
ANION GAP: 17 — AB (ref 5–15)
AST: 538 U/L — ABNORMAL HIGH (ref 15–41)
Albumin: 2.3 g/dL — ABNORMAL LOW (ref 3.5–5.0)
Alkaline Phosphatase: 478 U/L — ABNORMAL HIGH (ref 38–126)
BUN: 106 mg/dL — ABNORMAL HIGH (ref 6–20)
CALCIUM: 8.6 mg/dL — AB (ref 8.9–10.3)
CHLORIDE: 106 mmol/L (ref 101–111)
CO2: 19 mmol/L — AB (ref 22–32)
Creatinine, Ser: 2.13 mg/dL — ABNORMAL HIGH (ref 0.61–1.24)
GFR calc non Af Amer: 30 mL/min — ABNORMAL LOW (ref >60)
GFR, EST AFRICAN AMERICAN: 34 mL/min — AB (ref >60)
Glucose, Bld: 106 mg/dL — ABNORMAL HIGH (ref 65–99)
Potassium: 5.1 mmol/L (ref 3.5–5.1)
SODIUM: 142 mmol/L (ref 135–145)
Total Bilirubin: 42.2 mg/dL (ref 0.3–1.2)
Total Protein: 6.2 g/dL — ABNORMAL LOW (ref 6.5–8.1)

## 2016-03-19 LAB — POCT I-STAT 3, ART BLOOD GAS (G3+)
Acid-base deficit: 14 mmol/L — ABNORMAL HIGH (ref 0.0–2.0)
BICARBONATE: 12.4 mmol/L — AB (ref 20.0–28.0)
O2 Saturation: 100 %
PO2 ART: 310 mmHg — AB (ref 83.0–108.0)
Patient temperature: 98.5
TCO2: 13 mmol/L (ref 0–100)
pCO2 arterial: 28.3 mmHg — ABNORMAL LOW (ref 32.0–48.0)
pH, Arterial: 7.249 — ABNORMAL LOW (ref 7.350–7.450)

## 2016-03-19 LAB — CBC
HEMATOCRIT: 30.9 % — AB (ref 39.0–52.0)
HEMOGLOBIN: 9 g/dL — AB (ref 13.0–17.0)
MCH: 34 pg (ref 26.0–34.0)
MCHC: 29.1 g/dL — ABNORMAL LOW (ref 30.0–36.0)
MCV: 116.6 fL — ABNORMAL HIGH (ref 78.0–100.0)
PLATELETS: 85 10*3/uL — AB (ref 150–400)
RBC: 2.65 MIL/uL — ABNORMAL LOW (ref 4.22–5.81)
RDW: 29.3 % — ABNORMAL HIGH (ref 11.5–15.5)
WBC: 23.8 10*3/uL — AB (ref 4.0–10.5)

## 2016-03-19 LAB — MAGNESIUM
MAGNESIUM: 3.2 mg/dL — AB (ref 1.7–2.4)
Magnesium: 3.2 mg/dL — ABNORMAL HIGH (ref 1.7–2.4)

## 2016-03-19 LAB — BASIC METABOLIC PANEL
ANION GAP: 25 — AB (ref 5–15)
BUN: 121 mg/dL — ABNORMAL HIGH (ref 6–20)
CALCIUM: 8.1 mg/dL — AB (ref 8.9–10.3)
CO2: 11 mmol/L — ABNORMAL LOW (ref 22–32)
Chloride: 105 mmol/L (ref 101–111)
Creatinine, Ser: 3.19 mg/dL — ABNORMAL HIGH (ref 0.61–1.24)
GFR, EST AFRICAN AMERICAN: 21 mL/min — AB (ref >60)
GFR, EST NON AFRICAN AMERICAN: 18 mL/min — AB (ref >60)
Glucose, Bld: 139 mg/dL — ABNORMAL HIGH (ref 65–99)
POTASSIUM: 6.8 mmol/L — AB (ref 3.5–5.1)
SODIUM: 141 mmol/L (ref 135–145)

## 2016-03-19 LAB — PROTIME-INR
INR: 2.51
Prothrombin Time: 27.6 seconds — ABNORMAL HIGH (ref 11.4–15.2)

## 2016-03-19 LAB — GLUCOSE, CAPILLARY
GLUCOSE-CAPILLARY: 103 mg/dL — AB (ref 65–99)
GLUCOSE-CAPILLARY: 85 mg/dL (ref 65–99)
GLUCOSE-CAPILLARY: 115 mg/dL — AB (ref 65–99)
Glucose-Capillary: 107 mg/dL — ABNORMAL HIGH (ref 65–99)
Glucose-Capillary: 113 mg/dL — ABNORMAL HIGH (ref 65–99)
Glucose-Capillary: 121 mg/dL — ABNORMAL HIGH (ref 65–99)

## 2016-03-19 LAB — URINE CULTURE: Culture: NO GROWTH

## 2016-03-19 LAB — HEMOGLOBIN A1C
Hgb A1c MFr Bld: <4.2 % — ABNORMAL LOW (ref 4.8–5.6)
Mean Plasma Glucose: <74 mg/dL

## 2016-03-19 LAB — TRIGLYCERIDES
TRIGLYCERIDES: 329 mg/dL — AB (ref <150)
Triglycerides: 300 mg/dL — ABNORMAL HIGH (ref <150)

## 2016-03-19 LAB — AMMONIA: Ammonia: 51 umol/L — ABNORMAL HIGH (ref 9–35)

## 2016-03-19 LAB — PHOSPHORUS: PHOSPHORUS: 8.2 mg/dL — AB (ref 2.5–4.6)

## 2016-03-19 MED ORDER — ASPIRIN 81 MG PO CHEW: 324.0000 mg | CHEWABLE_TABLET | ORAL | Status: DC

## 2016-03-19 MED ORDER — FENTANYL CITRATE (PF) 100 MCG/2ML IJ SOLN
100.0000 ug | Freq: Once | INTRAMUSCULAR | Status: AC
  Administered 2016-03-19: 100 ug via INTRAVENOUS

## 2016-03-19 MED ORDER — SODIUM CHLORIDE 0.9% FLUSH
10.0000 mL | Freq: Two times a day (BID) | INTRAVENOUS | Status: DC
Start: 2016-03-19 — End: 2016-03-20
  Administered 2016-03-19: 10 mL

## 2016-03-19 MED ORDER — FENTANYL CITRATE (PF) 100 MCG/2ML IJ SOLN: 50.0000 ug | INTRAMUSCULAR | Status: DC | PRN

## 2016-03-19 MED ORDER — ASPIRIN 300 MG RE SUPP: 300.0000 mg | RECTAL | Status: DC

## 2016-03-19 MED ORDER — CHLORHEXIDINE GLUCONATE 0.12% ORAL RINSE (MEDLINE KIT)
15.0000 mL | Freq: Two times a day (BID) | OROMUCOSAL | Status: DC
  Administered 2016-03-19: 15 mL via OROMUCOSAL

## 2016-03-19 MED ORDER — SODIUM CHLORIDE 0.9 % IV SOLN
2.0000 g | Freq: Once | INTRAVENOUS | Status: DC
  Filled 2016-03-19: qty 20

## 2016-03-19 MED ORDER — SODIUM CHLORIDE 0.9 % IV SOLN
250.0000 mL | INTRAVENOUS | Status: DC | PRN
Start: 2016-03-19 — End: 2016-03-20

## 2016-03-19 MED ORDER — SODIUM BICARBONATE 8.4 % IV SOLN
INTRAVENOUS | Status: DC
  Filled 2016-03-19: qty 1700

## 2016-03-19 MED ORDER — ETOMIDATE 2 MG/ML IV SOLN
20.0000 mg | Freq: Once | INTRAVENOUS | Status: AC
  Administered 2016-03-19: 20 mg via INTRAVENOUS

## 2016-03-19 MED ORDER — NOREPINEPHRINE BITARTRATE 1 MG/ML IV SOLN
0.0000 ug/min | INTRAVENOUS | Status: DC
  Administered 2016-03-19: 12 ug/min via INTRAVENOUS
  Administered 2016-03-19: 10 ug/min via INTRAVENOUS
  Filled 2016-03-19 (×2): qty 4

## 2016-03-19 MED ORDER — SODIUM BICARBONATE 8.4 % IV SOLN
100.0000 meq | Freq: Once | INTRAVENOUS | Status: AC
  Administered 2016-03-19: 100 meq via INTRAVENOUS

## 2016-03-19 MED ORDER — MIDAZOLAM HCL 2 MG/2ML IJ SOLN
2.0000 mg | Freq: Once | INTRAMUSCULAR | Status: AC
  Administered 2016-03-19: 2 mg via INTRAVENOUS

## 2016-03-19 MED ORDER — VASOPRESSIN 20 UNIT/ML IV SOLN
0.0300 [IU]/min | INTRAVENOUS | Status: DC
  Administered 2016-03-19: 0.03 [IU]/min via INTRAVENOUS
  Filled 2016-03-19 (×2): qty 2

## 2016-03-19 MED ORDER — LACTULOSE 10 GM/15ML PO SOLN
30.0000 g | Freq: Three times a day (TID) | ORAL | Status: DC
  Administered 2016-03-19 (×2): 30 g via ORAL
  Filled 2016-03-19 (×2): qty 45

## 2016-03-19 MED ORDER — PROPOFOL 1000 MG/100ML IV EMUL
0.0000 ug/kg/min | INTRAVENOUS | Status: DC
  Administered 2016-03-19: 25 ug/kg/min via INTRAVENOUS
  Administered 2016-03-19: 15 ug/kg/min via INTRAVENOUS
  Filled 2016-03-19 (×2): qty 100

## 2016-03-19 MED ORDER — FENTANYL CITRATE (PF) 100 MCG/2ML IJ SOLN
INTRAMUSCULAR | Status: AC
  Filled 2016-03-19: qty 2

## 2016-03-19 MED ORDER — ATROPINE SULFATE 1 MG/10ML IJ SOSY
PREFILLED_SYRINGE | INTRAMUSCULAR | Status: AC
  Filled 2016-03-19: qty 10

## 2016-03-19 MED ORDER — INSULIN ASPART 100 UNIT/ML IV SOLN
10.0000 [IU] | Freq: Once | INTRAVENOUS | Status: AC
  Administered 2016-03-19: 10 [IU] via INTRAVENOUS

## 2016-03-19 MED ORDER — STERILE WATER FOR INJECTION IV SOLN
INTRAVENOUS | Status: DC
  Administered 2016-03-19: 17:00:00 via INTRAVENOUS
  Filled 2016-03-19 (×2): qty 850

## 2016-03-19 MED ORDER — DEXTROSE 5 % IV SOLN
2.0000 g | INTRAVENOUS | Status: DC
  Administered 2016-03-19: 2 g via INTRAVENOUS
  Filled 2016-03-19 (×2): qty 2

## 2016-03-19 MED ORDER — MIDAZOLAM HCL 2 MG/2ML IJ SOLN
INTRAMUSCULAR | Status: AC
  Filled 2016-03-19: qty 4

## 2016-03-19 MED ORDER — DEXTROSE 50 % IV SOLN
1.0000 | Freq: Once | INTRAVENOUS | Status: AC
  Administered 2016-03-19: 50 mL via INTRAVENOUS

## 2016-03-19 MED ORDER — STERILE WATER FOR INJECTION IV SOLN
INTRAVENOUS | Status: DC
  Filled 2016-03-19: qty 850

## 2016-03-19 MED ORDER — ORAL CARE MOUTH RINSE
15.0000 mL | OROMUCOSAL | Status: DC
Start: 2016-03-19 — End: 2016-03-20
  Administered 2016-03-19 (×2): 15 mL via OROMUCOSAL

## 2016-03-19 MED ORDER — DEXTROSE 50 % IV SOLN
INTRAVENOUS | Status: AC
  Administered 2016-03-19: 50 mL via INTRAVENOUS
  Filled 2016-03-19: qty 50

## 2016-03-19 MED ORDER — SODIUM CHLORIDE 0.9% FLUSH: 10.0000 mL | INTRAVENOUS | Status: DC | PRN

## 2016-03-19 NOTE — Progress Notes (Signed)
[place] Progress Note Patient Name: EVERARDO CROUSER DOB: Feb 22, 1945 MRN: [id number]   Date of Service  03/19/2016  HPI/Events of Note  Intermittent WCT with  Paced rhythm Dose of levo increasing  eICU Interventions  Check Lytes, add vasopressin     Intervention Category Major Interventions: Arrhythmia - evaluation and management Evaluation Type: Other  Flora Lipps 03/19/2016, 10:37 PM

## 2016-03-19 NOTE — Progress Notes (Signed)
Gretchen RN at [facility] of [facility], states she will let MD know as soon as they finish report.

## 2016-03-19 NOTE — Procedures (Signed)
Intubation Procedure Note Tony [name] [id number] [birth year]  Procedure: Intubation Indications: Airway protection and maintenance  Procedure Details Consent: Risks of procedure as well as the alternatives and risks of each were explained to the (patient/caregiver).  Consent for procedure obtained. Time Out: Verified patient identification, verified procedure, site/side was marked, verified correct patient position, special equipment/implants available, medications/allergies/relevent history reviewed, required imaging and test results available.  Performed  Maximum sterile technique was used including gloves, hand hygiene and mask.  MAC and 3  Pt intubated for airway protection.  He has a large tongue and lot of tissues at the back.  With gleidoscope use, was able to insert ET size 7.5.  Good color change.  Awaiting for CXR.   Received 20 mg of etomidate IV for the procedure.   Evaluation Hemodynamic Status: BP stable throughout; O2 sats: stable throughout Patient's Current Condition: stable Complications: No apparent complications Patient did tolerate procedure well. Chest X-ray ordered to verify placement.  CXR: pending.   [place] 03/19/2016

## 2016-03-19 NOTE — Progress Notes (Signed)
Critical abg results called to E-link/Dr Kasa. No new orders at this time. RT will continue to monitor.

## 2016-03-19 NOTE — Progress Notes (Signed)
[place] Progress Note Patient Name: Tony [name] DOB: 10-04-1945 MRN: [id number]   Date of Service  03/19/2016  HPI/Events of Note  abg post intubation 1 hr 7.24/28/310  eICU Interventions  Consider bicarb replacement therapy     Intervention Category Major Interventions: Respiratory failure - evaluation and management  Flora Lipps 03/19/2016, 4:11 PM

## 2016-03-19 NOTE — Procedures (Signed)
Intubation Procedure Note CHI GARLOW [id number] 09-Apr-1945  Procedure: Intubation Indications: Airway protection and maintenance  Procedure Details Consent: Unable to obtain consent because of altered level of consciousness. Time Out: Verified patient identification, verified procedure, site/side was marked, verified correct patient position, special equipment/implants available, medications/allergies/relevent history reviewed, required imaging and test results available.  Performed  Maximum sterile technique was used including gloves, gown, hand hygiene and mask.  MAC and 4    Evaluation Hemodynamic Status: BP stable throughout; O2 sats: stable throughout Patient's Current Condition: stable Complications: No apparent complications Patient did tolerate procedure well. Chest X-ray ordered to verify placement.  CXR: pending.  Pt intubated for airway protection using glidescope #4 blade with 7.5 ett secured at 25 at the lip. Bilateral Rhonchi/expiratory wheeze, direct visualization, positive color change noted on etco2, CXR pending.   RT will continue to monitor.  Jesse Sans 03/19/2016

## 2016-03-19 NOTE — Progress Notes (Signed)
Pharmacy Antibiotic Note  Tony [name] is a 71 y.o. male admitted on 03/16/2016 with pneumonia and sepsis. Initially started on broad-spectrum vancomycin and zosyn but now narrowing to ceftriaxone for SBP prophylaxis. Pt is afebrile but WBC is significantly elevated at 23.8.   Plan: Ceftriaxone 2gm IV Q24H F/u C&S, clinical status  Height: 6' (182.9 cm) Weight: 218 lb 7.6 oz (99.1 kg) IBW/kg (Calculated) : 77.6  Temp (24hrs), Avg:97.9 F (36.6 C), Min:97.5 F (36.4 C), Max:98.5 F (36.9 C)   Recent Labs Lab 03/13/16 0431  02/26/2016 1609 02/27/2016 1628 03/19/2016 2133 03/18/16 0016 03/18/16 0347 03/18/16 2049 03/19/16 0358  WBC 15.9*  --  16.9*  --   --   --  15.2*  --  23.8*  CREATININE 0.47*  < > 2.56*  --  2.23*  --  2.11* 1.89* 2.13*  LATICACIDVEN  --   --   --  11.31* 5.7* 4.5*  --   --   --   < > = values in this interval not displayed.  Estimated Creatinine Clearance: 39.3 mL/min (by C-G formula based on SCr of 2.13 mg/dL (H)).    No Known Allergies   Zosyn 1/28>>1/30 Vancomycin 1/28>>1/30 CTX 1/30>>  1/28 blood cx: ngtd 1/28 RVP >> metapneumovirus 1/28 MRSA PCR >> neg 1/28 UCx >> ng  Salome Arnt, PharmD, BCPS Pager # [phone] 03/19/2016 2:16 PM

## 2016-03-19 NOTE — Care Management Note (Signed)
Case Management Note  Patient Details  Name: Tony [name] MRN: [id number] Date of Birth: 1946-02-17  Subjective/Objective:   Pt receives primary care @ [facility].  CM talked with Jerlyn Ly, Transfer Coordinator, @ [phone] x [phone].  Mickel Baas stated she had received a call from pt's wife stating she wanted pt transferred to [facility].  Mickel Baas also stated [facility] had palliative care rep, Jory Ee x [phone], who would handle transfer if family decided they wanted palliative care.  Mickel Baas requested update after family made decision on continued acute vs palliative care.                     Expected Discharge Plan:  Acute to Acute Transfer  Discharge planning Services  CM Consult  Status of Service:  In process, will continue to follow  Girard Cooter, RN 03/19/2016, 2:37 PM

## 2016-03-19 NOTE — Progress Notes (Signed)
Dr Maretta Bees updated on rhythm, and SBP/MAP, orders received

## 2016-03-19 NOTE — Procedures (Signed)
Central Venous Catheter Insertion Procedure Note JIHAD DUFFIE [id number] 1945-04-25  Procedure: Insertion of Central Venous Catheter Indications: Assessment of intravascular volume, Drug and/or fluid administration and Frequent blood sampling  Procedure Details Consent: Risks of procedure as well as the alternatives and risks of each were explained to the (patient/caregiver).  Consent for procedure obtained. Time Out: Verified patient identification, verified procedure, site/side was marked, verified correct patient position, special equipment/implants available, medications/allergies/relevent history reviewed, required imaging and test results available.  Performed  Maximum sterile technique was used including antiseptics, cap, gloves, gown, hand hygiene, mask and sheet. Skin prep: Chlorhexidine; local anesthetic administered A antimicrobial bonded/coated triple lumen catheter was placed in the left internal jugular vein using the Seldinger technique.  Evaluation Blood flow good Complications: No apparent complications Patient did tolerate procedure well. Chest X-ray ordered to verify placement.  CXR: pending.  Procedure performed under direct ultrasound guidance for real time vessel cannulation.      Montey Hora, [facility] Pager: [phone]  or [phone] 03/19/2016, 3:43 PM

## 2016-03-19 NOTE — Progress Notes (Signed)
   Patient unresponsive.  If family requests chaplain presence, please page on-call chaplain or contact the Spiritual Care department.  Will follow, as needed.  - Rev. Woodland MDiv ThM

## 2016-03-19 NOTE — Progress Notes (Signed)
Patient to transfer to [place].  Report called to [facility].

## 2016-03-19 NOTE — Progress Notes (Signed)
PULMONARY / CRITICAL CARE MEDICINE   Name: Tony [name] MRN: [id number] DOB: 1945/08/01    ADMISSION DATE:  03/07/2016 CONSULTATION DATE:  1/28  REFERRING MD:  Rex Kras MD (EDP)  CHIEF COMPLAINT:  Acute encephalopathy  SUBJECTIVE:  Transferred to SDU overnight.  Has continued tachypnea and mild increase WOB (but no distress).  VITAL SIGNS: BP 116/67 (BP Location: Right Arm)   Pulse 98   Temp 97.6 F (36.4 C) (Axillary)   Resp (!) 26   Ht 6' (1.829 m)   Wt 99.1 kg (218 lb 7.6 oz)   SpO2 92%   BMI 29.63 kg/m   VENTILATOR SETTINGS:    INTAKE / OUTPUT: I/O last 3 completed shifts: In: 7270.4 [I.V.:5377.9; Other:1120; IV Piggyback:772.5] Out: 2160 [Urine:1360; Stool:800]  PHYSICAL EXAMINATION: General:  Mild respiratory distress Neuro:  No focal deficits HEENT:  EOMI, icteric sclera, dry mucous membranes Cardiovascular:  RRR Lungs:  Clear bilaterally, no wheezes, tachypneic Abdomen:  Soft, nontender, mild distention Musculoskeletal: 1 + non pitting edema to knees Skin:  Jaundiced  LABS:  BMET  Recent Labs Lab 03/18/16 0347 03/18/16 2049 03/19/16 0358  NA 134* 139 142  K 5.4* 5.6* 5.1  CL 100* 104 106  CO2 17* 17* 19*  BUN 96* 102* 106*  CREATININE 2.11* 1.89* 2.13*  GLUCOSE 237* 139* 106*    Electrolytes  Recent Labs Lab 03/18/16 0347 03/18/16 2049 03/19/16 0358  CALCIUM 7.9* 8.2* 8.6*  MG 2.8*  --  3.2*  PHOS 7.6*  --  8.2*    CBC  Recent Labs Lab 03/06/2016 1609 03/18/16 0347 03/19/16 0358  WBC 16.9* 15.2* 23.8*  HGB 8.5* 7.6* 9.0*  HCT 27.9* 25.5* 30.9*  PLT 95* 66* 85*    Coag's  Recent Labs Lab 03/16/2016 1609 03/18/16 0347 03/19/16 0358  INR 2.56 2.51 2.51    Sepsis Markers  Recent Labs Lab 02/29/2016 1628 02/27/2016 2133 03/18/16 0016  LATICACIDVEN 11.31* 5.7* 4.5*  PROCALCITON  --  4.35  --     ABG  Recent Labs Lab 03/12/2016 1740  PHART 7.329*  PCO2ART 24.7*  PO2ART 96.0    Liver Enzymes  Recent Labs Lab  02/29/2016 1609 03/18/16 0347 03/19/16 0358  AST 406* 516* 538*  ALT 262* 262* 287*  ALKPHOS 401* 388* 478*  BILITOT 30.3* 32.3* 42.2*  ALBUMIN 2.5* 2.2* 2.3*    Cardiac Enzymes  Recent Labs Lab 03/18/16 0347 03/18/16 0856  TROPONINI 0.06* 0.08*    Glucose  Recent Labs Lab 03/18/16 1112 03/18/16 1543 03/18/16 2014 03/19/16 0046 03/19/16 0433 03/19/16 0802  GLUCAP 149* 166* 127* 107* 103* 85    Imaging No results found.   STUDIES:  CXR 1/28 > No acute abnormalities. Stable patchy nodular opacities over the mid to lower lungs as seen on recent CT. EKG 1/28 > rate controlled atrial fibrillation, similar to previous EKG  CULTURES: 1/28 BCx > 1/28 RVP > metapneumovirus 1/28 Sputum cx > 1/28 UCx >  ANTIBIOTICS: Vanc 1/28 >  Zosyn 1/28 >  SIGNIFICANT EVENTS: 1/28 admit to ICU  LINES/TUBES: PIV  DISCUSSION: 71 year old man with history of atrial fibrillation, COPD, DM2, recent diagnosis of pancreatic mass and likely metastatic disease to liver and lungs presenting with acute encephalopathy, dyspnea, cough. Recently hospitalized at [facility] on 1/19 for multifocal bronchopneumonia and were unable to obtain liver bx by IR due to elevated INR.  IR planning for liver bx now at [facility] (held off 1/29 for family discussions) > wife wants aggressive care /  full code.  ASSESSMENT / PLAN:  PULMONARY A: Metastatic disease (see below) Recent multilobar PNA with sepsis: was d/c'd with Ceftin COPD Metapneumovirus URI P:   Supplemental oxygen prn to maintain O2 sat >88% Scheduled duonebs Solumedrol daily At risk for intubation - would STRONGLY recommend against this as would not change outcome  CARDIOVASCULAR A:  Atrial fibrillation> anticoagulation on hold for bx P:  Continue to hold anticoagulation  RENAL A:   Metabolic acidosis with increased anion gap - uremia, lactate Acute Hyponatremia > resolved Hyperkalemia > resolved AKI > Cr 2.56 on admission.  Baseline around 1. 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> liver bx not done due to elevated INR (was to f/u at [facility]), IR now planning for bx here Transaminitis > worsening Hemorrhoids P:   Have asked family to come to hospital today for continued goals of care discussions - depending on these discussions, will revisit liver bx (will recommend holding off to family along with DNR and hospice)  HEMATOLOGIC A:   Anemia of chronic disease Coagulopathy > PT 28 and INR 2.51 P:  96m vitamin K daily x 3  Follow PT/INR  INFECTIOUS A:   Possible HCAP, COPD exacerbation P:   Follow cultures as above Continue empiric abx (vanc / zosyn) Follow PCT - have been unable to draw due to hyperbilirubinemia  ENDOCRINE A:   DM2  Hypothyroidism  P:   SSI Continue home levothyroxine, IV formulation Start Levemir 10u BID Check TSH and HgbA1c  NEUROLOGIC A:   Acute encephalopathy > Ammonia 45 01/28 ?Dementia P:   RASS goal: 0 Consider more lactulose Continue home trazodone Consider Haldol - will need to follow QTc Hold home nortriptyline, pregabalin   FAMILY  - Updates: Wife and son updated over the phone 01/30 > have asked them to come in to hospital for continued goals of care discussions.  - Inter-disciplinary family meet or Palliative Care meeting due by:  2/4    RMontey Hora P[facility]stonPulmonary & Critical Care Medicine Pager: [phone] or [phone]/30/2018, 8:31 AM     ATTENDING NOTE / ATTESTATION NOTE :   I have discussed the case with the resident/APP  RMontey HoraPA.   I agree with the resident/APP's  history, physical examination, assessment, and plans.    I have edited the above note and modified it according to our agreed history, physical examination, assessment and plan.   Briefly, pt with several medical  issues (afib, DM, copd), admitted recently for PNA, came back for AMS 1 day  after discharge, generalized weakness.Work up revealed pancreatic mass with mets to liver and lung. Treated as possible sepsis, got IVF.  Not on pressors. With demand ischemia and mild AECOPD. Also with lactic acidosis, AKI, hyperK. [facility]  Overnight mental status was stable.  Last 6 hrs, noted to be more somnolent, drowsy, unable to protect airway.  I extensively d/w family re: over all prognosis.  They wanted to keep pt full code as they are trying to get hold of pt's estranged family.  Pt was transferred to 4N ICU.  He was intubated.  He just needed etomidate as he was "stiff" some. Remains confused, agitated at times.  BP 80 systolic post intubation.   Vitals:  Vitals:  03/19/16 1300 03/19/16 1345 03/19/16 1400 03/19/16 1440  BP: (!) 102/51  92/67   Pulse: 79  77   Resp: (!) 23  (!) 25   Temp:      TempSrc:      SpO2: 96% 100% 98% 100%  Weight:      Height:        Constitutional/General: well-nourished, well-developed, intubated, sedated, icteric/jaundiced.    Body mass index is 29.63 kg/m. Wt Readings from Last 3 Encounters:  03/19/16 99.1 kg (218 lb 7.6 oz)  03/08/16 94.9 kg (209 lb 4.8 oz)  11/15/15 97.3 kg (214 lb 9.9 oz)    HEENT: PERLA, icteric sclerae. (-) Oral thrush. Intubated, ETT in place  Neck: No masses. Midline trachea. No JVD, (-) LAD. (-) bruits appreciated.  Respiratory/Chest: Grossly normal chest. (-) deformity. (-) Accessory muscle use.  Symmetric expansion. Diminished BS on both lower lung zones. (-) wheezing, crackles, rhonchi (-) egophony  Cardiovascular: Regular rate and  rhythm, heart sounds normal, no murmur or gallops,  Trace peripheral edema  Gastrointestinal:  Dec bowel sounds. Soft, non-tender. Liver feels enlarged. (+) fluid wave  (-) masses.   Musculoskeletal:  Normal muscle tone.   Extremities: Grossly normal. (-)  clubbing, cyanosis.  Trace edema.   Skin: (-) rash,lesions seen.   Neurological/Psychiatric : sedated, intubated. CN grossly intact. (-) lateralizing signs.   CBC Recent Labs     03/14/2016  1609  03/18/16  0347  03/19/16  0358  WBC  16.9*  15.2*  23.8*  HGB  8.5*  7.6*  9.0*  HCT  27.9*  25.5*  30.9*  PLT  95*  66*  85*    Coag's Recent Labs     03/16/2016  1609  03/18/16  0347  03/19/16  0358  INR  2.56  2.51  2.51    BMET Recent Labs     03/18/16  0347  03/18/16  2049  03/19/16  0358  NA  134*  139  142  K  5.4*  5.6*  5.1  CL  100*  104  106  CO2  17*  17*  19*  BUN  96*  102*  106*  CREATININE  2.11*  1.89*  2.13*  GLUCOSE  237*  139*  106*    Electrolytes Recent Labs     03/18/16  0347  03/18/16  2049  03/19/16  0358  CALCIUM  7.9*  8.2*  8.6*  MG  2.8*   --   3.2*  PHOS  7.6*   --   8.2*    Sepsis Markers Recent Labs     03/09/2016  2133  PROCALCITON  4.35    ABG Recent Labs     03/14/2016  1740  PHART  7.329*  PCO2ART  24.7*  PO2ART  96.0    Liver Enzymes Recent Labs     02/28/2016  1609  03/18/16  0347  03/19/16  0358  AST  406*  516*  538*  ALT  262*  262*  287*  ALKPHOS  401*  388*  478*  BILITOT  30.3*  32.3*  42.2*  ALBUMIN  2.5*  2.2*  2.3*    Cardiac Enzymes Recent Labs     03/18/16  0347  03/18/16  0856  TROPONINI  0.06*  0.08*    Glucose Recent Labs     03/18/16  1543  03/18/16  2014  03/19/16  0046  03/19/16  0433  03/19/16  0802  03/19/16  [place]  127*  107*  103*  85  115*    Imaging Dg Chest Port 1 View  Result Date: 03/19/2016 CLINICAL DATA:  Recent discharge for pneumonia.  Weakness.  COPD. EXAM: PORTABLE CHEST 1 VIEW COMPARISON:  03/08/2016 and chest CT 03/09/2016, chest x-ray 06/02/2015 FINDINGS: Right-sided pacemaker unchanged. Lungs are adequately inflated demonstrate persistent patchy nodular opacities over the mid to lower lungs unchanged compatible a recent CT findings of probable  pulmonary metastatic disease. No focal airspace consolidation or effusion. Cardiomediastinal silhouette and remainder of the exam is unchanged. IMPRESSION: No acute cardiopulmonary disease. Stable patchy nodular opacities over the mid to lower lungs as seen on recent CT suggesting pulmonary metastatic disease. Electronically Signed   By: Marin Olp M.D.   On: 03/13/2016 17:36   Assesment and Plan :  Likely pancreatic CA with mets to liver and lungs. Liver Biopsy has held 2/2 increased risk of bleeding and pts INR is elevated and plt cnt is low. Palliative care has met with family.  I extensively discussed prognosis with wife and sons.  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  They also want to get in touch with pt's estranged family before they limit his code status.   Hepatic encephalopathy/ Liver failure/ Obstructive Jaundice 2/2 above.  Re start lactulose. Keep 3-5 BM.   Lactic acidosis, better. Likely 2/2 above.  Cont to observe.   AKI 2/2 above + Poor PO intake. Cont IVF at 42 mls/hr. Will need IVF bolus if BP becomes low with sedation.   Hyperkalemia. Improved.  Will observe.   Atrial fibrillation.  He was on [place] as oupt ( I was told) but that is on hold 2/2 elevated INR and low plt.   DM. Cont insulin SS and lantus.   Concern for sepsis. He was volume resuscitated.  There is no evidence for infection.  Will keep on Rocephin for SBP prophylaxis.  Will d/c vanc and zosyn. Lactic acid elevated 2/2 liver dse.     I spent  45  minutes of Critical Care time with this patient today. This is my time spent independent of the APP or resident.   Family :Family updated at length today extensively. Pt remains a full code.    Monica Becton, MD 03/19/2016, 3:14 PM [facility] Pager [phone] After 3 pm or if no answer, call [phone]

## 2016-03-19 NOTE — Progress Notes (Signed)
Daily Progress Note   Patient Name: Tony [name]       Date: 03/19/2016 DOB: 11-19-1945  Age: 71 y.o. MRN#: [id number] Attending Physician: Rush Landmark, MD Primary Care Physician: [place] Date: 03/12/2016  Reason for Consultation/Follow-up: Establishing goals of care and Psychosocial/spiritual support  Subjective: -continued conversation with family; patient is now intubated and moved to the ICU.   Prognosis is grim.  Discussed with family diagnosis, prognosis, GOC, EOL wishes and options.  -his wife tells me "this is in God's hands",  Several of her sons are struggling and finding it difficult to support their mother's decision for continued aggressive care when they see this as EOL and "don't want him to suffer"  -emotional support offered   Length of Stay: 2  Current Medications: Scheduled Meds:  . cefTRIAXone (ROCEPHIN)  IV  2 g Intravenous Q24H  . chlorhexidine  15 mL Mouth Rinse BID  . chlorhexidine gluconate (MEDLINE KIT)  15 mL Mouth Rinse BID  . insulin aspart  0-15 Units Subcutaneous Q4H  . insulin detemir  10 Units Subcutaneous BID  . ipratropium-albuterol  3 mL Nebulization Q6H  . lactulose  30 g Oral TID  . levothyroxine  100 mcg Intravenous Daily  . mouth rinse  15 mL Mouth Rinse q12n4p  . mouth rinse  15 mL Mouth Rinse 10 times per day  . methylPREDNISolone (SOLU-MEDROL) injection  40 mg Intravenous Daily  . pantoprazole (PROTONIX) IV  40 mg Intravenous Q24H    Continuous Infusions: . norepinephrine (LEVOPHED) Adult infusion 10 mcg/min (03/19/16 1552)  . propofol (DIPRIVAN) infusion 15 mcg/kg/min (03/19/16 1551)  .  sodium bicarbonate (isotonic) infusion in sterile water 75 mL/hr at 03/19/16 1726    PRN Meds: sodium chloride,  sodium chloride, albuterol, fentaNYL (SUBLIMAZE) injection, fentaNYL (SUBLIMAZE) injection  Physical Exam  Constitutional: He appears cachectic. He has a sickly appearance. He is intubated.  Cardiovascular: Tachycardia present.   Pulmonary/Chest: He is intubated.  - intubated, sedated, bilateral equal air movement   Neurological: He is unresponsive.  Skin:  -jaudice            Vital Signs: BP (!) 97/57   Pulse 77   Temp 98.1 F (36.7 C) (Oral)   Resp (!) 22   Ht 6' (1.829 m)  Wt 99.1 kg (218 lb 7.6 oz)   SpO2 98%   BMI 29.63 kg/m  SpO2: SpO2: 98 % O2 Device: O2 Device: Ventilator O2 Flow Rate: O2 Flow Rate (L/min): 15 L/min  Intake/output summary:  Intake/Output Summary (Last 24 hours) at 03/19/16 1923 Last data filed at 03/19/16 1700  Gross per 24 hour  Intake          1002.74 ml  Output              160 ml  Net           842.74 ml   LBM: Last BM Date: 03/19/16 Baseline Weight: Weight: 93 kg (205 lb) Most recent weight: Weight: 99.1 kg (218 lb 7.6 oz)       Palliative Assessment/Data:  10 %    Flowsheet Rows   Flowsheet Row Most Recent Value  Intake Tab  Referral Department  Critical care  Unit at Time of Referral  ICU  Palliative Care Primary Diagnosis  Cancer  Date Notified  03/18/16  Palliative Care Type  New Palliative care  Reason for referral  Clarify Goals of Care  Date of Admission  03/19/2016  Date first seen by Palliative Care  03/18/16  # of days Palliative referral response time  0 Day(s)  # of days IP prior to Palliative referral  1  Clinical Assessment  Palliative Performance Scale Score  20%  Psychosocial & Spiritual Assessment  Palliative Care Outcomes      Patient Active Problem List   Diagnosis Date Noted  . Respiratory failure ([place]) 03/19/2016  . Altered mental status   . DNR (do not resuscitate) discussion 03/18/2016  . Palliative care by specialist 03/18/2016  . Acute liver failure without hepatic coma   . Lactic acidosis   .  Acute encephalopathy 03/08/2016  . Malignancy ([place])   . Anemia   . Jaundice   . Liver masses 03/10/2016  . Lung mass 03/10/2016  . Pancreatic mass 03/10/2016  . Lobar pneumonia ([place]) 03/08/2016  . Elevated LFTs 03/08/2016  . H/O peripheral vascular disease 10/21/2013  . Sinoatrial node dysfunction ([place]) 10/21/2013  . Venous stasis ulcer of left leg 05/12/2012  . 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 Supratherapeutic INR 04/03/2012  . Diabetic ulcer of lower extremity ([place]) 04/02/2012  . Cellulitis of left lower extremity 04/02/2012  . Diabetic neuropathy ([place]) 01/13/2011  . Atrial fibrillation, chronic ([place]) 01/13/2011  . Gout 01/13/2011  . BPH (benign prostatic hyperplasia) 01/13/2011  . Hypothyroidism 01/13/2011  . Diabetes type 2, controlled ([place]) 01/13/2011  . COPD (chronic obstructive pulmonary disease) ([place]) 01/13/2011    Palliative Care Assessment & Plan    Assessment: -unfortunate situation, patient is transitioning at EOL and family requested full resuscitation.  He is intubated on full support but I suspect he will not survive 12 hours  Recommendations/Plan:  Ensure comfort for this patien within the context of GOCs of the family  Support family and help them navigate a difficlut sitaution, limitaions of medcial intervetnions and conceopt pf moratalitu   Code Status:    Code Status Orders        Start     Ordered   03/19/16 1407  Full code  Continuous     03/19/16 1407    Code Status History    Date Active Date Inactive Code Status Order ID Comments User Context   02/21/2016  6:39 PM 03/19/2016  2:07 PM Full Code [id number]  Milagros Loll, MD ED  03/08/2016  4:53 PM 03/16/2016  1:36 PM Full Code [id number]  Samuella Cota, MD Inpatient   11/15/2015  7:16 PM 11/17/2015  4:45 PM Full Code [id number]  Roxan Hockey, MD ED   08/23/2014  7:58 PM 08/27/2014  6:32 PM Full Code [id number]  Doree Albee, MD ED   12/12/2012 11:47 PM 12/15/2012  7:22 PM Full  Code [id number]  Oswald Hillock, MD Inpatient   05/12/2012  6:34 PM 05/14/2012  6:49 PM Full Code [id number]  Delfina Redwood, MD Inpatient   01/13/2011  9:52 PM 01/15/2011  2:54 PM Full Code [id number]  Fleet Contras Mabe, RN Inpatient       Prognosis:   Hours - Days  Discharge Planning:  Anticipated Hospital Death  Care plan was discussed with CCM providers  Thank you for allowing the Palliative Medicine Team to assist in the care of this patient.   Time In: 1600 Time Out: 1645 Total Time 45 min Prolonged Time Billed  no      Greater than 50%  of this time was spent counseling and coordinating care related to the above assessment and plan.  Wadie Lessen, NP  Please contact Palliative Medicine Team phone at [phone] for questions and concerns.

## 2016-03-20 LAB — GLUCOSE, CAPILLARY: GLUCOSE-CAPILLARY: 49 mg/dL — AB (ref 65–99)

## 2016-03-20 MED ORDER — NOREPINEPHRINE BITARTRATE 1 MG/ML IV SOLN
0.0000 ug/min | INTRAVENOUS | Status: DC
  Administered 2016-03-20: 50 ug/min via INTRAVENOUS
  Filled 2016-03-20: qty 16

## 2016-03-20 MED ORDER — DEXTROSE 50 % IV SOLN
1.0000 | Freq: Once | INTRAVENOUS | Status: AC
  Administered 2016-03-20: 50 mL via INTRAVENOUS

## 2016-03-20 MED ORDER — DEXTROSE 50 % IV SOLN
1.0000 | Freq: Once | INTRAVENOUS | Status: AC
  Administered 2016-03-20: 50 mL via INTRAVENOUS
  Filled 2016-03-20: qty 50

## 2016-03-20 MED ORDER — CALCIUM CHLORIDE 10 % IV SOLN
1.0000 g | Freq: Once | INTRAVENOUS | Status: AC
Start: 2016-03-20 — End: 2016-03-19
  Administered 2016-03-19: 1 g via INTRAVENOUS

## 2016-03-20 MED ORDER — SODIUM BICARBONATE 8.4 % IV SOLN
INTRAVENOUS | Status: AC
  Administered 2016-03-19: 100 meq via INTRAVENOUS
  Filled 2016-03-20: qty 150

## 2016-03-20 MED ORDER — SODIUM BICARBONATE 8.4 % IV SOLN
100.0000 meq | Freq: Once | INTRAVENOUS | Status: AC
  Administered 2016-03-20: 100 meq via INTRAVENOUS

## 2016-03-20 MED ORDER — SODIUM POLYSTYRENE SULFONATE 15 GM/60ML PO SUSP
30.0000 g | Freq: Once | ORAL | Status: AC
  Administered 2016-03-20: 30 g via ORAL
  Filled 2016-03-20: qty 120

## 2016-03-20 MED ORDER — DOPAMINE-DEXTROSE 3.2-5 MG/ML-% IV SOLN
0.0000 ug/kg/min | INTRAVENOUS | Status: DC
  Administered 2016-03-20: 10 ug/kg/min via INTRAVENOUS

## 2016-03-20 MED FILL — Medication: Qty: 1 | Status: AC

## 2016-03-21 NOTE — Progress Notes (Signed)
RRT manually ventilated patient 100% FiO2  throughout CODE. ACLS protocol initiated. Time of death 0116 per NP/MD

## 2016-03-21 NOTE — Progress Notes (Signed)
[facility] Progress Note Patient Name: Tony [name] DOB: 1945-09-24 MRN: [id number]   Date of Service  April 08, 2016  HPI/Events of Note  Ca++ 1 gm IV given from code cart during the code.   eICU Interventions  Will D/C Calcium Gluconate 2 gm IV order.     Intervention Category Major Interventions: Electrolyte abnormality - evaluation and management  Sommer,Steven Eugene 04/08/2016, 12:36 AM

## 2016-03-21 NOTE — Discharge Summary (Signed)
PULMONARY / CRITICAL CARE MEDICINE   Name: Tony [name] MRN: [id number] DOB: 06-15-1945    ADMISSION DATE:  03/16/2016 CONSULTATION DATE:  1/28  REFERRING MD:  Rex Kras MD (EDP)  CHIEF COMPLAINT:  Acute encephalopathy  HISTORY OF PRESENT ILLNESS:   71 year old man with history of atrial fibrillation, COPD, DM2, recent diagnosis of pancreatic mass presenting with acute encephalopathy. Most of history obtained from wife due to encephalopathy. He was recently hospitalized at [facility] on 1/19 for increasing dyspnea and productive cough. He was diagnosed with multifocal bronchopneumonia. 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 Unable to obtain liver bx by IR due to elevated INR. He was discharged on 1/26 with follow up at [facility] for the pancreatic mass and Ceftin to finish his antibiotic course. Towards the end of his hospitalization, his wife noted that he was still have altered mentation. He continued to be encephalopathic at home. He was lethargic with poor PO intake. He had dark urine. He had dyspnea and cough. He had two formed BM yesterday with some bleeding when wiped. No fevers/chills, chest pain, dysuria, hematuria.  PAST MEDICAL HISTORY :  He  has a past medical history of Atrial fibrillation ([place]); COPD (chronic obstructive pulmonary disease) ([place]); Diabetes mellitus; Neuropathy ([place]); Pacemaker; Pancreatic mass (03/10/2016); PTSD (post-traumatic stress disorder); and Spinal stenosis.  PAST SURGICAL HISTORY: He  has a past surgical history that includes Cholecystectomy.  No Known Allergies  No current facility-administered medications on file prior to encounter.    Current Outpatient Prescriptions on File Prior to Encounter  Medication Sig  . cefUROXime (CEFTIN) 500 MG tablet Take 1 tablet (500 mg total) by mouth 2 (two) times daily with a meal. (Patient taking differently: Take 500 mg by mouth 2  (two) times daily with a meal. 5 day course started 03/16/16)  . finasteride (PROSCAR) 5 MG tablet Take 5 mg by mouth at bedtime.   Marland Kitchen glipiZIDE (GLUCOTROL) 10 MG tablet Take 20 mg by mouth daily as needed (if CBG >150).   . insulin glargine (LANTUS) 100 UNIT/ML injection Inject 15 Units into the skin daily as needed (CBG 200-225 15 units, >225 20 units). If BG > 200, inject 15 units If BG > 225, inject 20 units   . lactulose (CHRONULAC) 10 GM/15ML solution Take 10 g by mouth daily as needed for mild constipation.  Marland Kitchen levothyroxine (SYNTHROID, LEVOTHROID) 100 MCG tablet Take 200 mcg by mouth daily before breakfast.   . nortriptyline (PAMELOR) 10 MG capsule Take 10 mg by mouth at bedtime.  Marland Kitchen omeprazole (PRILOSEC) 20 MG capsule Take 20 mg by mouth at bedtime.   Marland Kitchen oxyCODONE (OXY IR/ROXICODONE) 5 MG immediate release tablet Take 5 mg by mouth 3 (three) times daily as needed for moderate pain.   . traZODone (DESYREL) 50 MG tablet Take 50 mg by mouth at bedtime as needed for sleep.   . fluticasone (FLONASE) 50 MCG/ACT nasal spray Place 1 spray into both nostrils daily as needed for allergies or rhinitis.  . mometasone (ASMANEX) 220 MCG/INH inhaler Inhale 2 puffs into the lungs 2 (two) times daily. May use at home medication  . pregabalin (LYRICA) 100 MG capsule Take 200 mg by mouth 2 (two) times daily.     FAMILY HISTORY:  His indicated that the status of his mother is unknown. He indicated that the status of his brother is unknown.    SOCIAL HISTORY: He  reports that he has never smoked. He has never used smokeless tobacco. He reports that he does not drink alcohol or use drugs.  REVIEW OF SYSTEMS:   10 point review of system performed and negative except as per HPI  SUBJECTIVE:  Dyspneic but no chest pain  VITAL SIGNS: BP (!) 82/23   Pulse (!) 121   Temp 97.9 F (36.6 C) (Oral)   Resp (!) 8   Ht 6' (1.829 m)   Wt 99.1 kg (218 lb 7.6 oz)   SpO2 (!) 83%   BMI 29.63 kg/m   VENTILATOR  SETTINGS: Vent Mode: PRVC FiO2 (%):  [40 %] 40 % Set Rate:  [16 bmp] 16 bmp Vt Set:  [620 mL] 620 mL PEEP:  [5 cmH20] 5 cmH20 Plateau Pressure:  [17 cmH20-19 cmH20] 19 cmH20  INTAKE / OUTPUT: I/O last 3 completed shifts: In: 3846 [I.V.:1175; IV Piggyback:100] Out: 270 [Urine:270]  PHYSICAL EXAMINATION: General:  Mild respiratory distress Neuro:  No focal deficits HEENT:  EOMI, icteric sclera, dry mucous membranes Cardiovascular:  RRR Lungs:  Few end expiratory wheezes Abdomen:  Soft, nontender, mild distention Musculoskeletal: 1 to 2+ pitting edema to knees Skin:  Jaundiced  LABS:  BMET  Recent Labs Lab 03/18/16 2049 03/19/16 0358 03/19/16 2241  NA 139 142 141  K 5.6* 5.1 6.8*  CL 104 106 105  CO2 17* 19* 11*  BUN 102* 106* 121*  CREATININE 1.89* 2.13* 3.19*  GLUCOSE 139* 106* 139*    Electrolytes  Recent Labs Lab 03/18/16 0347 03/18/16 2049 03/19/16 0358 03/19/16 2241  CALCIUM 7.9* 8.2* 8.6* 8.1*  MG 2.8*  --  3.2* 3.2*  PHOS 7.6*  --  8.2*  --     CBC  Recent Labs Lab 03/08/2016 1609 03/18/16 0347 03/19/16 0358  WBC 16.9* 15.2* 23.8*  HGB 8.5* 7.6* 9.0*  HCT 27.9* 25.5* 30.9*  PLT 95* 66* 85*    Coag's  Recent Labs Lab 03/18/2016 1609 03/18/16 0347 03/19/16 0358  INR 2.56 2.51 2.51    Sepsis Markers  Recent Labs Lab 02/22/2016 1628 02/20/2016 2133 03/18/16 0016  LATICACIDVEN 11.31* 5.7* 4.5*  PROCALCITON  --  4.35  --     ABG  Recent Labs Lab 03/13/2016 1740 03/19/16 1606  PHART 7.329* 7.249*  PCO2ART 24.7* 28.3*  PO2ART 96.0 310.0*    Liver Enzymes  Recent Labs Lab 03/19/2016 1609 03/18/16 0347 03/19/16 0358  AST 406* 516* 538*  ALT 262* 262* 287*  ALKPHOS 401* 388* 478*  BILITOT 30.3* 32.3* 42.2*  ALBUMIN 2.5* 2.2* 2.3*    Cardiac Enzymes  Recent Labs Lab 03/18/16 0347 03/18/16 0856  TROPONINI 0.06* 0.08*    Glucose  Recent Labs Lab 03/19/16 0802 03/19/16 1153 03/19/16 1612 03/19/16 2014  04/14/2016 0019 2016/04/14 0047  GLUCAP 85 115* 113* 121* <10* 49*    Imaging Dg Chest Port 1 View  Result Date: 03/19/2016 CLINICAL DATA:  Encounter for intubation and central line placement. EXAM: PORTABLE CHEST 1 VIEW COMPARISON:  03/03/2016 FINDINGS: Endotracheal tube tip projects 3.5 cm above the carina. There is a new nasal/ orogastric tube passing below the diaphragm well into the stomach and below the included field of view. New left internal jugular central venous line has its tip in the lower superior vena cava. Right anterior chest wall sequential pacemaker is stable and well positioned. Stable ill-defined nodular opacities in the lower lungs. No evidence of pneumonia or pulmonary edema. No pneumothorax. IMPRESSION: 1. Endotracheal tube is well positioned,  tip projecting 3.5 cm about the carina. 2. Left internal jugular central venous line tip projects in the lower superior vena cava. No pneumothorax. Nasal/orogastric tube is well positioned passing below the diaphragm well into the stomach. 3. No other change from the prior study. No acute findings in the lungs. Electronically Signed   By: Lajean Manes M.D.   On: 03/19/2016 15:53   Dg Abd Portable 1v  Result Date: 03/19/2016 CLINICAL DATA:  Assess orogastric tube placement. EXAM: PORTABLE ABDOMEN - 1 VIEW COMPARISON:  CT scan of the abdomen pelvis of March 09, 2016 FINDINGS: An orogastric tube has been placed. The proximal port as well as the tip project below the expected location of the GE junction. The tip lies in the mid gastric body. The bowel gas pattern is within the limits of normal. There are numerous surgical clips in the right mid abdomen. There is contrast within the stool in the descending colon and rectosigmoid. IMPRESSION: Adequate positioning of the orogastric tube. Electronically Signed   By: David  Martinique M.D.   On: 03/19/2016 15:54     STUDIES:  CXR 1/28 > No acute abnormalities. Stable patchy nodular opacities over  the mid to lower lungs as seen on recent CT. EKG 1/28 > rate controlled atrial fibrillation, similar to previous EKG  CULTURES: 1/28 BCx > 1/28 RVP > 1/28 Sputum cx > 1/28 UCx >  ANTIBIOTICS: Vanc 1/28 >  Zosyn 1/28 >  SIGNIFICANT EVENTS: 1/28 admit to ICU  LINES/TUBES: PIV  DISCUSSION: 72 year old man with history of atrial fibrillation, COPD, DM2, recent diagnosis of pancreatic mass and likely metastatic disease to liver and lungs presenting with acute encephalopathy, dyspnea, cough. Recently hospitalized at [facility] on 1/19 for multifocal bronchopneumonia. Unable to obtain liver bx by IR due to elevated INR.   ASSESSMENT / PLAN:  PULMONARY A: Metastatic disease (see below) Recent multilobar PNA with sepsis: was d/c'd with Ceftin COPD P:   Supplemental oxygen prn to maintain O2 sat >88% Scheduled duonebs Prednisone daily  CARDIOVASCULAR A:  Elevated troponin> 0.05 Atrial fibrillation> anticoagulation on hold for bx P:  Trend trops  RENAL A:   Metabolic acidosis with increased anion gap > ABG pH 7.33, pCO2 24.7, bicarb 15, gap 21 Lactic acidosis > 11.31 on admission Acute Hyponatremia > 131 on admission Hyperkalemia > K 6 on admission AKI > Cr 2.56 on admission. 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 Was referred to [facility] Intrahepatic Cholestasis > Alk phos up to 401 from 308, AST 406 from 268, ALT 262 from 159. Bilirubin 30 from 13.  Hemorrhoids P:   Consult IR when INR improved Was seen by Dr. Oneida Alar (GI) while at [place] A:   Anemia of chronic disease > Hgb 8.5, previously 9.5 Leukocytosis 16.9 Coagulopathy > PT 28 and INR 2.56 P:  69m vitamin K daily x 3  Follow PT/INR  INFECTIOUS A:   Possible HCAP, COPD  exacerbation P:   1/28 BCx > 1/28 RVP > 1/28 Sputum cx > 1/28 UCx > Vanc 1/28 >  Zosyn 1/28 > Follow PCT  ENDOCRINE A:   DM2   P:   SSI  NEUROLOGIC A:   Acute encephalopathy > Ammonia 45 ?Dementia P:   RASS goal: 0   FAMILY  -  Updates: Wife and son at bedside 1/28.   - Inter-disciplinary family meet or Palliative Care meeting due by:  2/4   Jacques Earthly, MD  Internal Medicine PGY-3 Pager: [phone] If no answer or after 3PM: [phone]  Apr 14, 2016, 3:31 PM  Addendum : pt had a complicated ICU course.  Pls see my last progress note :  PULMONARY / CRITICAL CARE MEDICINE   Name: Tony [name] MRN: [id number] DOB: 28-Jul-1945    ADMISSION DATE:  03/09/2016 CONSULTATION DATE:  1/28  REFERRING MD:  Rex Kras MD (EDP)  CHIEF COMPLAINT:  Acute encephalopathy  SUBJECTIVE:  Transferred to SDU overnight.  Has continued tachypnea and mild increase WOB (but no distress).  VITAL SIGNS: BP (!) 82/23   Pulse (!) 121   Temp 97.9 F (36.6 C) (Oral)   Resp (!) 8   Ht 6' (1.829 m)   Wt 99.1 kg (218 lb 7.6 oz)   SpO2 (!) 83%   BMI 29.63 kg/m   VENTILATOR SETTINGS: Vent Mode: PRVC FiO2 (%):  [40 %] 40 % Set Rate:  [16 bmp] 16 bmp Vt Set:  [620 mL] 620 mL PEEP:  [5 cmH20] 5 cmH20 Plateau Pressure:  [17 cmH20-19 cmH20] 19 cmH20  INTAKE / OUTPUT: I/O last 3 completed shifts: In: 6789 [I.V.:1175; IV Piggyback:100] Out: 270 [Urine:270]  PHYSICAL EXAMINATION: General:  Mild respiratory distress Neuro:  No focal deficits HEENT:  EOMI, icteric sclera, dry mucous membranes Cardiovascular:  RRR Lungs:  Clear bilaterally, no wheezes, tachypneic Abdomen:  Soft, nontender, mild distention Musculoskeletal: 1 + non pitting edema to knees Skin:  Jaundiced  LABS:  BMET  Recent Labs Lab 03/18/16 2049 03/19/16 0358 03/19/16 2241  NA 139 142 141  K 5.6* 5.1 6.8*  CL 104 106 105  CO2 17* 19* 11*  BUN 102* 106* 121*  CREATININE 1.89* 2.13* 3.19*  GLUCOSE  139* 106* 139*    Electrolytes  Recent Labs Lab 03/18/16 0347 03/18/16 2049 03/19/16 0358 03/19/16 2241  CALCIUM 7.9* 8.2* 8.6* 8.1*  MG 2.8*  --  3.2* 3.2*  PHOS 7.6*  --  8.2*  --     CBC  Recent Labs Lab 03/13/2016 1609 03/18/16 0347 03/19/16 0358  WBC 16.9* 15.2* 23.8*  HGB 8.5* 7.6* 9.0*  HCT 27.9* 25.5* 30.9*  PLT 95* 66* 85*    Coag's  Recent Labs Lab 03/08/2016 1609 03/18/16 0347 03/19/16 0358  INR 2.56 2.51 2.51    Sepsis Markers  Recent Labs Lab 03/18/2016 1628 02/19/2016 2133 03/18/16 0016  LATICACIDVEN 11.31* 5.7* 4.5*  PROCALCITON  --  4.35  --     ABG  Recent Labs Lab 02/26/2016 1740 03/19/16 1606  PHART 7.329* 7.249*  PCO2ART 24.7* 28.3*  PO2ART 96.0 310.0*    Liver Enzymes  Recent Labs Lab 03/10/2016 1609 03/18/16 0347 03/19/16 0358  AST 406* 516* 538*  ALT 262* 262* 287*  ALKPHOS 401* 388* 478*  BILITOT 30.3* 32.3* 42.2*  ALBUMIN 2.5* 2.2* 2.3*    Cardiac Enzymes  Recent Labs Lab 03/18/16 0347 03/18/16 0856  TROPONINI 0.06* 0.08*    Glucose  Recent Labs Lab 03/19/16 0802 03/19/16 1153 03/19/16 1612 03/19/16 2014 04-14-16 0019 2016-04-14 0047  GLUCAP 85 115* 113* 121* <10* 49*    Imaging Dg Chest Port 1 View  Result Date: 03/19/2016 CLINICAL DATA:  Encounter for intubation and central line placement. EXAM: PORTABLE CHEST 1 VIEW COMPARISON:  03/11/2016 FINDINGS: Endotracheal tube tip projects 3.5 cm above the carina. There is a  new nasal/ orogastric tube passing below the diaphragm well into the stomach and below the included field of view. New left internal jugular central venous line has its tip in the lower superior vena cava. Right anterior chest wall sequential pacemaker is stable and well positioned. Stable ill-defined nodular opacities in the lower lungs. No evidence of pneumonia or pulmonary edema. No pneumothorax. IMPRESSION: 1. Endotracheal tube is well positioned, tip projecting 3.5 cm about the carina. 2.  Left internal jugular central venous line tip projects in the lower superior vena cava. No pneumothorax. Nasal/orogastric tube is well positioned passing below the diaphragm well into the stomach. 3. No other change from the prior study. No acute findings in the lungs. Electronically Signed   By: Lajean Manes M.D.   On: 03/19/2016 15:53   Dg Abd Portable 1v  Result Date: 03/19/2016 CLINICAL DATA:  Assess orogastric tube placement. EXAM: PORTABLE ABDOMEN - 1 VIEW COMPARISON:  CT scan of the abdomen pelvis of March 09, 2016 FINDINGS: An orogastric tube has been placed. The proximal port as well as the tip project below the expected location of the GE junction. The tip lies in the mid gastric body. The bowel gas pattern is within the limits of normal. There are numerous surgical clips in the right mid abdomen. There is contrast within the stool in the descending colon and rectosigmoid. IMPRESSION: Adequate positioning of the orogastric tube. Electronically Signed   By: David  Martinique M.D.   On: 03/19/2016 15:54     STUDIES:  CXR 1/28 > No acute abnormalities. Stable patchy nodular opacities over the mid to lower lungs as seen on recent CT. EKG 1/28 > rate controlled atrial fibrillation, similar to previous EKG  CULTURES: 1/28 BCx > 1/28 RVP > metapneumovirus 1/28 Sputum cx > 1/28 UCx >  ANTIBIOTICS: Vanc 1/28 >  Zosyn 1/28 >  SIGNIFICANT EVENTS: 1/28 admit to ICU  LINES/TUBES: PIV  DISCUSSION: 71 year old man with history of atrial fibrillation, COPD, DM2, recent diagnosis of pancreatic mass and likely metastatic disease to liver and lungs presenting with acute encephalopathy, dyspnea, cough. Recently hospitalized at [facility] on 1/19 for multifocal bronchopneumonia and were unable to obtain liver bx by IR due to elevated INR.  IR planning for liver bx now at [facility] (held off 1/29 for family discussions) > wife wants aggressive care / full code.  ASSESSMENT /  PLAN:  PULMONARY A: Metastatic disease (see below) Recent multilobar PNA with sepsis: was d/c'd with Ceftin COPD Metapneumovirus URI P:   Supplemental oxygen prn to maintain O2 sat >88% Scheduled duonebs Solumedrol daily At risk for intubation - would STRONGLY recommend against this as would not change outcome  CARDIOVASCULAR A:  Atrial fibrillation> anticoagulation on hold for bx P:  Continue to hold anticoagulation  RENAL A:   Metabolic acidosis with increased anion gap - uremia, lactate Acute Hyponatremia > resolved Hyperkalemia > resolved AKI > Cr 2.56 on admission. Baseline around 1. 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> liver bx not done due to elevated INR (was to f/u at [facility]), IR now planning for bx here Transaminitis > worsening Hemorrhoids P:   Have asked family to come to hospital today for continued goals of care discussions - depending on these discussions, will revisit liver bx (will recommend holding off to family along with DNR and hospice)  HEMATOLOGIC A:  Anemia of chronic disease Coagulopathy > PT 28 and INR 2.51 P:  26m vitamin K daily x 3  Follow PT/INR  INFECTIOUS A:   Possible HCAP, COPD exacerbation P:   Follow cultures as above Continue empiric abx (vanc / zosyn) Follow PCT - have been unable to draw due to hyperbilirubinemia  ENDOCRINE A:   DM2  Hypothyroidism  P:   SSI Continue home levothyroxine, IV formulation Start Levemir 10u BID Check TSH and HgbA1c  NEUROLOGIC A:   Acute encephalopathy > Ammonia 45 01/28 ?Dementia P:   RASS goal: 0 Consider more lactulose Continue home trazodone Consider Haldol - will need to follow QTc Hold home nortriptyline, pregabalin   FAMILY  - Updates: Wife and son updated over the phone 01/30 > have asked them to come in to hospital for continued goals of  care discussions.  - Inter-disciplinary family meet or Palliative Care meeting due by:  2/4    RMontey Hora PPowellPulmonary & Critical Care Medicine Pager: [phone] or [phone] 3:32 PM     ATTENDING NOTE / ATTESTATION NOTE :   I have discussed the case with the resident/APP  RMontey HoraPA.   I agree with the resident/APP's  history, physical examination, assessment, and plans.    I have edited the above note and modified it according to our agreed history, physical examination, assessment and plan.   Briefly, pt with several medical issues (afib, DM, copd), admitted recently for PNA, came back for AMS 1 day  after discharge, generalized weakness.Work up revealed pancreatic mass with mets to liver and lung. Treated as possible sepsis, got IVF.  Not on pressors. With demand ischemia and mild AECOPD. Also with lactic acidosis, AKI, hyperK. [facility]  Overnight mental status was stable.  Last 6 hrs, noted to be more somnolent, drowsy, unable to protect airway.  I extensively d/w family re: over all prognosis.  They wanted to keep pt full code as they are trying to get hold of pt's estranged family.  Pt was transferred to 4N ICU.  He was intubated.  He just needed etomidate as he was "stiff" some. Remains confused, agitated at times.  BP 80 systolic post intubation.   Vitals:  Vitals:   002/22/20180110 002/22/20180112 [id number] 002/22/[id number]  BP: (!) 59/48 (!) 49/38 (!) 82/23   Pulse:      Resp: 17 (!) 24 (!) 50 (!) 8  Temp:      TempSrc:      SpO2:      Weight:      Height:        Constitutional/General: well-nourished, well-developed, intubated, sedated, icteric/jaundiced.    Body mass index is 29.63 kg/m. Wt Readings from Last 3 Encounters:  03/19/16 99.1 kg (218 lb 7.6 oz)  03/08/16 94.9 kg (209 lb 4.8 oz)  11/15/15 97.3 kg (214 lb 9.9 oz)    HEENT: PERLA, icteric sclerae. (-) Oral thrush.  Intubated, ETT in place  Neck: No masses. Midline trachea. No JVD, (-) LAD. (-) bruits appreciated.  Respiratory/Chest: Grossly normal chest. (-) deformity. (-) Accessory muscle use.  Symmetric expansion. Diminished BS on both lower lung zones. (-) wheezing, crackles, rhonchi (-) egophony  Cardiovascular: Regular rate and  rhythm, heart sounds normal, no murmur or gallops,  Trace peripheral edema  Gastrointestinal:  Dec bowel sounds. Soft, non-tender. Liver feels enlarged. (+) fluid wave  (-)  masses.   Musculoskeletal:  Normal muscle tone.   Extremities: Grossly normal. (-) clubbing, cyanosis.  Trace edema.   Skin: (-) rash,lesions seen.   Neurological/Psychiatric : sedated, intubated. CN grossly intact. (-) lateralizing signs.   CBC Recent Labs     02/26/2016  1609  03/18/16  0347  03/19/16  0358  WBC  16.9*  15.2*  23.8*  HGB  8.5*  7.6*  9.0*  HCT  27.9*  25.5*  30.9*  PLT  95*  66*  85*    Coag's Recent Labs     03/01/2016  1609  03/18/16  0347  03/19/16  0358  INR  2.56  2.51  2.51    BMET Recent Labs     03/18/16  2049  03/19/16  0358  03/19/16  2241  NA  139  142  141  K  5.6*  5.1  6.8*  CL  104  106  105  CO2  17*  19*  11*  BUN  102*  106*  121*  CREATININE  1.89*  2.13*  3.19*  GLUCOSE  139*  106*  139*    Electrolytes Recent Labs     03/18/16  0347  03/18/16  2049  03/19/16  0358  03/19/16  2241  CALCIUM  7.9*  8.2*  8.6*  8.1*  MG  2.8*   --   3.2*  3.2*  PHOS  7.6*   --   8.2*   --     Sepsis Markers Recent Labs     02/23/2016  2133  PROCALCITON  4.35    ABG Recent Labs     03/08/2016  1740  03/19/16  1606  PHART  7.329*  7.249*  PCO2ART  24.7*  28.3*  PO2ART  96.0  310.0*    Liver Enzymes Recent Labs     03/16/2016  1609  03/18/16  0347  03/19/16  0358  AST  406*  516*  538*  ALT  262*  262*  287*  ALKPHOS  401*  388*  478*  BILITOT  30.3*  32.3*  42.2*  ALBUMIN  2.5*  2.2*  2.3*    Cardiac Enzymes Recent Labs      03/18/16  0347  03/18/16  0856  TROPONINI  0.06*  0.08*    Glucose Recent Labs     03/19/16  0802  03/19/16  1153  03/19/16  1612  03/19/16  2014  10-Apr-2016  0019  04-10-2016  0047  GLUCAP  85  115*  113*  121*  <10*  49*    Imaging Dg Chest Port 1 View  Result Date: 03/19/2016 CLINICAL DATA:  Encounter for intubation and central line placement. EXAM: PORTABLE CHEST 1 VIEW COMPARISON:  02/25/2016 FINDINGS: Endotracheal tube tip projects 3.5 cm above the carina. There is a new nasal/ orogastric tube passing below the diaphragm well into the stomach and below the included field of view. New left internal jugular central venous line has its tip in the lower superior vena cava. Right anterior chest wall sequential pacemaker is stable and well positioned. Stable ill-defined nodular opacities in the lower lungs. No evidence of pneumonia or pulmonary edema. No pneumothorax. IMPRESSION: 1. Endotracheal tube is well positioned, tip projecting 3.5 cm about the carina. 2. Left internal jugular central venous line tip projects in the lower superior vena cava. No pneumothorax. Nasal/orogastric tube is well positioned passing below the diaphragm well into the stomach. 3. No other change from the  prior study. No acute findings in the lungs. Electronically Signed   By: Lajean Manes M.D.   On: 03/19/2016 15:53   Dg Abd Portable 1v  Result Date: 03/19/2016 CLINICAL DATA:  Assess orogastric tube placement. EXAM: PORTABLE ABDOMEN - 1 VIEW COMPARISON:  CT scan of the abdomen pelvis of March 09, 2016 FINDINGS: An orogastric tube has been placed. The proximal port as well as the tip project below the expected location of the GE junction. The tip lies in the mid gastric body. The bowel gas pattern is within the limits of normal. There are numerous surgical clips in the right mid abdomen. There is contrast within the stool in the descending colon and rectosigmoid. IMPRESSION: Adequate positioning of the  orogastric tube. Electronically Signed   By: David  Martinique M.D.   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 Liver Biopsy has held 2/2 increased risk of bleeding and pts INR is elevated and plt cnt is low. Palliative care has met with family.  I extensively discussed prognosis with wife and sons.  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  They also want to get in touch with pt's estranged family before they limit his code status.   Hepatic encephalopathy/ Liver failure/ Obstructive Jaundice 2/2 above.  Re start lactulose. Keep 3-5 BM.   Lactic acidosis, better. Likely 2/2 above.  Cont to observe.   AKI 2/2 above + Poor PO intake. Cont IVF at 78 mls/hr. Will need IVF bolus if BP becomes low with sedation.   Hyperkalemia. Improved.  Will observe.   Atrial fibrillation.  He was on [place] as oupt ( I was told) but that is on hold 2/2 elevated INR and low plt.   DM. Cont insulin SS and lantus.   Concern for sepsis. He was volume resuscitated.  There is no evidence for infection.  Will keep on Rocephin for SBP prophylaxis.  Will d/c vanc and zosyn. Lactic acid elevated 2/2 liver dse.     I spent  45  minutes of Critical Care time with this patient today. This is my time spent independent of the APP or resident.   Family :Family updated at length today extensively. Pt remains a full code.    Monica Becton, MD 03/27/16, 3:32 PM [facility] Pager [phone] After 3 pm or if no answer, call [phone]   Addendum :   Pt became hypotensive eventually while in ICU. He was acidemic with elevated K.  Hyperkalemia was treated. renal failure worsened as well as hypotension despite pressors.  He went into PEA arrest several times.  Code blue was called several times.  He was in and out of PEA. Team was constantly trying to get hold of family.  After 21  minutes of CPR, he was pronounced.   As of the last documentation, team tried to get hold of family after he was pronounced. TOD Mar 27, 2016 0116.   Procedures during admission : Intubation 1/30 CVL insertion 1/30 CPR 03-27-22  J. Shirl Harris, MD Mar 27, 2016, 3:35 PM [facility] Pager [phone] After 3 pm or if no answer, call [phone]

## 2016-03-21 NOTE — Progress Notes (Signed)
[facility] Progress Note Patient Name: Tony [name] DOB: 1945/06/02 MRN: [id number]   Date of Service  04/10/2016  HPI/Events of Note  Cardiac Arrest - Patient became bradycardic and then lost pulse in setting of metastatic cancer, decompensated liver failure and renal failure. K+ = 6.8 and last pH = 7.249.  eICU Interventions  Will order: 1. D50 1 amp IV now. 2. Novolog insulin 10 units IV now.  3. Ca++ 1 amp IV now. 4. Sodium Bicarb 100 meq IV now. 5. Ground team made aware of events. Further management per ground team.      Intervention Category Major Interventions: Code management / supervision  Sommer,Steven Eugene 2016/04/10, 12:00 AM

## 2016-03-21 NOTE — Significant Event (Signed)
PCCM INTERVAL PROGRESS NOTE  Called to bedside to respond to cardiac arrest on several occasions tonight. Mr. Douthat initially suffered bradycardic to PEA arrest and was found to have hyperkalemia which was treated with HCO3, insulin, calcium, and Kayexalate. Despite these therapies including dopamine and HCO3 infusions he continues to suffer from recurrent cardiac arrests. I am again called to respond to cardiac arrest. PEA. There was one pulse check with VT without pulse and he was defibrillated. He was unable to regain pulses after total cardiac arrest time tonight of at least 20 minutes. 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 Ashok Cordia and Dr. Corinna Lines the decision was made to cease resuscitation efforts at the time of death 0116.   Georgann Housekeeper, AGACNP-BC [facility] Pulmonology/Critical Care Pager [phone] or [phone]  04-18-2016 1:17 AM

## 2016-03-21 NOTE — Code Documentation (Signed)
CODE BLUE NOTE  Patient Name: Tony [name]   MRN: [id number]   Date of Birth/ Sex: 03-Nov-1945 , male      Admission Date: 03/19/2016  Attending Provider: Rush Landmark, MD  Primary Diagnosis: <principal problem not specified>    FM and IM residents responded to repeat code. Critical care is still at bedside running code at this time.        Bufford Lope, DO  11-Apr-2016, 1:05 AM

## 2016-03-21 NOTE — Progress Notes (Signed)
Called all contacts on pts contact list, was able to leave a message on pts sons VM, no family seen since notification of pts death. Will wait till 5am and transfer body to morgue

## 2016-03-21 NOTE — Progress Notes (Signed)
HR 41, Map 57, Dr. Emmit Alexanders notified, orders being given, 2352; pt noted in PEA, no pulse felt. CPR initiated, Still on phone with MD, and he is aware, code called, Dr. Heber [place], Eddie Dibbles on his way.

## 2016-03-21 NOTE — Progress Notes (Addendum)
April 01, 2016.  0010: Pt coded multiple times, please see code sheet. Dr. Heber [place] notified pts son, states son will call back after speaking with pts wife

## 2016-03-21 NOTE — Progress Notes (Signed)
Pt in Asystole at 0055, CPR initiated, Dr. Heber [place] at bedside ACLS algorithm followed. Please see code sheet. Time of death called by Dr Heber [place] at 0116, and he notified Son, family on their way to see pt

## 2016-03-21 NOTE — Code Documentation (Signed)
CODE BLUE NOTE  Patient Name: Tony [name]   MRN: [id number]   Date of Birth/ Sex: 03/04/1945 , male      Admission Date: 03/06/2016  Attending Provider: Rush Landmark, MD  Primary Diagnosis: <principal problem not specified>    FM and IM residents responded to code. Critical care is running code at this time.        Bufford Lope, DO  April 04, 2016, 12:02 AM

## 2016-03-21 NOTE — Progress Notes (Signed)
Called pts family again, no success and  no family showed up. Pt sent to morgue

## 2016-03-21 NOTE — Progress Notes (Signed)
Multiple attempts made to reach family on phone numbers in chart, message left, will continue to try

## 2016-03-21 DEATH — deceased

## 2016-03-22 ENCOUNTER — Telehealth: Payer: Self-pay

## 2016-03-22 LAB — CULTURE, BLOOD (ROUTINE X 2)
CULTURE: NO GROWTH
Culture: NO GROWTH

## 2016-03-22 NOTE — Telephone Encounter (Signed)
On 03/22/16 I received a death certificate from [facility] (original). The death certificate is for burial. The patient is a patient of Doctor Dios. The death certificate will be taken to [facility] (2100) this pm for signature.   On 04-08-2016 I received the death certificate back from [place]. I got the death certificate ready and called the funeral home to let them know the death certificate is ready for pickup.

## 2017-10-04 IMAGING — DX DG CHEST 2V
2 series · 2 of 2 positions shown · non-contrast
Comparison: Chest x-ray 06/02/2015.

CLINICAL DATA: 70-year-old male with dyspnea on exertion. Cough and
congestion for the past 2-3 days.

EXAM:
CHEST  2 VIEW

[chest pa]
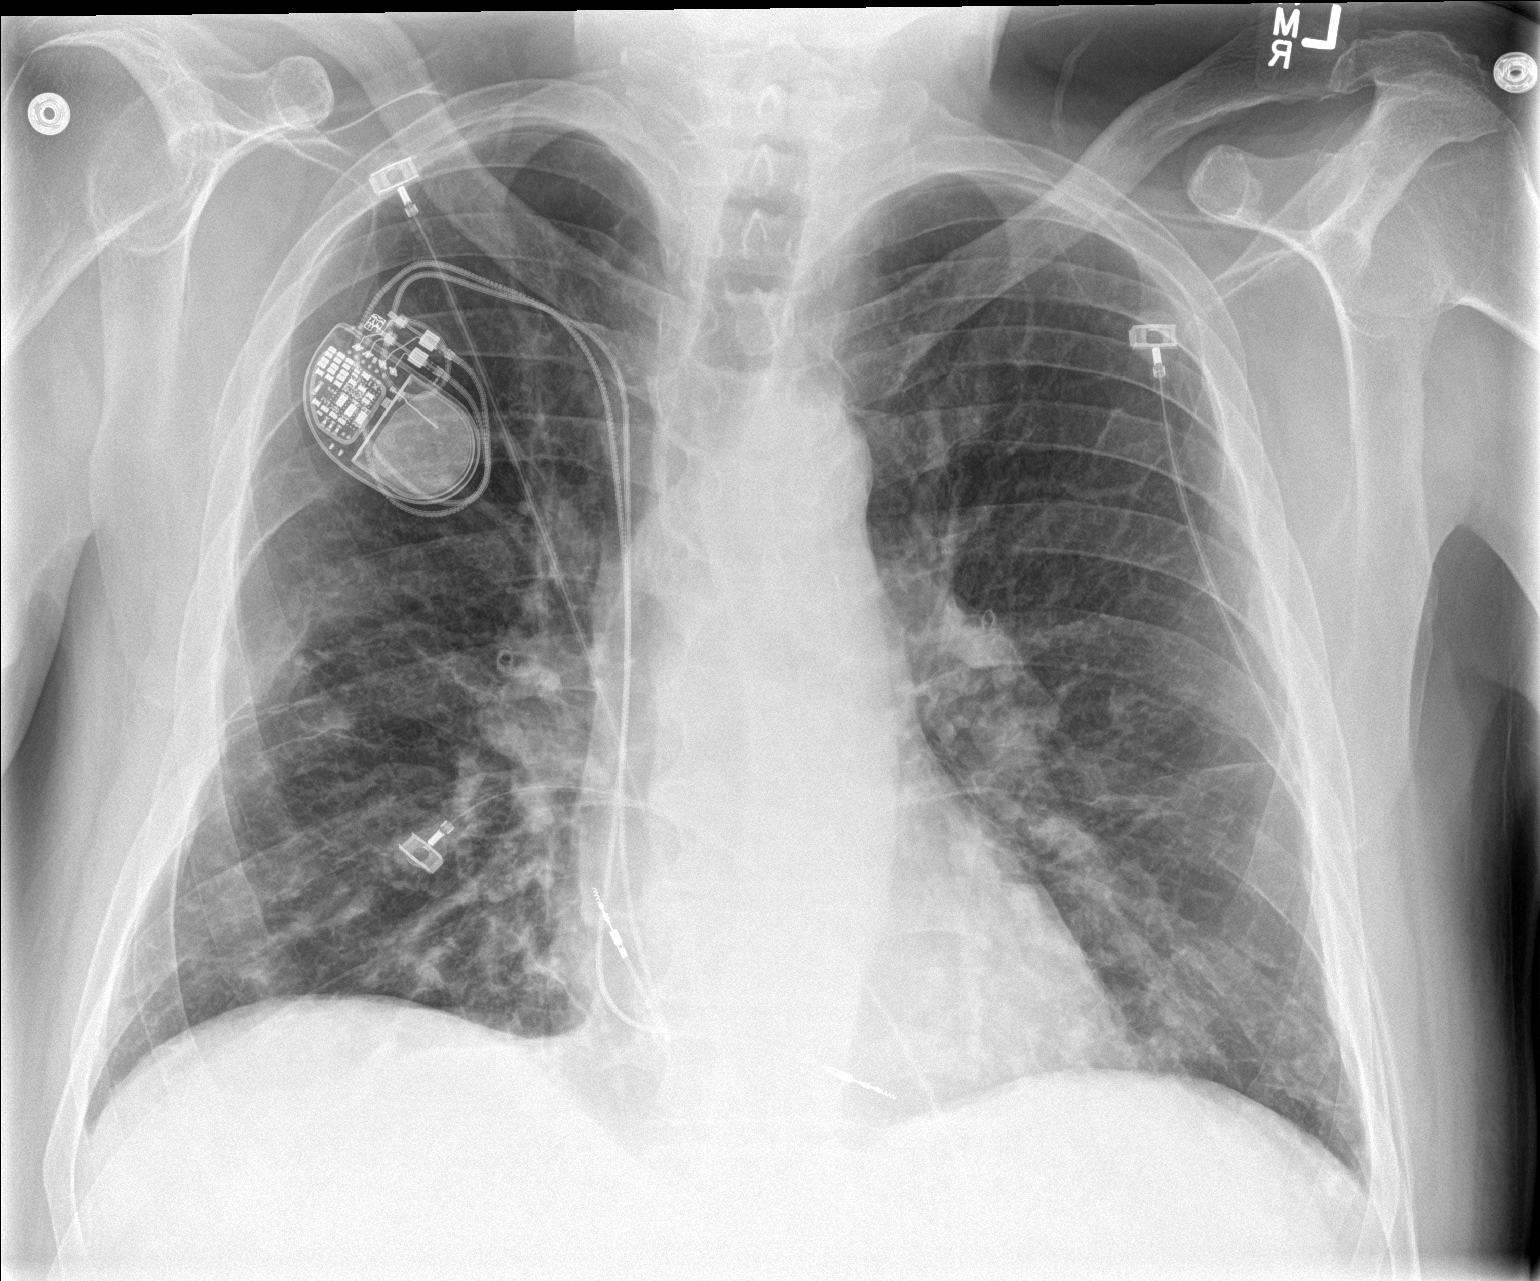

[chest lat]
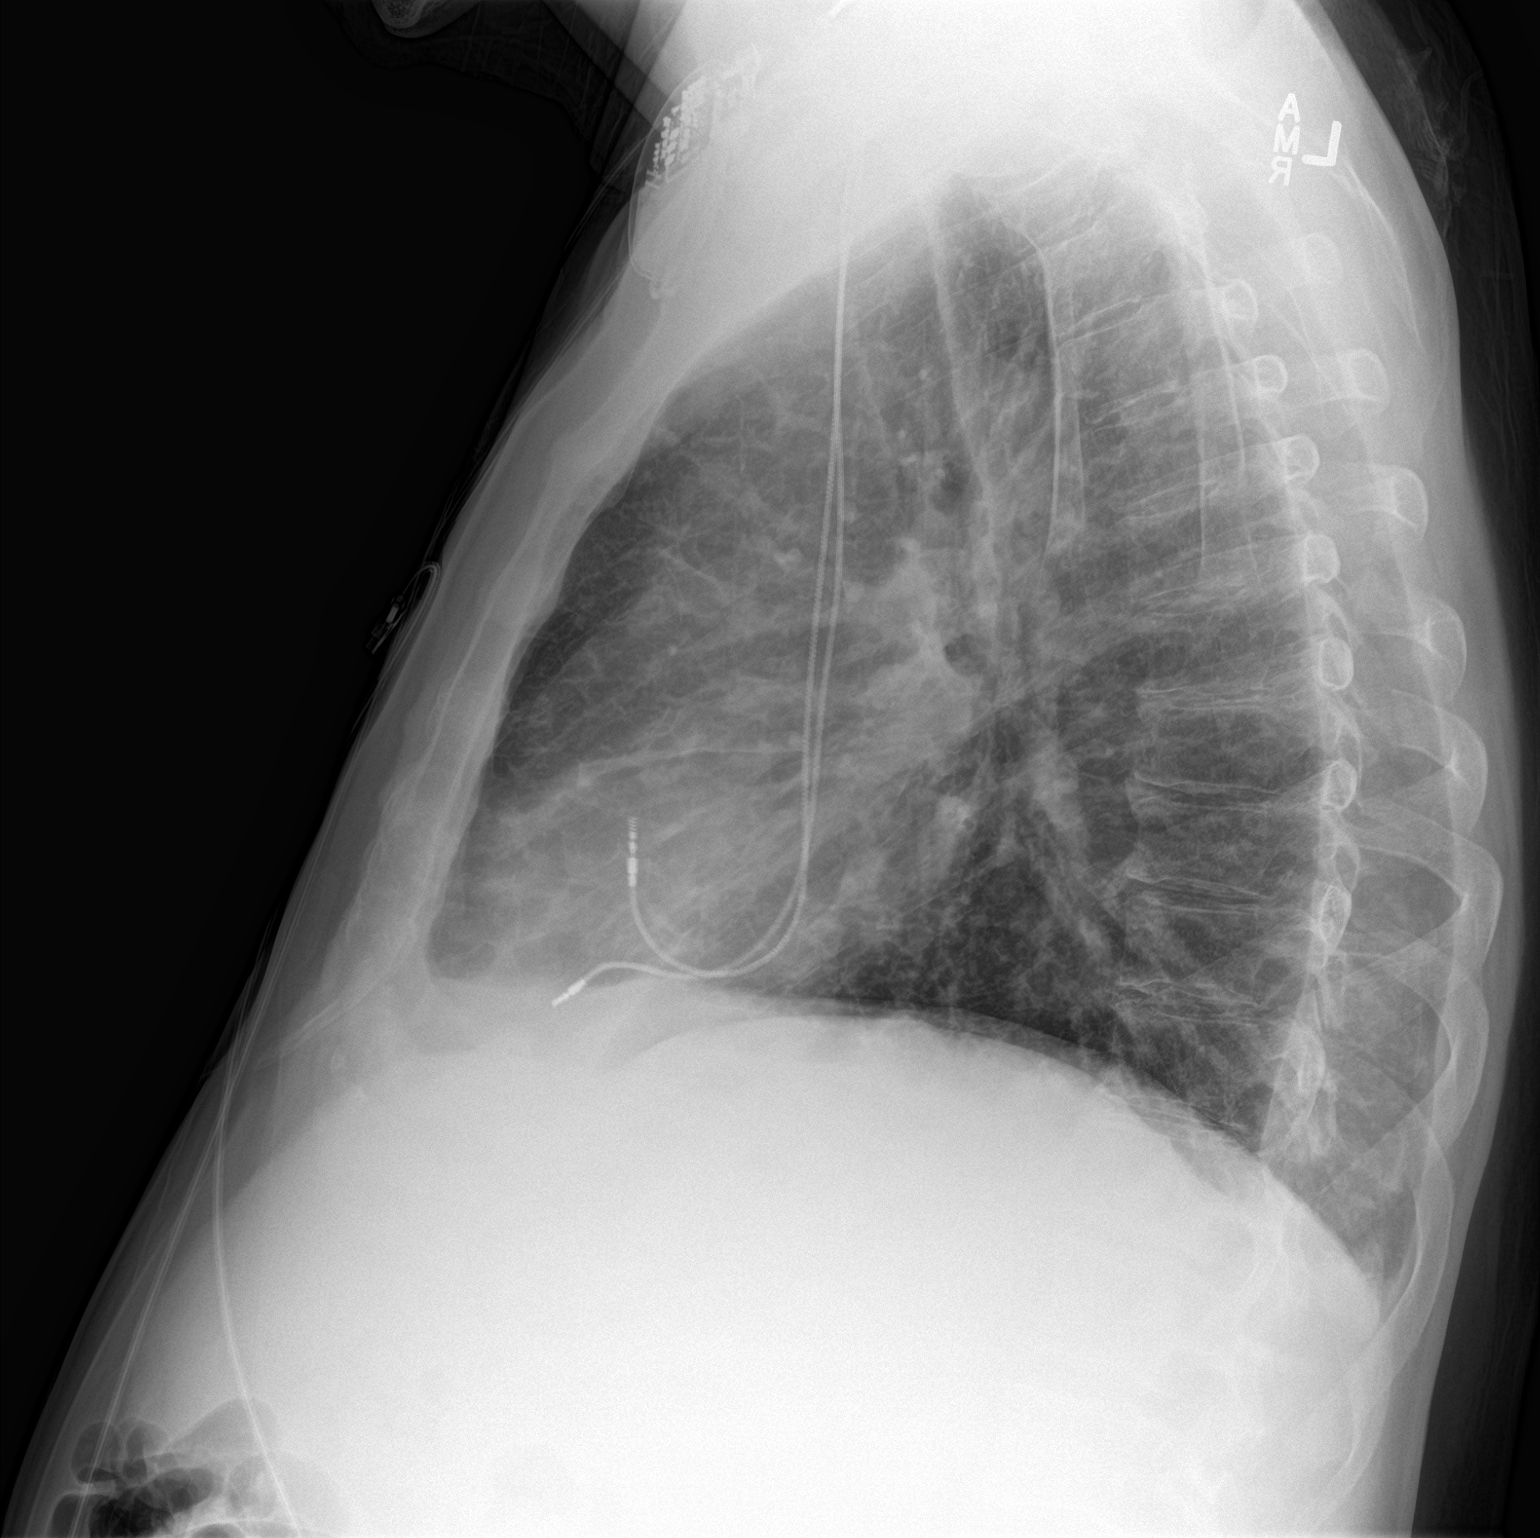

[2 of 2 positions shown; findings below may reference images not displayed]

FINDINGS: Diffuse peribronchial cuffing. Ill-defined opacities in the region
of the lingula and right middle lobe. No confluent consolidative
airspace disease. No pleural effusions. No evidence of pulmonary
edema. Heart size is normal. Atherosclerosis in the thoracic aorta.
Upper mediastinal contours are within normal limits. Right-sided
pacemaker device in place with lead tips projecting over the
expected location of the right atrium and right ventricular apex.
IMPRESSION: 1. Findings are compatible with bronchitis, potentially with
multilobar bronchopneumonia involving the right middle lobe and
lingula. Followup PA and lateral chest X-ray is recommended in 3-4
weeks following trial of antibiotic therapy to ensure resolution and
exclude underlying malignancy.
2. Aortic atherosclerosis.

## 2017-10-13 IMAGING — CR DG CHEST 1V PORT
1 series · 1 of 1 positions shown · non-contrast
Comparison: 03/08/2016 and chest CT 03/09/2016, chest x-ray
06/02/2015

CLINICAL DATA: Recent discharge for pneumonia.  Weakness.  COPD.

EXAM:
PORTABLE CHEST 1 VIEW

[AP]
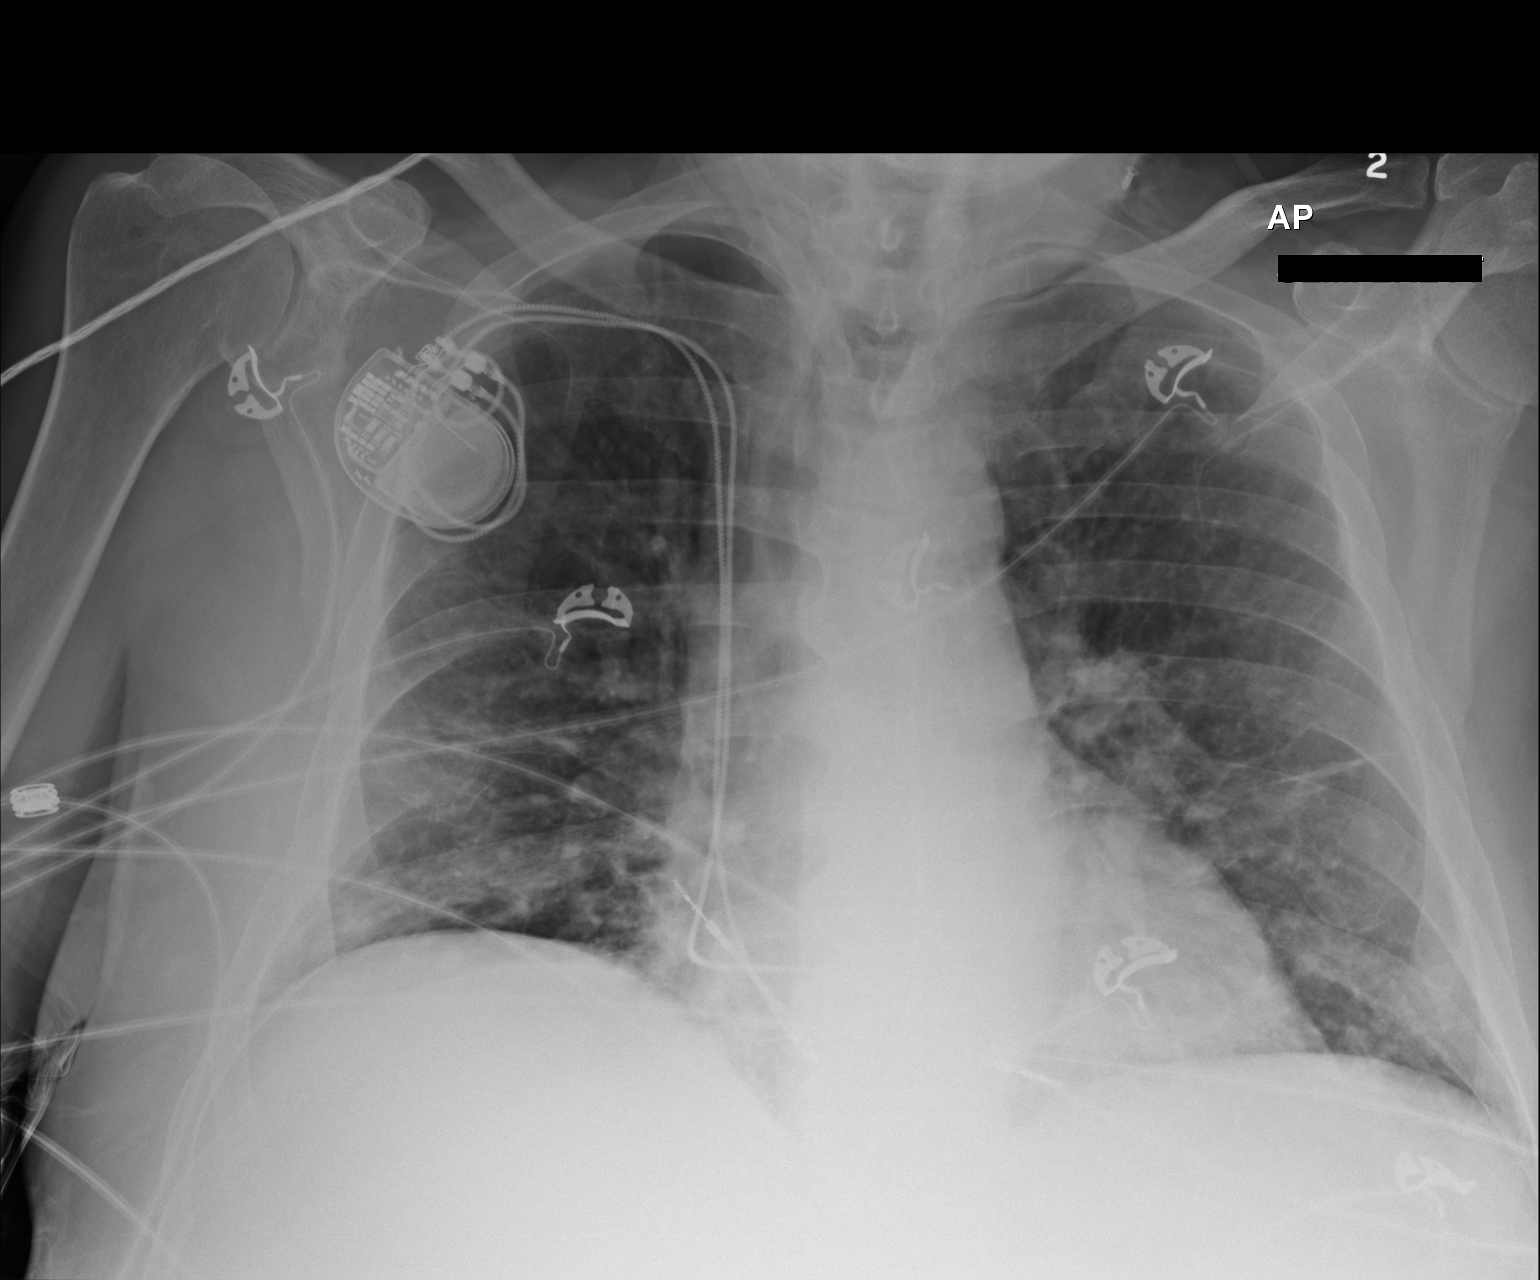

[1 of 1 positions shown; findings below may reference images not displayed]

FINDINGS: Right-sided pacemaker unchanged. Lungs are adequately inflated
demonstrate persistent patchy nodular opacities over the mid to
lower lungs unchanged compatible a recent CT findings of probable
pulmonary metastatic disease. No focal airspace consolidation or
effusion. Cardiomediastinal silhouette and remainder of the exam is
unchanged.
IMPRESSION: No acute cardiopulmonary disease.

Stable patchy nodular opacities over the mid to lower lungs as seen
on recent CT suggesting pulmonary metastatic disease.

## 2017-10-15 IMAGING — DX DG ABD PORTABLE 1V
1 series · 1 of 1 positions shown · non-contrast
Comparison: CT scan of the abdomen pelvis of March 09, 2016

CLINICAL DATA: Assess orogastric tube placement.

EXAM:
PORTABLE ABDOMEN - 1 VIEW

[abdomen kub]
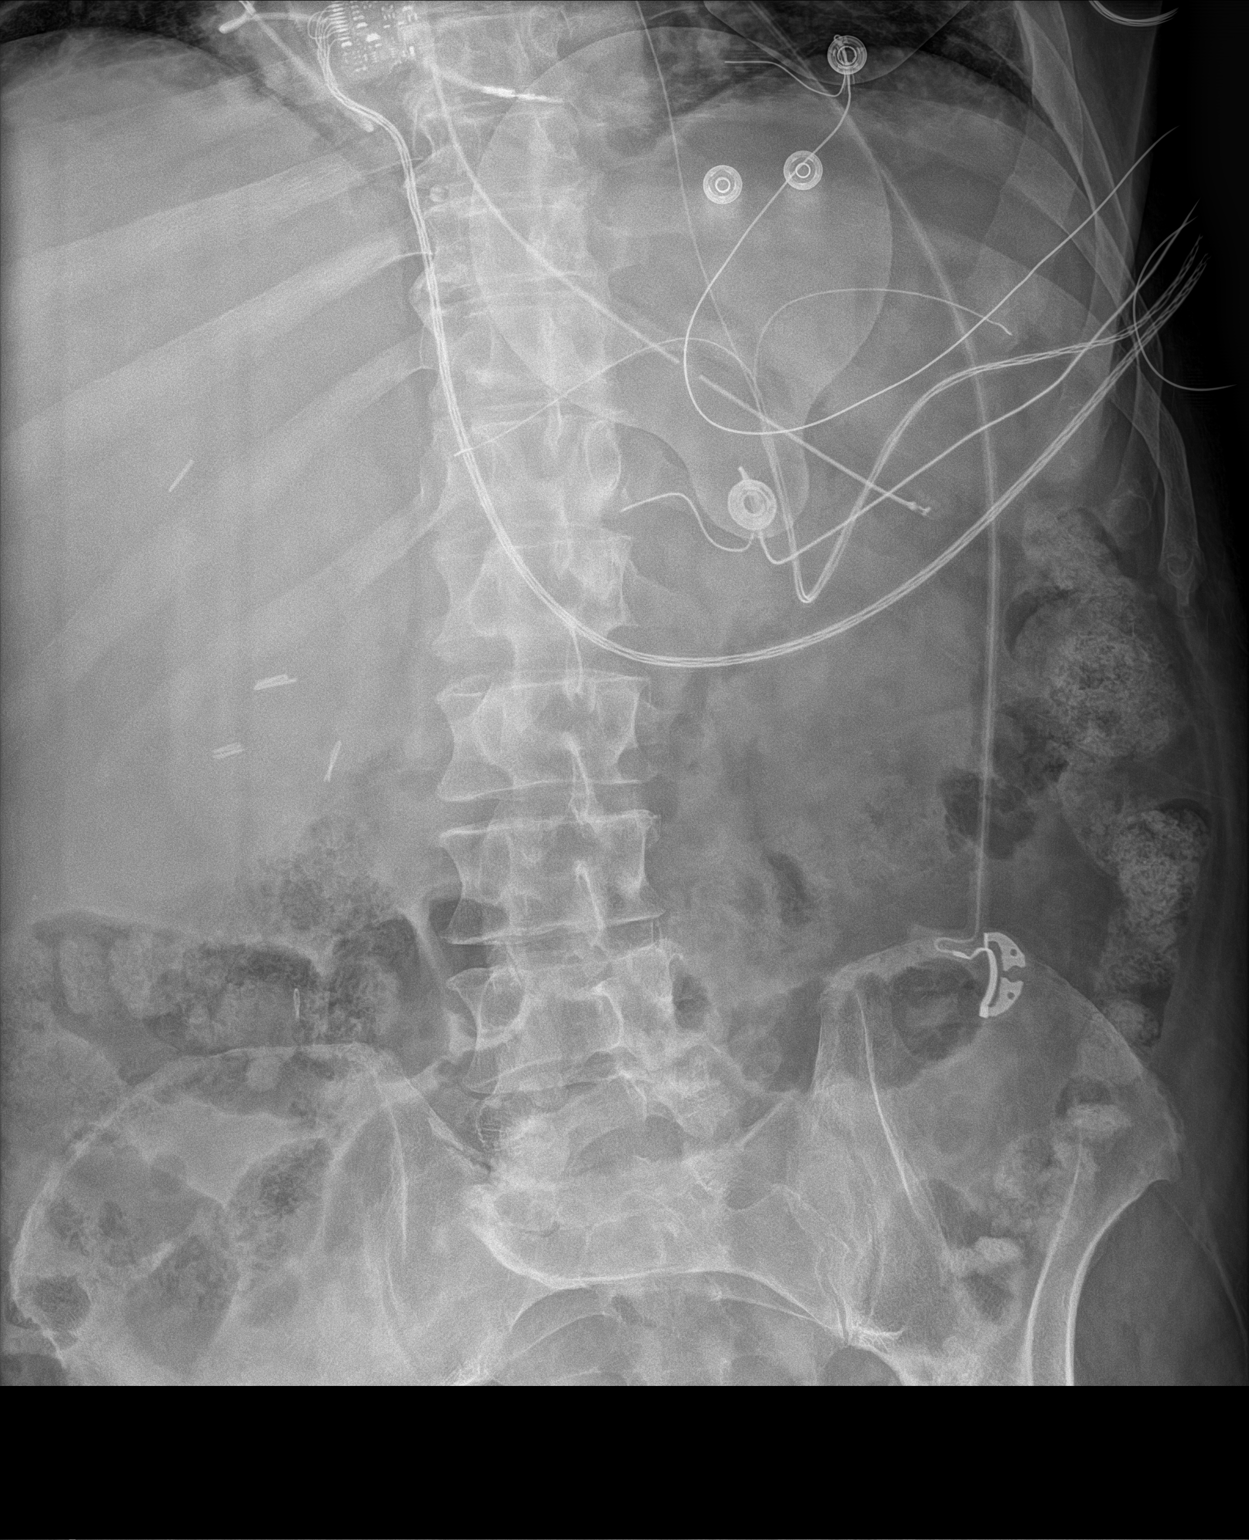

[1 of 1 positions shown; findings below may reference images not displayed]

FINDINGS: An orogastric tube has been placed. The proximal port as well as the
tip project below the expected location of the GE junction. The tip
lies in the mid gastric body. The bowel gas pattern is within the
limits of normal. There are numerous surgical clips in the right mid
abdomen. There is contrast within the stool in the descending colon
and rectosigmoid.
IMPRESSION: Adequate positioning of the orogastric tube.
